# Patient Record
Sex: Male | Born: 1945 | Race: White | Hispanic: No | Smoking: Former smoker
Health system: Southern US, Community
[De-identification: ages and names within clinical notes are randomized; demographics above are authoritative.]

## PROBLEM LIST (undated history)

## (undated) DIAGNOSIS — Z7982 Long term (current) use of aspirin: Secondary | ICD-10-CM

## (undated) DIAGNOSIS — G894 Chronic pain syndrome: Secondary | ICD-10-CM

## (undated) DIAGNOSIS — I252 Old myocardial infarction: Secondary | ICD-10-CM

## (undated) DIAGNOSIS — I2510 Atherosclerotic heart disease of native coronary artery without angina pectoris: Secondary | ICD-10-CM

## (undated) DIAGNOSIS — E119 Type 2 diabetes mellitus without complications: Secondary | ICD-10-CM

## (undated) DIAGNOSIS — I214 Non-ST elevation (NSTEMI) myocardial infarction: Secondary | ICD-10-CM

## (undated) DIAGNOSIS — E1142 Type 2 diabetes mellitus with diabetic polyneuropathy: Secondary | ICD-10-CM

## (undated) DIAGNOSIS — M179 Osteoarthritis of knee, unspecified: Secondary | ICD-10-CM

## (undated) DIAGNOSIS — R0609 Other forms of dyspnea: Secondary | ICD-10-CM

## (undated) DIAGNOSIS — G4730 Sleep apnea, unspecified: Secondary | ICD-10-CM

## (undated) DIAGNOSIS — E785 Hyperlipidemia, unspecified: Secondary | ICD-10-CM

## (undated) DIAGNOSIS — Z955 Presence of coronary angioplasty implant and graft: Secondary | ICD-10-CM

## (undated) DIAGNOSIS — Z9889 Other specified postprocedural states: Secondary | ICD-10-CM

## (undated) DIAGNOSIS — L0390 Cellulitis, unspecified: Secondary | ICD-10-CM

## (undated) DIAGNOSIS — Z8489 Family history of other specified conditions: Secondary | ICD-10-CM

## (undated) DIAGNOSIS — M109 Gout, unspecified: Secondary | ICD-10-CM

## (undated) DIAGNOSIS — K631 Perforation of intestine (nontraumatic): Secondary | ICD-10-CM

## (undated) DIAGNOSIS — L600 Ingrowing nail: Secondary | ICD-10-CM

## (undated) DIAGNOSIS — K219 Gastro-esophageal reflux disease without esophagitis: Secondary | ICD-10-CM

## (undated) DIAGNOSIS — I255 Ischemic cardiomyopathy: Secondary | ICD-10-CM

## (undated) DIAGNOSIS — Z8679 Personal history of other diseases of the circulatory system: Secondary | ICD-10-CM

## (undated) DIAGNOSIS — K269 Duodenal ulcer, unspecified as acute or chronic, without hemorrhage or perforation: Secondary | ICD-10-CM

## (undated) DIAGNOSIS — I10 Essential (primary) hypertension: Secondary | ICD-10-CM

## (undated) DIAGNOSIS — M7580 Other shoulder lesions, unspecified shoulder: Secondary | ICD-10-CM

## (undated) DIAGNOSIS — R002 Palpitations: Secondary | ICD-10-CM

## (undated) DIAGNOSIS — R112 Nausea with vomiting, unspecified: Secondary | ICD-10-CM

## (undated) DIAGNOSIS — I6523 Occlusion and stenosis of bilateral carotid arteries: Secondary | ICD-10-CM

## (undated) DIAGNOSIS — Z87891 Personal history of nicotine dependence: Secondary | ICD-10-CM

## (undated) DIAGNOSIS — I5020 Unspecified systolic (congestive) heart failure: Secondary | ICD-10-CM

## (undated) DIAGNOSIS — Z789 Other specified health status: Secondary | ICD-10-CM

## (undated) DIAGNOSIS — T8859XA Other complications of anesthesia, initial encounter: Secondary | ICD-10-CM

## (undated) DIAGNOSIS — N183 Chronic kidney disease, stage 3 unspecified: Secondary | ICD-10-CM

## (undated) DIAGNOSIS — I493 Ventricular premature depolarization: Secondary | ICD-10-CM

## (undated) DIAGNOSIS — M1990 Unspecified osteoarthritis, unspecified site: Secondary | ICD-10-CM

## (undated) DIAGNOSIS — I509 Heart failure, unspecified: Secondary | ICD-10-CM

## (undated) DIAGNOSIS — I491 Atrial premature depolarization: Secondary | ICD-10-CM

## (undated) DIAGNOSIS — J449 Chronic obstructive pulmonary disease, unspecified: Secondary | ICD-10-CM

## (undated) DIAGNOSIS — M51369 Other intervertebral disc degeneration, lumbar region without mention of lumbar back pain or lower extremity pain: Secondary | ICD-10-CM

## (undated) DIAGNOSIS — M961 Postlaminectomy syndrome, not elsewhere classified: Secondary | ICD-10-CM

## (undated) HISTORY — PX: APPENDECTOMY: SHX54

## (undated) HISTORY — PX: TONSILLECTOMY: SUR1361

## (undated) HISTORY — PX: CORONARY ANGIOPLASTY WITH STENT PLACEMENT: SHX49

## (undated) HISTORY — PX: BACK SURGERY: SHX140

## (undated) HISTORY — PX: COLONOSCOPY: SHX174

## (undated) HISTORY — PX: ABDOMINAL SURGERY: SHX537

## (undated) HISTORY — PX: CHOLECYSTECTOMY: SHX55

---

## 2000-06-22 DIAGNOSIS — I2510 Atherosclerotic heart disease of native coronary artery without angina pectoris: Secondary | ICD-10-CM

## 2000-06-22 HISTORY — DX: Atherosclerotic heart disease of native coronary artery without angina pectoris: I25.10

## 2000-06-22 HISTORY — PX: CORONARY ANGIOPLASTY WITH STENT PLACEMENT: SHX49

## 2005-03-20 ENCOUNTER — Emergency Department: Payer: Self-pay | Admitting: Emergency Medicine

## 2011-02-08 DIAGNOSIS — E7800 Pure hypercholesterolemia, unspecified: Secondary | ICD-10-CM | POA: Insufficient documentation

## 2014-08-07 ENCOUNTER — Emergency Department: Payer: Self-pay | Admitting: Internal Medicine

## 2014-09-12 ENCOUNTER — Ambulatory Visit: Payer: Self-pay | Admitting: General Practice

## 2014-09-13 LAB — HEMOGLOBIN A1C: Hemoglobin A1C: 6.1 % — ABNORMAL HIGH

## 2014-09-18 DIAGNOSIS — E782 Mixed hyperlipidemia: Secondary | ICD-10-CM | POA: Diagnosis present

## 2014-09-19 DIAGNOSIS — I2510 Atherosclerotic heart disease of native coronary artery without angina pectoris: Secondary | ICD-10-CM | POA: Diagnosis present

## 2014-09-19 DIAGNOSIS — I10 Essential (primary) hypertension: Secondary | ICD-10-CM | POA: Insufficient documentation

## 2014-09-20 LAB — URINE CULTURE

## 2014-09-24 ENCOUNTER — Inpatient Hospital Stay
Admit: 2014-09-24 | Disposition: A | Discharge status: Home / Self Care | Payer: Self-pay | Attending: General Practice | Admitting: General Practice

## 2014-09-25 LAB — BASIC METABOLIC PANEL
Anion Gap: 6 — ABNORMAL LOW (ref 7–16)
BUN: 19 mg/dL
CREATININE: 1.23 mg/dL
Calcium, Total: 7.9 mg/dL — ABNORMAL LOW
Chloride: 103 mmol/L
Co2: 27 mmol/L
EGFR (African American): >60
GFR CALC NON AF AMER: 59 — AB
Glucose: 169 mg/dL — ABNORMAL HIGH
POTASSIUM: 4.1 mmol/L
Sodium: 136 mmol/L

## 2014-09-25 LAB — PLATELET COUNT: PLATELETS: 148 10*3/uL — AB (ref 150–440)

## 2014-09-25 LAB — HEMOGLOBIN: HGB: 12.8 g/dL — AB (ref 13.0–18.0)

## 2014-09-26 ENCOUNTER — Encounter
Admit: 2014-09-26 | Disposition: A | Discharge status: Home / Self Care | Payer: Self-pay | Attending: Internal Medicine | Admitting: Internal Medicine

## 2014-09-26 LAB — BASIC METABOLIC PANEL
Anion Gap: 8 (ref 7–16)
BUN: 11 mg/dL
CHLORIDE: 99 mmol/L — AB
CO2: 26 mmol/L
CREATININE: 0.96 mg/dL
Calcium, Total: 8.1 mg/dL — ABNORMAL LOW
EGFR (African American): >60
EGFR (Non-African Amer.): >60
Glucose: 162 mg/dL — ABNORMAL HIGH
Potassium: 3.8 mmol/L
Sodium: 133 mmol/L — ABNORMAL LOW

## 2014-09-26 LAB — HEMOGLOBIN: HGB: 12.1 g/dL — ABNORMAL LOW (ref 13.0–18.0)

## 2014-09-26 LAB — PLATELET COUNT: Platelet: 146 10*3/uL — ABNORMAL LOW (ref 150–440)

## 2014-10-18 LAB — CBC
HCT: 43.4 % (ref 40.0–52.0)
HGB: 14.5 g/dL (ref 13.0–18.0)
MCH: 29.6 pg (ref 26.0–34.0)
MCHC: 33.4 g/dL (ref 32.0–36.0)
MCV: 89 fL (ref 80–100)
Platelet: 211 10*3/uL (ref 150–440)
RBC: 4.9 10*6/uL (ref 4.40–5.90)
RDW: 14.7 % — ABNORMAL HIGH (ref 11.5–14.5)
WBC: 6.2 10*3/uL (ref 3.8–10.6)

## 2014-10-18 LAB — URINALYSIS, COMPLETE
Bacteria: NEGATIVE
Bilirubin,UR: NEGATIVE
Blood: NEGATIVE
Glucose,UR: NEGATIVE mg/dL (ref 0–75)
KETONE: NEGATIVE
Leukocyte Esterase: NEGATIVE
NITRITE: NEGATIVE
PROTEIN: NEGATIVE
Ph: 5 (ref 4.5–8.0)
SPECIFIC GRAVITY: 1.019 (ref 1.003–1.030)

## 2014-10-18 LAB — BASIC METABOLIC PANEL
Anion Gap: 10 (ref 7–16)
BUN: 20 mg/dL
CHLORIDE: 103 mmol/L
Calcium, Total: 9.1 mg/dL
Co2: 24 mmol/L
Creatinine: 1.26 mg/dL — ABNORMAL HIGH
EGFR (African American): >60
GFR CALC NON AF AMER: 57 — AB
Glucose: 183 mg/dL — ABNORMAL HIGH
POTASSIUM: 3.9 mmol/L
Sodium: 137 mmol/L

## 2014-10-18 LAB — APTT: Activated PTT: 33.2 secs (ref 23.6–35.9)

## 2014-10-18 LAB — SEDIMENTATION RATE: Erythrocyte Sed Rate: 2 mm/hr (ref 0–20)

## 2014-10-18 LAB — PROTIME-INR
INR: 1
Prothrombin Time: 13.6 secs

## 2014-10-18 LAB — MRSA PCR SCREENING

## 2014-10-21 NOTE — Op Note (Signed)
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   PROCEDURE PERFORMED: Left total knee arthroplasty using computer-assisted navigation.   SURGEON: Francesco SorJames Hooten, M.D.   ASSISTANT: Van ClinesJon Wolfe, PA (required to maintain retraction throughout the procedure).   ANESTHESIA: Spinal.   ESTIMATED BLOOD LOSS: 50 mL.   FLUIDS REPLACED: 1500 mL of crystalloid.   TOURNIQUET TIME: 82 minutes.   DRAINS: Two medium drains to reinfusion system.   SOFT TISSUE RELEASES: Anterior cruciate ligament, posterior cruciate ligament, deep medial collateral ligament and patellofemoral ligament.   IMPLANTS UTILIZED: DePuy PFC Sigma size 4 posterior stabilized femoral component (cemented), size 5 MBT tibial component (cemented), 38 mm three-peg oval dome patella (cemented), and a 10 mm stabilized rotating platform polyethylene insert.   INDICATIONS FOR SURGERY: The patient is a 69 year old male who has been seen for complaints of progressive left knee pain. X-rays demonstrated severe degenerative changes in tricompartmental fashion with relative varus deformity. After discussion of the risks and benefits of surgical intervention, the patient expressed understanding of the risks and benefits and agreed with plans for surgical intervention.   PROCEDURE IN DETAIL: The patient was brought into the operating room and, after adequate spinal anesthesia was achieved, a tourniquet was placed on the patient's upper left thigh. The patient's left knee and leg were cleaned and prepped with alcohol and DuraPrep and draped in the usual sterile fashion. A "timeout" was performed as per usual protocol. The left lower extremity was exsanguinated using an Esmarch, and the tourniquet was inflated to 300 mmHg. An anterior  longitudinal incision was made followed by a standard mid vastus approach. A small effusion was evacuated. The deep fibers of the medial collateral ligament were elevated in subperiosteal fashion off the medial flare of the tibia so as to maintain a continuous soft tissue sleeve. The patella was subluxed laterally and the patellofemoral ligament was incised. Inspection of the knee demonstrated severe degenerative changes with areas of full-thickness loss of articular cartilage to the medial compartment. Prominent osteophytes were debrided using a rongeur. Anterior and posterior cruciate ligaments were excised. Two 4.0 mm Schanz pins were inserted into the femur and into the tibia for attachment of the array of trackers used for computer-assisted navigation. Hip center was identified using the circumduction technique. Distal landmarks were mapped using the computer. The distal femur and proximal tibia were mapped using the computer. Distal femoral cutting guide was positioned using computer-assisted navigation so as to achieve a 5 degree distal valgus cut. Cut was performed and verified using the computer. Distal femur was sized and it was felt that a size 4 femoral component was appropriate. A size 4 cutting guide was positioned and the anterior cut was performed and verified using the computer. This was followed by completion of posterior and chamfer cuts. Femoral cutting guide for the central box was then positioned and the central box cut was performed. Attention was then directed to the proximal tibia. Medial and lateral menisci were excised. The extramedullary tibial cutting guide was positioned using computer-assisted navigation so as to achieve a 0 degree varus valgus alignment and 0 degree posterior slope. Cut was performed and verified using the computer. The proximal tibia was sized and it was felt that a size 5 tibial tray was appropriate. Tibial and femoral trials were inserted followed by insertion  of a  10 mm polyethylene insert. This allowed for excellent mediolateral soft tissue balancing both in flexion and in full extension. Finally, the patella was cut and prepared so as to accommodate a 38 mm three-peg oval dome patella. Patellar trial was placed and the knee was placed through a range of motion with excellent patellar tracking appreciated. The femoral trial was removed after debridement of posterior osteophytes. Central post hole for the tibial component was reamed followed by insertion of a keel punch. Tibial trial was then removed. The cut surfaces of bone were irrigated with copious amounts of normal saline with antibiotic solution using pulsatile lavage and then suctioned dry. Polymethylmethacrylate cement with gentamicin was prepared in the usual fashion using a vacuum mixer. Cement was applied to the cut surface of the proximal tibia as well as along the undersurface of a size 5 MBT tibial component. The tibial component was positioned and impacted into place. Excess cement was removed using freer elevators. Cement was then applied to the cut surface of the femur as well as on the posterior flanges of a size 4 posterior stabilized femoral component. Femoral component was positioned and impacted into place. Excess cement was removed using freer elevators. A 10 mm polyethylene trial was inserted and the knee was brought into full extension with steady axial compression applied. Finally, cement was applied to the backside of a 38 mm three-peg oval dome patella, and the patellar component was positioned and patellar clamp applied. Excess cement was removed using freer elevators.   After adequate curing of the cement, the tourniquet was deflated after total tourniquet time of 82 minutes. Hemostasis was achieved using electrocautery. The knee was irrigated with copious amounts of normal saline with antibiotic solution using pulsatile lavage and then suctioned dry. The knee was inspected for any residual  cement debris. Then, 20 mL of 1.3% Exparel in 40 mL of normal saline was injected along the posterior capsule, medial and lateral gutters, and along the arthrotomy site. A 10 mm stabilized rotating platform polyethylene insert was inserted and the knee was placed through a range of motion with excellent patellar tracking appreciated and excellent mediolateral soft tissue tracking appreciated, both in flexion and in full extension. Two medium drains were placed in the wound bed and brought out through a separate stab incision to be attached to a reinfusion system. The medial parapatellar portion of the incision was reapproximated using interrupted sutures of #1 Vicryl. The subcutaneous tissue was injected with 30 mL of 0.25% Marcaine with epinephrine. The subcutaneous tissue was then reapproximated in layers using first #0 Vicryl followed by #2-0 Vicryl. Skin was closed with skin staples. A sterile dressing was applied.   The patient tolerated the procedure well. He was transported to the recovery room in stable condition.   ____________________________ Illene Labrador. Angie Fava., MD jph:sb D: 09/24/2014 11:06:14 ET T: 09/24/2014 11:25:41 ET JOB#: [id number]  cc: Fayrene Fearing P. Angie Fava., MD, <Dictator> JAMES P Angie Fava MD ELECTRONICALLY SIGNED 09/29/2014 10:02

## 2014-10-21 NOTE — Discharge Summary (Signed)
PATIENT NAME:  Aaron [name], Aaron [name] MR#:  [id number] DATE OF BIRTH:  06/07/1946  DATE OF ADMISSION:  09/24/2014 DATE OF DISCHARGE:  09/27/2014  ADMITTING DIAGNOSIS: Degenerative arthrosis of the left knee.   DISCHARGE DIAGNOSIS: Degenerative arthrosis of the right knee.   HISTORY: The patient is a 69 year old gentleman who has been followed at [facility] for progression of left knee pain. The patient reported a several year history of bilateral knee pain with the left being more symptomatic than the right. He had not seen any significant improvement in his condition despite anti-inflammatory medications, intra-articular cortisone injections, as well as a series of Synvisc injections. The patient localized most of the pain along the medial aspect of the knee. His pain was aggravated with weight-bearing activities. The patient also reported some startup stiffness as well as activity-related swelling. At the time of surgery, he was not using any ambulatory aid. The patient states that the pain had increased to the point that it was significantly interfering with his activities of daily living. X-rays taken in [facility] showed narrowing of the medial cartilage space with bone-on-bone articulation being noted. He was also noted to be in a slight varus alignment. Osteophyte and subchondral sclerosis were noted. After discussion of the risks and benefits of surgical intervention, the patient expressed his understanding of the risks and benefits and agreed for surgical intervention.   PROCEDURE: Left total knee arthroplasty using computer-assisted navigation.   ANESTHESIA: Spinal.   SOFT TISSUE RELEASE: Anterior cruciate ligament, posterior cruciate ligament, deep medial collateral ligaments, as well as patellofemoral ligament.   IMPLANTS UTILIZED:  DePuy PFC Sigma size 4 posterior stabilized femoral component, size 5 MBT tibial component (cemented), 38 mm 3 pegged oval dome patella (cemented), and a  10 mm stabilized rotating platform polyethylene insert.   HOSPITAL COURSE: The patient tolerated the procedure very well. He had no complications. He was then taken to the PACU where he was stabilized and then transferred to the orthopedic floor. He began receiving anticoagulation therapy of Lovenox 30 mg subcutaneous q. 12 hours per anesthesia and pharmacy protocol. He was treated with TED stockings bilaterally. These were allowed to be removed 1 hour per 8 hour shift. The left one was final day 2 following removal of Hemovac and dressing change. The patient was also fitted with AVI compression foot pumps bilaterally set at 80 mmHg.  His calves have been nontender. There has been no evidence of any DVTs. Negative Homan sign. Heels were elevated off the bed using rolled towels.   The patient has denied any chest pains or shortness of breath. Vital signs have been stable. He has been afebrile. Hemodynamically, he was stable and no transfusions were given other than the Autovac transfusion given the first 6 hours postoperatively.   Physical therapy was initiated on day 1 for gait training and transfers. He has done very well. Upon being discharged, he was ambulating greater than 200 feet. He was independent with bed to chair transfers.  Was able to go up 4 steps.  Occupational therapy was also initiated on day 1 for activities of daily living and assistive devices.   The patient's IV, Foley, and Hemovac were discontinued on day 2 along with a dressing change. The wound was free of any drainage or signs of infection. Polar Care was reapplied to the surgical leg maintaining a temperature of 40 to 50 degrees Fahrenheit.   The patient is being discharged to home in improved stable condition. He may continue weightbearing  as tolerated. Continue using a rolling walker until cleared by physical therapy to go to a quad cane. He was instructed in elevation of the lower extremity. He is to continue with TED stockings  bilaterally. These are to be worn during the day, but may be removed at night. Recommend that he continue using incentive spirometer every 1 hour while awake. Encourage cough and deep breathing every 2 hours while awake. He is placed on an ADA diet. Continue Polar Care. Recommend wearing this 24 hours a day as much as he can for the first 2 weeks. He is to maintain a temperature of 40 to 50 degrees Fahrenheit. He was instructed on wound care. [facility] on April 19 at 9:15. Call sooner if any complications or any fevers over 101.5.   The patient may resume his regular medication he was on prior to admission. He was given a prescription for Roxicodone 5 to 10 mg every 4 to 6 hours p.r.n. for pain, tramadol 50 to 100 mg every 4 to 6 hours p.r.n. for pain and Lovenox 40 mg subcutaneously daily for 14 days, then discontinue and begin taking 181 mg enteric-coated aspirin per day.   PAST MEDICAL HISTORY: Gout, gastroesophageal reflux disease, hyperlipidemia. Hypertension, diabetes type 2, coronary artery disease.   ____________________________ Van ClinesJon Hailea Eaglin, PA jrw:sp D: 09/27/2014 07:47:40 ET T: 09/27/2014 11:54:18 ET JOB#: [id number]  cc: Van ClinesJon Brolin Dambrosia, PA, <Dictator> Authur Cubit PA ELECTRONICALLY SIGNED 09/29/2014 9:50

## 2015-02-24 ENCOUNTER — Emergency Department
Admission: EM | Admit: 2015-02-24 | Discharge: 2015-02-24 | Disposition: A | Discharge status: Home / Self Care | Payer: Medicare Other | Attending: Emergency Medicine | Admitting: Emergency Medicine

## 2015-02-24 ENCOUNTER — Encounter: Payer: Self-pay | Admitting: Adult Health

## 2015-02-24 ENCOUNTER — Emergency Department: Payer: Medicare Other

## 2015-02-24 DIAGNOSIS — E119 Type 2 diabetes mellitus without complications: Secondary | ICD-10-CM | POA: Diagnosis not present

## 2015-02-24 DIAGNOSIS — Z87891 Personal history of nicotine dependence: Secondary | ICD-10-CM | POA: Insufficient documentation

## 2015-02-24 DIAGNOSIS — R55 Syncope and collapse: Secondary | ICD-10-CM | POA: Insufficient documentation

## 2015-02-24 DIAGNOSIS — Z79899 Other long term (current) drug therapy: Secondary | ICD-10-CM | POA: Diagnosis not present

## 2015-02-24 DIAGNOSIS — R42 Dizziness and giddiness: Secondary | ICD-10-CM

## 2015-02-24 HISTORY — DX: Old myocardial infarction: I25.2

## 2015-02-24 HISTORY — DX: Type 2 diabetes mellitus without complications: E11.9

## 2015-02-24 LAB — URINALYSIS COMPLETE WITH MICROSCOPIC (ARMC ONLY)
Bacteria, UA: NONE SEEN
Bilirubin Urine: NEGATIVE
Glucose, UA: NEGATIVE mg/dL
Hgb urine dipstick: NEGATIVE
Ketones, ur: NEGATIVE mg/dL
Leukocytes, UA: NEGATIVE
Nitrite: NEGATIVE
PH: 5 (ref 5.0–8.0)
PROTEIN: NEGATIVE mg/dL
RBC / HPF: NONE SEEN RBC/hpf (ref 0–5)
SPECIFIC GRAVITY, URINE: 1.011 (ref 1.005–1.030)
Squamous Epithelial / LPF: NONE SEEN

## 2015-02-24 LAB — BASIC METABOLIC PANEL
ANION GAP: 5 (ref 5–15)
BUN: 21 mg/dL — ABNORMAL HIGH (ref 6–20)
CHLORIDE: 107 mmol/L (ref 101–111)
CO2: 25 mmol/L (ref 22–32)
Calcium: 9.2 mg/dL (ref 8.9–10.3)
Creatinine, Ser: 1.14 mg/dL (ref 0.61–1.24)
GFR calc non Af Amer: >60 mL/min (ref >60)
GLUCOSE: 211 mg/dL — AB (ref 65–99)
Potassium: 4 mmol/L (ref 3.5–5.1)
Sodium: 137 mmol/L (ref 135–145)

## 2015-02-24 LAB — CBC
HEMATOCRIT: 41 % (ref 40.0–52.0)
HEMOGLOBIN: 14 g/dL (ref 13.0–18.0)
MCH: 29.4 pg (ref 26.0–34.0)
MCHC: 34 g/dL (ref 32.0–36.0)
MCV: 86.5 fL (ref 80.0–100.0)
Platelets: 179 10*3/uL (ref 150–440)
RBC: 4.75 MIL/uL (ref 4.40–5.90)
RDW: 15.6 % — ABNORMAL HIGH (ref 11.5–14.5)
WBC: 6.7 10*3/uL (ref 3.8–10.6)

## 2015-02-24 LAB — TROPONIN I
Troponin I: 0.03 ng/mL (ref <0.031)
Troponin I: <0.03 ng/mL (ref <0.031)

## 2015-02-24 LAB — GLUCOSE, CAPILLARY: Glucose-Capillary: 211 mg/dL — ABNORMAL HIGH (ref 65–99)

## 2015-02-24 NOTE — ED Notes (Addendum)
Presents with dizziness and weakness for the past 2-3 days-per EMS CBG was 240. Pt states, "I don't feel right, I got a little headache, I felt good when I woke up this AM, I fell asleep in a recliner and when I got up and got dressed, and when I bent over to put my shoes on everything went off balance, I felt like I was going to pass out and I felt sick. I got to feeling a little better and got my shoes on, started going to my sons house and made it halfway before I got dizzy and light headed and nauseated. If I am sitting still and not moving its not bad, but if I move just a little bit I feel dizzy" denies pain, denies SOB, endorses nausea, light headedness and diaphoresis. -CBG here 211, he states it normally runs around 150-160

## 2015-02-24 NOTE — ED Provider Notes (Signed)
[facility] Emergency Department Provider Note  ____________________________________________  Time seen: Approximately 12:30 PM  I have reviewed the triage vital signs and the nursing notes.   HISTORY  Chief Complaint Near Syncope    HPI Aaron Maden. is a 69 y.o. male he should return reports he bent over to tie his shoes and then became all lightheaded dizzy and sweaty. This continued on for some time getting worse every time he moves it would improve if he did not move His head still. Patient reports he did something similar to this several years ago was called in her ear problem. He says however that he did not get sweaty when he had this happened. He had nausea along with it but no vomiting. He did not have any chest tightness shortness of breath or other symptoms. At the present time he feels back to normal except for occasionally when he stands up or moves he gets a little lightheaded or dizzy just for a second or less   Past Medical History  Diagnosis Date  . MI, old   . Diabetes mellitus without complication    patient reports his family doctors take him off of all diabetes medicines as his hemoglobin A1c has been 6 following a diet changes  There are no active problems to display for this patient.   Past Surgical History  Procedure Laterality Date  . Coronary angioplasty with stent placement    . Abdominal surgery     past surgical history includes back surgery for ruptured disc in the abdominal surgery was for "ruptured stomach"  Current Outpatient Rx  Name  Route  Sig  Dispense  Refill  . allopurinol (ZYLOPRIM) 100 MG tablet   Oral   Take 100 mg by mouth daily as needed. For gout flare up.      11   . augmented betamethasone dipropionate (DIPROLENE-AF) 0.05 % cream   Topical   Apply 1 application topically 2 (two) times daily as needed. For rawness behind ears.      2   . Flaxseed MISC   Oral   Take 2,400 mg by mouth every  other day.         . indomethacin (INDOCIN) 50 MG capsule   Oral   Take 50 mg by mouth daily as needed. For gout flare up.         Marland Kitchen ketoconazole (NIZORAL) 2 % shampoo   Topical   Apply 1 application topically See admin instructions. Apply shampoo 2 to 3 times a week as needed. Leave on 5-10 minutes.      5   . nitroGLYCERIN (NITROSTAT) 0.4 MG SL tablet   Sublingual   Place 0.4 mg under the tongue every 5 (five) minutes x 3 doses as needed. For chest pain. If no relief Call md or go to emergency room.         Marland Kitchen omeprazole (PRILOSEC) 20 MG capsule   Oral   Take 20 mg by mouth daily as needed. When taking Indomethacin or as needed for gerd.      3   . traMADol (ULTRAM) 50 MG tablet   Oral   Take 50 mg by mouth 2 (two) times daily as needed. for pain      3     Allergies Statins and Sulfa antibiotics  History reviewed. No pertinent family history.  Social History Social History  Substance Use Topics  . Smoking status: Former Games developer  . Smokeless tobacco: None  .  Alcohol Use: No    Review of Systems Constitutional: No fever/chills Eyes: No visual changes. ENT: No sore throat. Cardiovascular: Denies chest pain. Respiratory: Denies shortness of breath. Gastrointestinal: No abdominal pain.  No nausea, no vomiting.  No diarrhea.  No constipation. Genitourinary: Negative for dysuria. Musculoskeletal: Negative for back pain. Skin: Negative for rash. Neurological: Negative for headaches, focal weakness or numbness.  10-point ROS otherwise negative.  ____________________________________________   PHYSICAL EXAM:  VITAL SIGNS: ED Triage Vitals  Enc Vitals Group     BP 02/24/15 1130 149/63 mmHg     Pulse Rate 02/24/15 1130 82     Resp 02/24/15 1130 16     Temp 02/24/15 1130 98.4 F (36.9 C)     Temp Source 02/24/15 1130 Oral     SpO2 02/24/15 1130 96 %     Weight --      Height --      Head Cir --      Peak Flow --      Pain Score --      Pain Loc --       Pain Edu? --      Excl. in GC? --     Constitutional: Alert and oriented. Well appearing and in no acute distress. Eyes: Conjunctivae are normal. PERRL. EOMI. Head: Atraumatic. Nose: No congestion/rhinnorhea. Mouth/Throat: Mucous membranes are moist.  Oropharynx non-erythematous. Neck: No stridor.  Cardiovascular: Normal rate, regular rhythm. Grossly normal heart sounds.  Good peripheral circulation. Respiratory: Normal respiratory effort.  No retractions. Lungs CTAB. Gastrointestinal: Soft and nontender. No distention. No abdominal bruits. No CVA tenderness. Musculoskeletal: No lower extremity tenderness nor edema.  No joint effusions. Neurologic:  Normal speech and language. No gross focal neurologic deficits are appreciated. No gait instability. Cranial nerves II through 12 are intact. Cerebellar finger to nose heel-to-shin and rapid alternating movements and hands are normal motor strength is 5 over 5 throughout sensation is intact. There is no nystagmus. Head thrust is normal Skin:  Skin is warm, dry and intact. No rash noted. Psychiatric: Mood and affect are normal. Speech and behavior are normal.  ____________________________________________   LABS (all labs ordered are listed, but only abnormal results are displayed)  Labs Reviewed  BASIC METABOLIC PANEL - Abnormal; Notable for the following:    Glucose, Bld 211 (*)    BUN 21 (*)    All other components within normal limits  CBC - Abnormal; Notable for the following:    RDW 15.6 (*)    All other components within normal limits  URINALYSIS COMPLETEWITH MICROSCOPIC ([facility] ONLY) - Abnormal; Notable for the following:    Color, Urine STRAW (*)    APPearance CLEAR (*)    All other components within normal limits  GLUCOSE, CAPILLARY - Abnormal; Notable for the following:    Glucose-Capillary 211 (*)    All other components within normal limits  TROPONIN I  TROPONIN I  CBG MONITORING, ED    ____________________________________________  EKG  KG read and interpreted by me shows normal sinus rhythm at a rate of 82 left axis essentially normal EKG ____________________________________________  RADIOLOGY No acute changes per radiology  ____________________________________________   PROCEDURES   ____________________________________________   INITIAL IMPRESSION / ASSESSMENT AND PLAN / ED COURSE  Pertinent labs & imaging results that were available during my care of the patient were reviewed by me and considered in my medical decision making (see chart for details).   ____________________________________________   FINAL CLINICAL IMPRESSION(S) / ED DIAGNOSES  Final diagnoses:  Vertigo      Arnaldo Natal, MD 02/24/15 [phone]

## 2015-02-24 NOTE — Discharge Instructions (Signed)
Dizziness °Dizziness is a common problem. It is a feeling of unsteadiness or light-headedness. You may feel like you are about to faint. Dizziness can lead to injury if you stumble or fall. A person of any age group can suffer from dizziness, but dizziness is more common in older adults. °CAUSES  °Dizziness can be caused by many different things, including: °· Middle ear problems. °· Standing for too long. °· Infections. °· An allergic reaction. °· Aging. °· An emotional response to something, such as the sight of blood. °· Side effects of medicines. °· Tiredness. °· Problems with circulation or blood pressure. °· Excessive use of alcohol or medicines, or illegal drug use. °· Breathing too fast (hyperventilation). °· An irregular heart rhythm (arrhythmia). °· A low red blood cell count (anemia). °· Pregnancy. °· Vomiting, diarrhea, fever, or other illnesses that cause body fluid loss (dehydration). °· Diseases or conditions such as Parkinson's disease, high blood pressure (hypertension), diabetes, and thyroid problems. °· Exposure to extreme heat. °DIAGNOSIS  °Your health care provider will ask about your symptoms, perform a physical exam, and perform an electrocardiogram (ECG) to record the electrical activity of your heart. Your health care provider may also perform other heart or blood tests to determine the cause of your dizziness. These may include: °· Transthoracic echocardiogram (TTE). During echocardiography, sound waves are used to evaluate how blood flows through your heart. °· Transesophageal echocardiogram (TEE). °· Cardiac monitoring. This allows your health care provider to monitor your heart rate and rhythm in real time. °· Holter monitor. This is a portable device that records your heartbeat and can help diagnose heart arrhythmias. It allows your health care provider to track your heart activity for several days if needed. °· Stress tests by exercise or by giving medicine that makes the heart beat  faster. °TREATMENT  °Treatment of dizziness depends on the cause of your symptoms and can vary greatly. °HOME CARE INSTRUCTIONS  °· Drink enough fluids to keep your urine clear or pale yellow. This is especially important in very hot weather. In older adults, it is also important in cold weather. °· Take your medicine exactly as directed if your dizziness is caused by medicines. When taking blood pressure medicines, it is especially important to get up slowly. °¨ Rise slowly from chairs and steady yourself until you feel okay. °¨ In the morning, first sit up on the side of the bed. When you feel okay, stand slowly while holding onto something until you know your balance is fine. °· Move your legs often if you need to stand in one place for a long time. Tighten and relax your muscles in your legs while standing. °· Have someone stay with you for 1-2 days if dizziness continues to be a problem. Do this until you feel you are well enough to stay alone. Have the person call your health care provider if he or she notices changes in you that are concerning. °· Do not drive or use heavy machinery if you feel dizzy. °· Do not drink alcohol. °SEEK IMMEDIATE MEDICAL CARE IF:  °· Your dizziness or light-headedness gets worse. °· You feel nauseous or vomit. °· You have problems talking, walking, or using your arms, hands, or legs. °· You feel weak. °· You are not thinking clearly or you have trouble forming sentences. It may take a friend or family member to notice this. °· You have chest pain, abdominal pain, shortness of breath, or sweating. °· Your vision changes. °· You notice   any bleeding.  You have side effects from medicine that seems to be getting worse rather than better. MAKE SURE YOU:   Understand these instructions.  Will watch your condition.  Will get help right away if you are not doing well or get worse. Document Released: 12/02/2000 Document Revised: 06/13/2013 Document Reviewed: 12/26/2010 [facility]  Patient Information 2015 [place], [place]. This information is not intended to replace advice given to you by your health care provider. Make sure you discuss any questions you have with your health care provider.   Please return if you get worse, especially if the symptoms do not resolve in 5-10 minutes.   I would like you to follow up with the cardiologist since she had that episodes of sweating and your troponin the heart enzyme had bumped up slightly before returning back to normal. Please call his office on Tuesday to set up a follow-up appointment in the next few days.

## 2015-09-30 DIAGNOSIS — I6523 Occlusion and stenosis of bilateral carotid arteries: Secondary | ICD-10-CM | POA: Insufficient documentation

## 2015-09-30 DIAGNOSIS — Z96652 Presence of left artificial knee joint: Secondary | ICD-10-CM | POA: Insufficient documentation

## 2015-10-21 ENCOUNTER — Encounter: Payer: Self-pay | Admitting: *Deleted

## 2015-10-21 ENCOUNTER — Encounter
Admission: RE | Disposition: A | Discharge status: Home / Self Care | Payer: Self-pay | Source: Ambulatory Visit | Attending: Cardiology

## 2015-10-21 ENCOUNTER — Observation Stay
Admission: RE | Admit: 2015-10-21 | Discharge: 2015-10-22 | Disposition: A | Discharge status: Home / Self Care | Payer: Medicare Other | Source: Ambulatory Visit | Attending: Cardiology | Admitting: Cardiology

## 2015-10-21 DIAGNOSIS — I10 Essential (primary) hypertension: Secondary | ICD-10-CM | POA: Diagnosis not present

## 2015-10-21 DIAGNOSIS — M109 Gout, unspecified: Secondary | ICD-10-CM | POA: Insufficient documentation

## 2015-10-21 DIAGNOSIS — E785 Hyperlipidemia, unspecified: Secondary | ICD-10-CM | POA: Diagnosis not present

## 2015-10-21 DIAGNOSIS — I25119 Atherosclerotic heart disease of native coronary artery with unspecified angina pectoris: Principal | ICD-10-CM | POA: Insufficient documentation

## 2015-10-21 DIAGNOSIS — E119 Type 2 diabetes mellitus without complications: Secondary | ICD-10-CM | POA: Insufficient documentation

## 2015-10-21 DIAGNOSIS — K219 Gastro-esophageal reflux disease without esophagitis: Secondary | ICD-10-CM | POA: Diagnosis not present

## 2015-10-21 DIAGNOSIS — Z955 Presence of coronary angioplasty implant and graft: Secondary | ICD-10-CM | POA: Insufficient documentation

## 2015-10-21 DIAGNOSIS — Z8042 Family history of malignant neoplasm of prostate: Secondary | ICD-10-CM | POA: Diagnosis not present

## 2015-10-21 DIAGNOSIS — Z96652 Presence of left artificial knee joint: Secondary | ICD-10-CM | POA: Diagnosis not present

## 2015-10-21 DIAGNOSIS — Z8379 Family history of other diseases of the digestive system: Secondary | ICD-10-CM | POA: Diagnosis not present

## 2015-10-21 DIAGNOSIS — I2510 Atherosclerotic heart disease of native coronary artery without angina pectoris: Secondary | ICD-10-CM | POA: Diagnosis present

## 2015-10-21 DIAGNOSIS — I200 Unstable angina: Secondary | ICD-10-CM | POA: Diagnosis present

## 2015-10-21 DIAGNOSIS — E7800 Pure hypercholesterolemia, unspecified: Secondary | ICD-10-CM | POA: Diagnosis not present

## 2015-10-21 DIAGNOSIS — Z9049 Acquired absence of other specified parts of digestive tract: Secondary | ICD-10-CM | POA: Insufficient documentation

## 2015-10-21 DIAGNOSIS — Z8249 Family history of ischemic heart disease and other diseases of the circulatory system: Secondary | ICD-10-CM | POA: Insufficient documentation

## 2015-10-21 DIAGNOSIS — Z87891 Personal history of nicotine dependence: Secondary | ICD-10-CM | POA: Insufficient documentation

## 2015-10-21 DIAGNOSIS — Z882 Allergy status to sulfonamides status: Secondary | ICD-10-CM | POA: Insufficient documentation

## 2015-10-21 DIAGNOSIS — Z7982 Long term (current) use of aspirin: Secondary | ICD-10-CM | POA: Diagnosis not present

## 2015-10-21 DIAGNOSIS — Z79899 Other long term (current) drug therapy: Secondary | ICD-10-CM | POA: Diagnosis not present

## 2015-10-21 DIAGNOSIS — R079 Chest pain, unspecified: Secondary | ICD-10-CM | POA: Diagnosis present

## 2015-10-21 DIAGNOSIS — Z888 Allergy status to other drugs, medicaments and biological substances status: Secondary | ICD-10-CM | POA: Insufficient documentation

## 2015-10-21 HISTORY — PX: CARDIAC CATHETERIZATION: SHX172

## 2015-10-21 HISTORY — DX: Cellulitis, unspecified: L03.90

## 2015-10-21 SURGERY — LEFT HEART CATH AND CORONARY ANGIOGRAPHY
Anesthesia: Moderate Sedation

## 2015-10-21 MED ORDER — ASPIRIN EC 325 MG PO TBEC
325.0000 mg | DELAYED_RELEASE_TABLET | Freq: Every day | ORAL | Status: DC
  Administered 2015-10-22: 325 mg via ORAL
  Filled 2015-10-21: qty 1

## 2015-10-21 MED ORDER — CLOPIDOGREL BISULFATE 75 MG PO TABS
75.0000 mg | ORAL_TABLET | Freq: Every day | ORAL | Status: DC
  Administered 2015-10-22: 75 mg via ORAL
  Filled 2015-10-21: qty 1

## 2015-10-21 MED ORDER — FENTANYL CITRATE (PF) 100 MCG/2ML IJ SOLN
INTRAMUSCULAR | Status: DC | PRN
  Administered 2015-10-21 (×2): 25 ug via INTRAVENOUS

## 2015-10-21 MED ORDER — MIDAZOLAM HCL 2 MG/2ML IJ SOLN
INTRAMUSCULAR | Status: DC | PRN
  Administered 2015-10-21 (×2): 1 mg via INTRAVENOUS

## 2015-10-21 MED ORDER — MIDAZOLAM HCL 2 MG/2ML IJ SOLN
INTRAMUSCULAR | Status: AC
  Filled 2015-10-21: qty 2

## 2015-10-21 MED ORDER — NITROGLYCERIN 1 MG/10 ML FOR IR/CATH LAB
INTRA_ARTERIAL | Status: DC | PRN
  Administered 2015-10-21 (×2): 200 ug via INTRACORONARY

## 2015-10-21 MED ORDER — IOPAMIDOL (ISOVUE-300) INJECTION 61%
INTRAVENOUS | Status: DC | PRN
  Administered 2015-10-21: 270 mL via INTRAVENOUS

## 2015-10-21 MED ORDER — CLOPIDOGREL BISULFATE 75 MG PO TABS
ORAL_TABLET | ORAL | Status: AC
  Filled 2015-10-21: qty 8

## 2015-10-21 MED ORDER — ACETAMINOPHEN 325 MG PO TABS: 650.0000 mg | ORAL_TABLET | ORAL | Status: DC | PRN

## 2015-10-21 MED ORDER — ASPIRIN 81 MG PO CHEW
CHEWABLE_TABLET | ORAL | Status: AC
  Filled 2015-10-21: qty 4

## 2015-10-21 MED ORDER — FENTANYL CITRATE (PF) 100 MCG/2ML IJ SOLN
INTRAMUSCULAR | Status: AC
  Filled 2015-10-21: qty 2

## 2015-10-21 MED ORDER — BIVALIRUDIN BOLUS VIA INFUSION - CUPID
INTRAVENOUS | Status: DC | PRN
  Administered 2015-10-21: 85.05 mg via INTRAVENOUS

## 2015-10-21 MED ORDER — BIVALIRUDIN 250 MG IV SOLR
INTRAVENOUS | Status: AC
  Filled 2015-10-21: qty 250

## 2015-10-21 MED ORDER — NITROGLYCERIN 5 MG/ML IV SOLN
INTRAVENOUS | Status: AC
  Filled 2015-10-21: qty 10

## 2015-10-21 MED ORDER — SODIUM CHLORIDE 0.9 % IV SOLN: 250.0000 mL | INTRAVENOUS | Status: DC | PRN

## 2015-10-21 MED ORDER — SODIUM CHLORIDE 0.9% FLUSH
3.0000 mL | Freq: Two times a day (BID) | INTRAVENOUS | Status: DC
  Administered 2015-10-21: 3 mL via INTRAVENOUS

## 2015-10-21 MED ORDER — ONDANSETRON HCL 4 MG/2ML IJ SOLN: 4.0000 mg | Freq: Four times a day (QID) | INTRAMUSCULAR | Status: DC | PRN

## 2015-10-21 MED ORDER — HEPARIN (PORCINE) IN NACL 2-0.9 UNIT/ML-% IJ SOLN
INTRAMUSCULAR | Status: AC
  Filled 2015-10-21: qty 500

## 2015-10-21 MED ORDER — ASPIRIN 325 MG PO TABS
ORAL_TABLET | ORAL | Status: DC | PRN
Start: 2015-10-21 — End: 2015-10-21

## 2015-10-21 MED ORDER — BIVALIRUDIN 250 MG IV SOLR
250.0000 mg | INTRAVENOUS | Status: DC | PRN
  Administered 2015-10-21: 1.75 mg/kg/h via INTRAVENOUS

## 2015-10-21 MED ORDER — SODIUM CHLORIDE 0.9 % WEIGHT BASED INFUSION: 3.0000 mL/kg/h | INTRAVENOUS | Status: AC

## 2015-10-21 MED ORDER — SODIUM CHLORIDE 0.9% FLUSH
10.0000 mL | Freq: Three times a day (TID) | INTRAVENOUS | Status: DC
  Administered 2015-10-21: 10 mL via INTRAVENOUS

## 2015-10-21 MED ORDER — ASPIRIN 81 MG PO CHEW
CHEWABLE_TABLET | ORAL | Status: DC | PRN
  Administered 2015-10-21: 324 mg via ORAL

## 2015-10-21 MED ORDER — SODIUM CHLORIDE 0.9% FLUSH: 3.0000 mL | INTRAVENOUS | Status: DC | PRN

## 2015-10-21 MED ORDER — CLOPIDOGREL BISULFATE 75 MG PO TABS
ORAL_TABLET | ORAL | Status: DC | PRN
  Administered 2015-10-21: 600 mg via ORAL

## 2015-10-21 MED ORDER — SODIUM CHLORIDE 0.9 % IV SOLN
INTRAVENOUS | Status: DC
  Administered 2015-10-21: 11:00:00 via INTRAVENOUS

## 2015-10-21 SURGICAL SUPPLY — 16 items
BALLN TREK RX 3.0X20 (BALLOONS) ×4
BALLOON TREK RX 3.0X20 (BALLOONS) ×2 IMPLANT
CATH INFINITI 5FR ANG PIGTAIL (CATHETERS) ×4 IMPLANT
CATH INFINITI 5FR JL4 (CATHETERS) ×4 IMPLANT
CATH INFINITI JR4 5F (CATHETERS) ×4 IMPLANT
CATH VISTA GUIDE 6FR JR4 (CATHETERS) ×4 IMPLANT
DEVICE CLOSURE MYNXGRIP 6/7F (Vascular Products) ×4 IMPLANT
DEVICE INFLAT 30 PLUS (MISCELLANEOUS) ×4 IMPLANT
KIT MANI 3VAL PERCEP (MISCELLANEOUS) ×4 IMPLANT
NEEDLE PERC 18GX7CM (NEEDLE) ×4 IMPLANT
PACK CARDIAC CATH (CUSTOM PROCEDURE TRAY) ×4 IMPLANT
SHEATH AVANTI 6FR X 11CM (SHEATH) ×4 IMPLANT
SHEATH PINNACLE 5F 10CM (SHEATH) ×4 IMPLANT
STENT XIENCE ALPINE RX 3.5X28 (Permanent Stent) ×4 IMPLANT
WIRE ASAHI PROWATER 180CM (WIRE) ×4 IMPLANT
WIRE EMERALD 3MM-J .035X150CM (WIRE) ×4 IMPLANT

## 2015-10-21 NOTE — Progress Notes (Signed)
Pt. admitted to unit, [id number] from cath lab[facility] Oriented to room, call bell, Ascom phones and staff. Bed in low position. Fall safety plan reviewed, yellow non-skid socks in place, bed alarm on. Full assessment to Epic; skin assessed with Leighton ParodyKathy S., RN. Telemetry box verified with tele clerk and Schuyler AmorKeon Summers NT: [id number] . Will continue to monitor.

## 2015-10-21 NOTE — Progress Notes (Signed)
[facility]  Check right groin for bleeding or hematoma.  Patient will be on bedrest for 2 hours post sheath pull---out of bed at 15:35.  Bilateral pulses are 2's DP's..Marland Kitchen

## 2015-10-22 ENCOUNTER — Encounter: Payer: Self-pay | Admitting: Internal Medicine

## 2015-10-22 DIAGNOSIS — I25119 Atherosclerotic heart disease of native coronary artery with unspecified angina pectoris: Secondary | ICD-10-CM | POA: Diagnosis not present

## 2015-10-22 LAB — CBC
HEMATOCRIT: 40.9 % (ref 40.0–52.0)
Hemoglobin: 14 g/dL (ref 13.0–18.0)
MCH: 29.7 pg (ref 26.0–34.0)
MCHC: 34.3 g/dL (ref 32.0–36.0)
MCV: 86.6 fL (ref 80.0–100.0)
PLATELETS: 186 10*3/uL (ref 150–440)
RBC: 4.73 MIL/uL (ref 4.40–5.90)
RDW: 15.1 % — AB (ref 11.5–14.5)
WBC: 10.2 10*3/uL (ref 3.8–10.6)

## 2015-10-22 LAB — BASIC METABOLIC PANEL
Anion gap: 8 (ref 5–15)
BUN: 20 mg/dL (ref 6–20)
CHLORIDE: 103 mmol/L (ref 101–111)
CO2: 28 mmol/L (ref 22–32)
CREATININE: 1.26 mg/dL — AB (ref 0.61–1.24)
Calcium: 8.9 mg/dL (ref 8.9–10.3)
GFR, EST NON AFRICAN AMERICAN: 56 mL/min — AB (ref >60)
Glucose, Bld: 150 mg/dL — ABNORMAL HIGH (ref 65–99)
POTASSIUM: 4.5 mmol/L (ref 3.5–5.1)
SODIUM: 139 mmol/L (ref 135–145)

## 2015-10-22 MED ORDER — CLOPIDOGREL BISULFATE 75 MG PO TABS: 75.0000 mg | ORAL_TABLET | Freq: Every day | ORAL | Status: DC

## 2015-10-22 NOTE — Discharge Summary (Signed)
[facility] Discharge Summary  Patient ID: Aaron Aaron [name]. MRN: [id number] DOB/AGE: 72-Jun-1947 72 y.o.  Admit date: 10/21/2015 Discharge date: 10/22/2015  Primary Discharge Diagnosis: Ischemic chest Aaron [name] I20.9 and Coronary artery disease with angina I25.119 Secondary Discharge Diagnosis high blood pressure and high cholesterol  Significant Diagnostic Studies: Cardiac cath with left ventricular angiogram and selective coronary injection as well as PCI and stent placement of right coronary artery.  Hospital Course: The patient was admitted to specials for cardiac cath with selective coronary angiogram after full consent, risk and benefits explained, and time out called with all approprate details voiced and discussed. The patient has had progressive canadian class4 angina with high probability risk stress test consistent with ischemic chest Aaron [name] and or anginal equivalent with coronary artery risk factors including high blood pressure and high cholesterol. The procedure was performed without complication and it revealed normal left ventricular function with ejection fraction of 55%.  It was found that the patient had severe 1 vessel coronary atherosclerosis with significant right coronary artery stenosis requiring further intervention. Therefore, the patient had a PCI and drug eluding stent placed without complication. The patient has been ambulating without further significant symptoms and has reached his maximal hospital benefit and will be discharged to home in good condition.  Cardiac rehabilitation has been discussed and recommended. Medication management of cardiovascular risk factors will be given post discharge and modified as an outpatient.   Discharge Exam: Blood pressure 138/60, pulse 70, temperature 98.4 F (36.9 C), temperature source Oral, resp. rate 20, height 6' (1.829 m), weight 255 lb 6.4 oz (115.849 kg), SpO2 97 %.  Constitutional: Alet oriented to person,  place, and time. No distress.  HENT: No nasal discharge.  Head: Normocephalic and atraumatic.  Eyes: Pupils are equal and round. No discharge.  Neck: Normal range of motion. Neck supple. No JVD present. No thyromegaly present.  Cardiovascular: Normal rate, regular rhythm, normal S1 S2, no gallop, no friction rub. No murmur Pulmonary/Chest: Effort normal, No stridor. No respiratory distress. no wheezes.  no rales.    Abdominal: Soft. Bowel sounds are normal.  no distension.  no tenderness. There is no rebound and no guarding.  Musculoskeletal: No edema, no cyanosis, normal pulses, no bleeding, Normal range of motion. no tenderness.  Neurological:  alert and oriented to person, place, and time. Coordination normal.  Skin: Skin is warm and dry. No rash noted. No erythema. No pallor.  Psychiatric:  normal mood and affect. behavior is normal.    Labs:   Lab Results  Component Value Date   WBC 10.2 10/22/2015   HGB 14.0 10/22/2015   HCT 40.9 10/22/2015   MCV 86.6 10/22/2015   PLT 186 10/22/2015     Recent Labs Lab 10/22/15 0441  NA 139  K 4.5  CL 103  CO2 28  BUN 20  CREATININE 1.26*  CALCIUM 8.9  GLUCOSE 150*    EKG: NSR without evidence of new changes  FOLLOW UP IN ONE TO TWO WEEKS    Medication List    STOP taking these medications        amoxicillin-clavulanate 875-125 MG tablet  Commonly known as:  AUGMENTIN     augmented betamethasone dipropionate 0.05 % cream  Commonly known as:  DIPROLENE-AF     doxycycline 100 MG EC tablet  Commonly known as:  DORYX     indomethacin 50 MG capsule  Commonly known as:  INDOCIN     ketoconazole 2 % shampoo  Commonly known as:  NIZORAL     NITROSTAT 0.4 MG SL tablet  Generic drug:  nitroGLYCERIN     omeprazole 20 MG capsule  Commonly known as:  PRILOSEC      TAKE these medications        allopurinol 100 MG tablet  Commonly known as:  ZYLOPRIM  Take 100 mg by mouth daily as needed. For gout flare up.      clopidogrel 75 MG tablet  Commonly known as:  PLAVIX  Take 1 tablet (75 mg total) by mouth daily with breakfast.     Flaxseed Misc  Take 2,400 mg by mouth every other day.     traMADol 50 MG tablet  Commonly known as:  ULTRAM  Take 50 mg by mouth 2 (two) times daily as needed. for Aaron [name]       Follow-up Information    [facility].   Specialty:  Emergency Medicine   Contact information:   [address] [id number] [place] [id number] [phone]      Follow [facility] with [facility].   Specialty:  Emergency Medicine   Contact information:   [address] [id number] [place] [id number] [phone]      Schedule an appointment as soon as possible [facility]   Why:  cath follow up      THE PATIENT  SHALL BRING ALL MEDICATIONS TO FOLLOW UP APPOINTMENT  Signed:  Lamar BlinksKOWALSKI,Haley Fuerstenberg J MD, [facility] 10/22/2015, 8:34 AM

## 2015-10-22 NOTE — Progress Notes (Signed)
Pt discharged home via wheelchair, family at bedside. IV discontinued without incident. Home medication reviewed, no question verbalized. Right groin management reviewed with patient.

## 2015-10-22 NOTE — Care Management Obs Status (Signed)
MEDICARE OBSERVATION STATUS NOTIFICATION   Patient Details  Name: Aaron PainVernon N Endres Jr. MRN: [id number] Date of Birth: 07/28/1945   Medicare Observation Status Notification Given:  Yes    Marily MemosLisa M Miesha Bachmann, RN 10/22/2015, 8:53 AM

## 2016-10-07 ENCOUNTER — Ambulatory Visit
Admission: RE | Admit: 2016-10-07 | Discharge: 2016-10-07 | Disposition: A | Discharge status: Home / Self Care | Payer: Medicare Other | Source: Ambulatory Visit | Attending: Internal Medicine | Admitting: Internal Medicine

## 2016-10-07 ENCOUNTER — Encounter
Admission: RE | Disposition: A | Discharge status: Home / Self Care | Payer: Self-pay | Source: Ambulatory Visit | Attending: Internal Medicine

## 2016-10-07 ENCOUNTER — Encounter: Payer: Self-pay | Admitting: *Deleted

## 2016-10-07 DIAGNOSIS — E7800 Pure hypercholesterolemia, unspecified: Secondary | ICD-10-CM | POA: Insufficient documentation

## 2016-10-07 DIAGNOSIS — I2510 Atherosclerotic heart disease of native coronary artery without angina pectoris: Secondary | ICD-10-CM | POA: Diagnosis not present

## 2016-10-07 DIAGNOSIS — Z955 Presence of coronary angioplasty implant and graft: Secondary | ICD-10-CM | POA: Insufficient documentation

## 2016-10-07 DIAGNOSIS — I200 Unstable angina: Secondary | ICD-10-CM | POA: Diagnosis present

## 2016-10-07 HISTORY — PX: LEFT HEART CATH AND CORONARY ANGIOGRAPHY: CATH118249

## 2016-10-07 SURGERY — LEFT HEART CATH AND CORONARY ANGIOGRAPHY
Anesthesia: Moderate Sedation | Laterality: Left

## 2016-10-07 MED ORDER — LIDOCAINE HCL (PF) 1 % IJ SOLN
INTRAMUSCULAR | Status: DC | PRN
  Administered 2016-10-07: 18 mL

## 2016-10-07 MED ORDER — FENTANYL CITRATE (PF) 100 MCG/2ML IJ SOLN
INTRAMUSCULAR | Status: DC | PRN
  Administered 2016-10-07: 25 ug via INTRAVENOUS

## 2016-10-07 MED ORDER — FENTANYL CITRATE (PF) 100 MCG/2ML IJ SOLN
INTRAMUSCULAR | Status: AC
  Filled 2016-10-07: qty 2

## 2016-10-07 MED ORDER — SODIUM CHLORIDE 0.9 % WEIGHT BASED INFUSION: 347.0000 mL/h | INTRAVENOUS | Status: DC

## 2016-10-07 MED ORDER — SODIUM CHLORIDE 0.9 % WEIGHT BASED INFUSION: 115.7000 mL/h | INTRAVENOUS | Status: DC

## 2016-10-07 MED ORDER — MIDAZOLAM HCL 2 MG/2ML IJ SOLN
INTRAMUSCULAR | Status: AC
  Filled 2016-10-07: qty 2

## 2016-10-07 MED ORDER — ASPIRIN 81 MG PO CHEW: 81.0000 mg | CHEWABLE_TABLET | ORAL | Status: DC

## 2016-10-07 MED ORDER — SODIUM CHLORIDE 0.9 % IV SOLN: 250.0000 mL | INTRAVENOUS | Status: DC | PRN

## 2016-10-07 MED ORDER — SODIUM CHLORIDE 0.9% FLUSH: 3.0000 mL | INTRAVENOUS | Status: DC | PRN

## 2016-10-07 MED ORDER — SODIUM CHLORIDE 0.9% FLUSH: 3.0000 mL | Freq: Two times a day (BID) | INTRAVENOUS | Status: DC

## 2016-10-07 MED ORDER — IOPAMIDOL (ISOVUE-300) INJECTION 61%
INTRAVENOUS | Status: DC | PRN
  Administered 2016-10-07: 130 mL via INTRA_ARTERIAL

## 2016-10-07 MED ORDER — ACETAMINOPHEN 325 MG PO TABS: 650.0000 mg | ORAL_TABLET | ORAL | Status: DC | PRN

## 2016-10-07 MED ORDER — MIDAZOLAM HCL 2 MG/2ML IJ SOLN
INTRAMUSCULAR | Status: DC | PRN
  Administered 2016-10-07: 1 mg via INTRAVENOUS

## 2016-10-07 MED ORDER — LIDOCAINE HCL (PF) 1 % IJ SOLN
INTRAMUSCULAR | Status: AC
  Filled 2016-10-07: qty 30

## 2016-10-07 MED ORDER — SODIUM CHLORIDE 0.9 % WEIGHT BASED INFUSION: 1.0000 mL/kg/h | INTRAVENOUS | Status: DC

## 2016-10-07 MED ORDER — HEPARIN (PORCINE) IN NACL 2-0.9 UNIT/ML-% IJ SOLN
INTRAMUSCULAR | Status: AC
  Filled 2016-10-07: qty 500

## 2016-10-07 MED ORDER — ONDANSETRON HCL 4 MG/2ML IJ SOLN: 4.0000 mg | Freq: Four times a day (QID) | INTRAMUSCULAR | Status: DC | PRN

## 2016-10-07 SURGICAL SUPPLY — 9 items
CATH 5FR JR4 DIAGNOSTIC (CATHETERS) ×2 IMPLANT
CATH 5FR PIGTAIL DIAGNOSTIC (CATHETERS) ×3 IMPLANT
CATH INFINITI 5FR JL4 (CATHETERS) ×3 IMPLANT
DEVICE CLOSURE MYNXGRIP 5F (Vascular Products) ×3 IMPLANT
KIT MANI 3VAL PERCEP (MISCELLANEOUS) ×3 IMPLANT
NEEDLE PERC 18GX7CM (NEEDLE) ×3 IMPLANT
PACK CARDIAC CATH (CUSTOM PROCEDURE TRAY) ×3 IMPLANT
SHEATH PINNACLE 5F 10CM (SHEATH) ×3 IMPLANT
WIRE EMERALD 3MM-J .035X150CM (WIRE) ×3 IMPLANT

## 2018-01-14 ENCOUNTER — Other Ambulatory Visit: Payer: Self-pay | Admitting: Student

## 2018-01-14 DIAGNOSIS — M7521 Bicipital tendinitis, right shoulder: Secondary | ICD-10-CM

## 2018-01-14 DIAGNOSIS — M7581 Other shoulder lesions, right shoulder: Secondary | ICD-10-CM

## 2018-01-27 ENCOUNTER — Ambulatory Visit
Admission: RE | Admit: 2018-01-27 | Discharge: 2018-01-27 | Disposition: A | Discharge status: Home / Self Care | Payer: Medicare Other | Source: Ambulatory Visit | Attending: Student | Admitting: Student

## 2018-01-27 DIAGNOSIS — M7521 Bicipital tendinitis, right shoulder: Secondary | ICD-10-CM | POA: Diagnosis not present

## 2018-01-27 DIAGNOSIS — M24811 Other specific joint derangements of right shoulder, not elsewhere classified: Secondary | ICD-10-CM | POA: Diagnosis not present

## 2018-01-27 DIAGNOSIS — M7581 Other shoulder lesions, right shoulder: Secondary | ICD-10-CM | POA: Insufficient documentation

## 2018-01-27 DIAGNOSIS — M75121 Complete rotator cuff tear or rupture of right shoulder, not specified as traumatic: Secondary | ICD-10-CM | POA: Diagnosis not present

## 2018-02-07 DIAGNOSIS — M7581 Other shoulder lesions, right shoulder: Secondary | ICD-10-CM | POA: Insufficient documentation

## 2018-02-07 NOTE — Progress Notes (Signed)
Per Leah in scheduling, [facility] changed to 8am. No answer at patient's home number. Spoke with Miguel RotaWeldon Weeks, son-in-law, who is agreeable to new arrival time and will contact patient.

## 2018-02-08 ENCOUNTER — Ambulatory Visit
Admission: RE | Admit: 2018-02-08 | Discharge: 2018-02-08 | Disposition: A | Discharge status: Home / Self Care | Payer: Medicare Other | Source: Ambulatory Visit | Attending: Surgery | Admitting: Surgery

## 2018-02-08 ENCOUNTER — Encounter
Admission: RE | Disposition: A | Discharge status: Home / Self Care | Payer: Self-pay | Source: Ambulatory Visit | Attending: Surgery

## 2018-02-08 ENCOUNTER — Encounter: Payer: Self-pay | Admitting: *Deleted

## 2018-02-08 ENCOUNTER — Encounter: Payer: Self-pay | Admitting: Anesthesiology

## 2018-02-08 ENCOUNTER — Other Ambulatory Visit: Payer: Self-pay

## 2018-02-08 DIAGNOSIS — M7521 Bicipital tendinitis, right shoulder: Secondary | ICD-10-CM | POA: Insufficient documentation

## 2018-02-08 DIAGNOSIS — Z538 Procedure and treatment not carried out for other reasons: Secondary | ICD-10-CM | POA: Diagnosis not present

## 2018-02-08 DIAGNOSIS — M7581 Other shoulder lesions, right shoulder: Secondary | ICD-10-CM | POA: Diagnosis not present

## 2018-02-08 DIAGNOSIS — M75100 Unspecified rotator cuff tear or rupture of unspecified shoulder, not specified as traumatic: Secondary | ICD-10-CM | POA: Diagnosis present

## 2018-02-08 LAB — GLUCOSE, CAPILLARY: GLUCOSE-CAPILLARY: 189 mg/dL — AB (ref 70–99)

## 2018-02-08 SURGERY — ARTHROSCOPY, SHOULDER WITH REPAIR, ROTATOR CUFF, OPEN
Anesthesia: Choice | Laterality: Right

## 2018-02-08 MED ORDER — CEFAZOLIN SODIUM-DEXTROSE 2-4 GM/100ML-% IV SOLN: 2.0000 g | Freq: Once | INTRAVENOUS | Status: DC

## 2018-02-08 MED ORDER — FAMOTIDINE 20 MG PO TABS
ORAL_TABLET | ORAL | Status: AC
  Administered 2018-02-08: 20 mg via ORAL
  Filled 2018-02-08: qty 1

## 2018-02-08 MED ORDER — FAMOTIDINE 20 MG PO TABS
20.0000 mg | ORAL_TABLET | Freq: Once | ORAL | Status: AC
  Administered 2018-02-08: 20 mg via ORAL

## 2018-02-08 MED ORDER — EPINEPHRINE PF 1 MG/ML IJ SOLN
INTRAMUSCULAR | Status: AC
  Filled 2018-02-08: qty 2

## 2018-02-08 MED ORDER — BUPIVACAINE-EPINEPHRINE (PF) 0.25% -1:200000 IJ SOLN
INTRAMUSCULAR | Status: AC
  Filled 2018-02-08: qty 30

## 2018-02-08 MED ORDER — CEFAZOLIN SODIUM-DEXTROSE 2-4 GM/100ML-% IV SOLN
INTRAVENOUS | Status: AC
  Filled 2018-02-08: qty 100

## 2018-02-08 MED ORDER — SODIUM CHLORIDE 0.9 % IV SOLN: INTRAVENOUS | Status: DC

## 2018-02-08 SURGICAL SUPPLY — 41 items
BIT DRILL JUGRKNT W/NDL BIT2.9 (DRILL) IMPLANT
BLADE FULL RADIUS 3.5 (BLADE) ×3 IMPLANT
BUR ACROMIONIZER 4.0 (BURR) ×3 IMPLANT
CANNULA SHAVER 8MMX76MM (CANNULA) ×3 IMPLANT
CHLORAPREP W/TINT 26ML (MISCELLANEOUS) ×3 IMPLANT
COVER MAYO STAND STRL (DRAPES) ×3 IMPLANT
DRAPE IMP U-DRAPE 54X76 (DRAPES) ×6 IMPLANT
DRILL JUGGERKNOT W/NDL BIT 2.9 (DRILL)
ELECT REM PT RETURN 9FT ADLT (ELECTROSURGICAL) ×3
ELECTRODE REM PT RTRN 9FT ADLT (ELECTROSURGICAL) ×1 IMPLANT
GAUZE PETRO XEROFOAM 1X8 (MISCELLANEOUS) ×3 IMPLANT
GAUZE SPONGE 4X4 12PLY STRL (GAUZE/BANDAGES/DRESSINGS) ×3 IMPLANT
GLOVE BIO SURGEON STRL SZ7.5 (GLOVE) ×6 IMPLANT
GLOVE BIO SURGEON STRL SZ8 (GLOVE) ×6 IMPLANT
GLOVE BIOGEL PI IND STRL 8 (GLOVE) ×1 IMPLANT
GLOVE BIOGEL PI INDICATOR 8 (GLOVE) ×2
GLOVE INDICATOR 8.0 STRL GRN (GLOVE) ×3 IMPLANT
GOWN STRL REUS W/ TWL LRG LVL3 (GOWN DISPOSABLE) ×1 IMPLANT
GOWN STRL REUS W/ TWL XL LVL3 (GOWN DISPOSABLE) ×1 IMPLANT
GOWN STRL REUS W/TWL LRG LVL3 (GOWN DISPOSABLE) ×2
GOWN STRL REUS W/TWL XL LVL3 (GOWN DISPOSABLE) ×2
GRASPER SUT 15 45D LOW PRO (SUTURE) IMPLANT
IV LACTATED RINGER IRRG 3000ML (IV SOLUTION) ×4
IV LR IRRIG 3000ML ARTHROMATIC (IV SOLUTION) ×2 IMPLANT
MANIFOLD NEPTUNE II (INSTRUMENTS) ×3 IMPLANT
MASK FACE SPIDER DISP (MASK) ×3 IMPLANT
MAT ABSORB  FLUID 56X50 GRAY (MISCELLANEOUS) ×2
MAT ABSORB FLUID 56X50 GRAY (MISCELLANEOUS) ×1 IMPLANT
PACK ARTHROSCOPY SHOULDER (MISCELLANEOUS) ×3 IMPLANT
SLING ARM LRG DEEP (SOFTGOODS) ×3 IMPLANT
SLING ULTRA II LG (MISCELLANEOUS) ×3 IMPLANT
STAPLER SKIN PROX 35W (STAPLE) ×3 IMPLANT
STRAP SAFETY 5IN WIDE (MISCELLANEOUS) ×3 IMPLANT
SUT ETHIBOND 0 MO6 C/R (SUTURE) ×3 IMPLANT
SUT VIC AB 2-0 CT1 27 (SUTURE) ×4
SUT VIC AB 2-0 CT1 TAPERPNT 27 (SUTURE) ×2 IMPLANT
TAPE MICROFOAM 4IN (TAPE) ×3 IMPLANT
TUBING ARTHRO INFLOW-ONLY STRL (TUBING) ×3 IMPLANT
TUBING CONNECTING 10 (TUBING) ×2 IMPLANT
TUBING CONNECTING 10' (TUBING) ×1
WAND HAND CNTRL MULTIVAC 90 (MISCELLANEOUS) ×3 IMPLANT

## 2018-02-08 NOTE — OR Nursing (Signed)
Dr. Noralyn Pickarroll concerned that the patient has not had cardiac clearance. EKG done and Dr. Philemon KingdomKowalski's office called to see if clearance could be obtained today. They report the patient would be unable to be cleared without additional testing. EKG and clearance request faxed to Dr. Philemon KingdomKowalski's office.

## 2018-02-08 NOTE — H&P (Signed)
Patient's surgery postponed pending cardiac clearance.

## 2018-02-08 NOTE — Anesthesia Preprocedure Evaluation (Deleted)
Anesthesia Evaluation  Patient identified by MRN, date of birth, ID band Patient awake    Reviewed: Allergy & Precautions, NPO status , Patient's Chart, lab work & pertinent test results  Airway        Dental   Pulmonary former smoker,           Cardiovascular + angina with exertion + CAD and + Past MI       Neuro/Psych negative neurological ROS  negative psych ROS   GI/Hepatic negative GI ROS, Neg liver ROS,   Endo/Other  diabetes  Renal/GU negative Renal ROS  negative genitourinary   Musculoskeletal   Abdominal   Peds negative pediatric ROS (+)  Hematology negative hematology ROS (+)   Anesthesia Other Findings   Reproductive/Obstetrics                             Anesthesia Physical Anesthesia Plan  ASA: III  Anesthesia Plan: General   Post-op Pain Management:    Induction: Intravenous  PONV Risk Score and Plan:   Airway Management Planned: Oral ETT  Additional Equipment:   Intra-op Plan:   Post-operative Plan: Extubation in OR  Informed Consent: I have reviewed the patients History and Physical, chart, labs and discussed the procedure including the risks, benefits and alternatives for the proposed anesthesia with the patient or authorized representative who has indicated his/her understanding and acceptance.   Dental advisory given  Plan Discussed with: CRNA and Surgeon  Anesthesia Plan Comments:         Anesthesia Quick Evaluation

## 2018-02-22 ENCOUNTER — Other Ambulatory Visit: Payer: Self-pay

## 2018-02-22 ENCOUNTER — Encounter
Admission: RE | Admit: 2018-02-22 | Discharge: 2018-02-22 | Disposition: A | Discharge status: Home / Self Care | Payer: Medicare Other | Source: Ambulatory Visit | Attending: Surgery | Admitting: Surgery

## 2018-02-22 DIAGNOSIS — Z7982 Long term (current) use of aspirin: Secondary | ICD-10-CM | POA: Diagnosis not present

## 2018-02-22 DIAGNOSIS — Z7984 Long term (current) use of oral hypoglycemic drugs: Secondary | ICD-10-CM | POA: Diagnosis not present

## 2018-02-22 DIAGNOSIS — I252 Old myocardial infarction: Secondary | ICD-10-CM | POA: Diagnosis not present

## 2018-02-22 DIAGNOSIS — Z882 Allergy status to sulfonamides status: Secondary | ICD-10-CM | POA: Diagnosis not present

## 2018-02-22 DIAGNOSIS — Z87891 Personal history of nicotine dependence: Secondary | ICD-10-CM | POA: Diagnosis not present

## 2018-02-22 DIAGNOSIS — I10 Essential (primary) hypertension: Secondary | ICD-10-CM | POA: Diagnosis not present

## 2018-02-22 DIAGNOSIS — Z955 Presence of coronary angioplasty implant and graft: Secondary | ICD-10-CM | POA: Diagnosis not present

## 2018-02-22 DIAGNOSIS — M65811 Other synovitis and tenosynovitis, right shoulder: Secondary | ICD-10-CM | POA: Diagnosis not present

## 2018-02-22 DIAGNOSIS — M7541 Impingement syndrome of right shoulder: Secondary | ICD-10-CM | POA: Diagnosis not present

## 2018-02-22 DIAGNOSIS — Z79899 Other long term (current) drug therapy: Secondary | ICD-10-CM | POA: Diagnosis not present

## 2018-02-22 DIAGNOSIS — K219 Gastro-esophageal reflux disease without esophagitis: Secondary | ICD-10-CM | POA: Diagnosis not present

## 2018-02-22 DIAGNOSIS — M75121 Complete rotator cuff tear or rupture of right shoulder, not specified as traumatic: Secondary | ICD-10-CM | POA: Diagnosis present

## 2018-02-22 DIAGNOSIS — M7581 Other shoulder lesions, right shoulder: Secondary | ICD-10-CM | POA: Diagnosis not present

## 2018-02-22 DIAGNOSIS — E785 Hyperlipidemia, unspecified: Secondary | ICD-10-CM | POA: Diagnosis not present

## 2018-02-22 DIAGNOSIS — Z7902 Long term (current) use of antithrombotics/antiplatelets: Secondary | ICD-10-CM | POA: Diagnosis not present

## 2018-02-22 DIAGNOSIS — I25119 Atherosclerotic heart disease of native coronary artery with unspecified angina pectoris: Secondary | ICD-10-CM | POA: Diagnosis not present

## 2018-02-22 DIAGNOSIS — Z888 Allergy status to other drugs, medicaments and biological substances status: Secondary | ICD-10-CM | POA: Diagnosis not present

## 2018-02-22 DIAGNOSIS — E119 Type 2 diabetes mellitus without complications: Secondary | ICD-10-CM | POA: Diagnosis not present

## 2018-02-22 LAB — COMPREHENSIVE METABOLIC PANEL
ALBUMIN: 4.1 g/dL (ref 3.5–5.0)
ALT: 23 U/L (ref 0–44)
ANION GAP: 8 (ref 5–15)
AST: 22 U/L (ref 15–41)
Alkaline Phosphatase: 75 U/L (ref 38–126)
BILIRUBIN TOTAL: 0.9 mg/dL (ref 0.3–1.2)
BUN: 21 mg/dL (ref 8–23)
CO2: 26 mmol/L (ref 22–32)
Calcium: 8.9 mg/dL (ref 8.9–10.3)
Chloride: 104 mmol/L (ref 98–111)
Creatinine, Ser: 1.32 mg/dL — ABNORMAL HIGH (ref 0.61–1.24)
GFR calc Af Amer: >60 mL/min (ref >60)
GFR, EST NON AFRICAN AMERICAN: 52 mL/min — AB (ref >60)
Glucose, Bld: 341 mg/dL — ABNORMAL HIGH (ref 70–99)
POTASSIUM: 4 mmol/L (ref 3.5–5.1)
Sodium: 138 mmol/L (ref 135–145)
TOTAL PROTEIN: 7.1 g/dL (ref 6.5–8.1)

## 2018-02-22 LAB — CBC
HCT: 39.2 % — ABNORMAL LOW (ref 40.0–52.0)
Hemoglobin: 13.7 g/dL (ref 13.0–18.0)
MCH: 31.1 pg (ref 26.0–34.0)
MCHC: 35.1 g/dL (ref 32.0–36.0)
MCV: 88.6 fL (ref 80.0–100.0)
PLATELETS: 183 10*3/uL (ref 150–440)
RBC: 4.42 MIL/uL (ref 4.40–5.90)
RDW: 15.3 % — ABNORMAL HIGH (ref 11.5–14.5)
WBC: 5.8 10*3/uL (ref 3.8–10.6)

## 2018-02-22 NOTE — Patient Instructions (Signed)
Your procedure is scheduled on: Thursday 02/24/18.  Report to DAY SURGERY DEPARTMENT LOCATED ON 2ND FLOOR MEDICAL MALL ENTRANCE. To find out your arrival time please call [phone] between 1PM - 3PM on Wednesday 02/23/18.  Remember: Instructions that are not followed completely may result in serious medical risk, up to and including death, or upon the discretion of your surgeon and anesthesiologist your surgery may need to be rescheduled.     _X__ 1. Do not eat food after midnight the night before your procedure.                 No gum chewing or hard candies. You may drink clear liquids up to 2 hours                 before you are scheduled to arrive for your surgery- DO not drink clear                 liquids within 2 hours of the start of your surgery.                 Clear Liquids include:  water, apple juice without pulp, clear carbohydrate                 drink such as Clearfast or Gatorade, Black Coffee or Tea (Do not add                 anything to coffee or tea).  __X__2.  On the morning of surgery brush your teeth with toothpaste and water, you may rinse your mouth with mouthwash if you wish.  Do not swallow any toothpaste of mouthwash.     _X__ 3.  No Alcohol for 24 hours before or after surgery.   _X__ 4.  Do Not Smoke or use e-cigarettes For 24 Hours Prior to Your Surgery.                 Do not use any chewable tobacco products for at least 6 hours prior to                 surgery.  ____  5.  Bring all medications with you on the day of surgery if instructed.   __X__  6.  Notify your doctor if there is any change in your medical condition      (cold, fever, infections).     Do not wear jewelry, make-up, hairpins, clips or nail polish. Do not wear lotions, powders, or perfumes.  Do not shave 48 hours prior to surgery. Men may shave face and neck. Do not bring valuables to the hospital.    [facility] is not responsible for any belongings or valuables.  Contacts,  dentures/partials or body piercings may not be worn into surgery. Bring a case for your contacts, glasses or hearing aids, a denture cup will be supplied. Leave your suitcase in the car. After surgery it may be brought to your room. For patients admitted to the hospital, discharge time is determined by your treatment team.   Patients discharged the day of surgery will not be allowed to drive home.   Please read over the following fact sheets that you were given:   MRSA Information  __X__ Take these medicines the morning of surgery with A SIP OF WATER:     1. traMADol (ULTRAM) 50 MG tablet IF NEEDED  2. nitroGLYCERIN (NITROSTAT) 0.4 MG SL tablet IF NEEDED  3.   4.  5.  6.  ____ Fleet Enema (as directed)   __X__ Use CHG Soap/SAGE wipes as directed  __X__ Stop Anti-inflammatories 7 days before surgery such as Advil, Ibuprofen, Motrin, BC or Goodies Powder, Naprosyn, Naproxen, Aleve, Aspirin, Meloxicam. May take Tylenol if needed for pain or discomfort.    __X__ Stop all herbal supplements, fish oil or vitamin E until after surgery.

## 2018-02-22 NOTE — Pre-Procedure Instructions (Signed)
REQUEST FOR DR CONROY TO ADDRESS GLU 341 PREOP FAXED AND SPOKE WITH JENNIFER. ALSO FYI FAXED TO DR Joice Lofts

## 2018-02-22 NOTE — Pre-Procedure Instructions (Signed)
ECG 12-lead8/26/2019 [facility] System Component Name Value Ref Range  Vent Rate (bpm) 94   PR Interval (msec) 184   QRS Interval (msec) 102   QT Interval (msec) 360   QTc (msec) 450   Other Result Information  This result has an attachment that is not available.  Result Narrative  Normal sinus rhythm Left axis deviation Abnormal ECG No previous ECGs available I reviewed and concur with this report. Electronically signed [id number] MD, Smitty Cords [phone]) on 02/15/2018 1:55:00 PM  Status Results Details   Encounter Summary

## 2018-02-23 MED ORDER — CEFAZOLIN SODIUM-DEXTROSE 2-4 GM/100ML-% IV SOLN
2.0000 g | Freq: Once | INTRAVENOUS | Status: AC
  Administered 2018-02-24: 2 g via INTRAVENOUS

## 2018-02-24 ENCOUNTER — Ambulatory Visit: Payer: Medicare Other

## 2018-02-24 ENCOUNTER — Encounter
Admission: RE | Disposition: A | Discharge status: Home / Self Care | Payer: Self-pay | Source: Ambulatory Visit | Attending: Surgery

## 2018-02-24 ENCOUNTER — Ambulatory Visit
Admission: RE | Admit: 2018-02-24 | Discharge: 2018-02-24 | Disposition: A | Discharge status: Home / Self Care | Payer: Medicare Other | Source: Ambulatory Visit | Attending: Surgery | Admitting: Surgery

## 2018-02-24 DIAGNOSIS — I25119 Atherosclerotic heart disease of native coronary artery with unspecified angina pectoris: Secondary | ICD-10-CM | POA: Insufficient documentation

## 2018-02-24 DIAGNOSIS — Z87891 Personal history of nicotine dependence: Secondary | ICD-10-CM | POA: Insufficient documentation

## 2018-02-24 DIAGNOSIS — Z888 Allergy status to other drugs, medicaments and biological substances status: Secondary | ICD-10-CM | POA: Insufficient documentation

## 2018-02-24 DIAGNOSIS — M65811 Other synovitis and tenosynovitis, right shoulder: Secondary | ICD-10-CM | POA: Insufficient documentation

## 2018-02-24 DIAGNOSIS — Z882 Allergy status to sulfonamides status: Secondary | ICD-10-CM | POA: Insufficient documentation

## 2018-02-24 DIAGNOSIS — E119 Type 2 diabetes mellitus without complications: Secondary | ICD-10-CM | POA: Insufficient documentation

## 2018-02-24 DIAGNOSIS — Z955 Presence of coronary angioplasty implant and graft: Secondary | ICD-10-CM | POA: Insufficient documentation

## 2018-02-24 DIAGNOSIS — Z7982 Long term (current) use of aspirin: Secondary | ICD-10-CM | POA: Insufficient documentation

## 2018-02-24 DIAGNOSIS — I10 Essential (primary) hypertension: Secondary | ICD-10-CM | POA: Insufficient documentation

## 2018-02-24 DIAGNOSIS — K219 Gastro-esophageal reflux disease without esophagitis: Secondary | ICD-10-CM | POA: Insufficient documentation

## 2018-02-24 DIAGNOSIS — M75121 Complete rotator cuff tear or rupture of right shoulder, not specified as traumatic: Secondary | ICD-10-CM | POA: Diagnosis not present

## 2018-02-24 DIAGNOSIS — E785 Hyperlipidemia, unspecified: Secondary | ICD-10-CM | POA: Insufficient documentation

## 2018-02-24 DIAGNOSIS — M7581 Other shoulder lesions, right shoulder: Secondary | ICD-10-CM | POA: Insufficient documentation

## 2018-02-24 DIAGNOSIS — Z7902 Long term (current) use of antithrombotics/antiplatelets: Secondary | ICD-10-CM | POA: Insufficient documentation

## 2018-02-24 DIAGNOSIS — M7541 Impingement syndrome of right shoulder: Secondary | ICD-10-CM | POA: Insufficient documentation

## 2018-02-24 DIAGNOSIS — I252 Old myocardial infarction: Secondary | ICD-10-CM | POA: Insufficient documentation

## 2018-02-24 DIAGNOSIS — Z7984 Long term (current) use of oral hypoglycemic drugs: Secondary | ICD-10-CM | POA: Insufficient documentation

## 2018-02-24 DIAGNOSIS — Z79899 Other long term (current) drug therapy: Secondary | ICD-10-CM | POA: Insufficient documentation

## 2018-02-24 HISTORY — PX: SHOULDER ARTHROSCOPY WITH SUBACROMIAL DECOMPRESSION AND BICEP TENDON REPAIR: SHX5689

## 2018-02-24 LAB — GLUCOSE, CAPILLARY
GLUCOSE-CAPILLARY: 288 mg/dL — AB (ref 70–99)
GLUCOSE-CAPILLARY: 254 mg/dL — AB (ref 70–99)
Glucose-Capillary: 268 mg/dL — ABNORMAL HIGH (ref 70–99)

## 2018-02-24 SURGERY — SHOULDER ARTHROSCOPY WITH SUBACROMIAL DECOMPRESSION AND BICEP TENDON REPAIR
Anesthesia: Regional | Site: Shoulder | Laterality: Right | Wound class: "Clean "

## 2018-02-24 MED ORDER — OXYCODONE HCL 5 MG PO TABS: 5.0000 mg | ORAL_TABLET | ORAL | 0 refills | Status: DC | PRN

## 2018-02-24 MED ORDER — POTASSIUM CHLORIDE IN NACL 20-0.9 MEQ/L-% IV SOLN: INTRAVENOUS | Status: DC

## 2018-02-24 MED ORDER — ONDANSETRON HCL 4 MG/2ML IJ SOLN
INTRAMUSCULAR | Status: AC
  Filled 2018-02-24: qty 2

## 2018-02-24 MED ORDER — FAMOTIDINE 20 MG PO TABS: 20.0000 mg | ORAL_TABLET | Freq: Once | ORAL | Status: DC

## 2018-02-24 MED ORDER — METOCLOPRAMIDE HCL 5 MG/ML IJ SOLN: 5.0000 mg | Freq: Three times a day (TID) | INTRAMUSCULAR | Status: DC | PRN

## 2018-02-24 MED ORDER — CEFAZOLIN SODIUM-DEXTROSE 2-4 GM/100ML-% IV SOLN
INTRAVENOUS | Status: AC
  Filled 2018-02-24: qty 100

## 2018-02-24 MED ORDER — LIDOCAINE HCL (PF) 2 % IJ SOLN
INTRAMUSCULAR | Status: AC
  Filled 2018-02-24: qty 10

## 2018-02-24 MED ORDER — METOCLOPRAMIDE HCL 10 MG PO TABS: 5.0000 mg | ORAL_TABLET | Freq: Three times a day (TID) | ORAL | Status: DC | PRN

## 2018-02-24 MED ORDER — FENTANYL CITRATE (PF) 100 MCG/2ML IJ SOLN
INTRAMUSCULAR | Status: AC
  Administered 2018-02-24: 25 ug via INTRAVENOUS
  Filled 2018-02-24: qty 2

## 2018-02-24 MED ORDER — LIDOCAINE HCL (CARDIAC) PF 100 MG/5ML IV SOSY
PREFILLED_SYRINGE | INTRAVENOUS | Status: DC | PRN
  Administered 2018-02-24: 60 mg via INTRAVENOUS

## 2018-02-24 MED ORDER — FENTANYL CITRATE (PF) 100 MCG/2ML IJ SOLN
50.0000 ug | Freq: Once | INTRAMUSCULAR | Status: AC
  Administered 2018-02-24: 25 ug via INTRAVENOUS

## 2018-02-24 MED ORDER — SEVOFLURANE IN SOLN
RESPIRATORY_TRACT | Status: AC
  Filled 2018-02-24: qty 250

## 2018-02-24 MED ORDER — PROPOFOL 10 MG/ML IV BOLUS
INTRAVENOUS | Status: DC | PRN
  Administered 2018-02-24: 150 mg via INTRAVENOUS

## 2018-02-24 MED ORDER — DEXAMETHASONE SODIUM PHOSPHATE 10 MG/ML IJ SOLN
INTRAMUSCULAR | Status: DC | PRN
  Administered 2018-02-24: 5 mg via INTRAVENOUS

## 2018-02-24 MED ORDER — EPINEPHRINE PF 1 MG/ML IJ SOLN
INTRAMUSCULAR | Status: DC | PRN
  Administered 2018-02-24: 2 mL

## 2018-02-24 MED ORDER — OXYCODONE HCL 5 MG PO TABS: 5.0000 mg | ORAL_TABLET | ORAL | Status: DC | PRN

## 2018-02-24 MED ORDER — ONDANSETRON HCL 4 MG/2ML IJ SOLN
4.0000 mg | Freq: Once | INTRAMUSCULAR | Status: AC
  Administered 2018-02-24: 4 mg via INTRAVENOUS

## 2018-02-24 MED ORDER — INSULIN ASPART 100 UNIT/ML ~~LOC~~ SOLN
5.0000 [IU] | Freq: Once | SUBCUTANEOUS | Status: AC
  Administered 2018-02-24: 5 [IU] via SUBCUTANEOUS

## 2018-02-24 MED ORDER — BUPIVACAINE LIPOSOME 1.3 % IJ SUSP
INTRAMUSCULAR | Status: AC
  Filled 2018-02-24: qty 20

## 2018-02-24 MED ORDER — FENTANYL CITRATE (PF) 100 MCG/2ML IJ SOLN
INTRAMUSCULAR | Status: AC
  Filled 2018-02-24: qty 2

## 2018-02-24 MED ORDER — INSULIN ASPART 100 UNIT/ML ~~LOC~~ SOLN
SUBCUTANEOUS | Status: AC
  Filled 2018-02-24: qty 1

## 2018-02-24 MED ORDER — ONDANSETRON HCL 4 MG/2ML IJ SOLN: 4.0000 mg | Freq: Four times a day (QID) | INTRAMUSCULAR | Status: DC | PRN

## 2018-02-24 MED ORDER — BUPIVACAINE-EPINEPHRINE (PF) 0.5% -1:200000 IJ SOLN
INTRAMUSCULAR | Status: DC | PRN
  Administered 2018-02-24: 30 mL via PERINEURAL

## 2018-02-24 MED ORDER — LIDOCAINE HCL (PF) 1 % IJ SOLN
INTRAMUSCULAR | Status: AC
  Filled 2018-02-24: qty 5

## 2018-02-24 MED ORDER — MIDAZOLAM HCL 2 MG/2ML IJ SOLN
INTRAMUSCULAR | Status: AC
  Filled 2018-02-24: qty 2

## 2018-02-24 MED ORDER — ACETAMINOPHEN 10 MG/ML IV SOLN
INTRAVENOUS | Status: AC
  Filled 2018-02-24: qty 100

## 2018-02-24 MED ORDER — INSULIN ASPART 100 UNIT/ML ~~LOC~~ SOLN
SUBCUTANEOUS | Status: AC
  Administered 2018-02-24: 5 [IU] via SUBCUTANEOUS
  Filled 2018-02-24: qty 1

## 2018-02-24 MED ORDER — ROCURONIUM BROMIDE 100 MG/10ML IV SOLN
INTRAVENOUS | Status: DC | PRN
  Administered 2018-02-24: 50 mg via INTRAVENOUS

## 2018-02-24 MED ORDER — SODIUM CHLORIDE 0.9 % IV SOLN: INTRAVENOUS | Status: DC

## 2018-02-24 MED ORDER — ONDANSETRON HCL 4 MG/2ML IJ SOLN
INTRAMUSCULAR | Status: DC | PRN
  Administered 2018-02-24: 4 mg via INTRAVENOUS

## 2018-02-24 MED ORDER — SUGAMMADEX SODIUM 200 MG/2ML IV SOLN
INTRAVENOUS | Status: DC | PRN
  Administered 2018-02-24: 200 mg via INTRAVENOUS

## 2018-02-24 MED ORDER — DEXAMETHASONE SODIUM PHOSPHATE 10 MG/ML IJ SOLN
INTRAMUSCULAR | Status: AC
  Filled 2018-02-24: qty 1

## 2018-02-24 MED ORDER — ACETAMINOPHEN 10 MG/ML IV SOLN
INTRAVENOUS | Status: DC | PRN
  Administered 2018-02-24: 1000 mg via INTRAVENOUS

## 2018-02-24 MED ORDER — MIDAZOLAM HCL 2 MG/2ML IJ SOLN
INTRAMUSCULAR | Status: AC
  Administered 2018-02-24: 1 mg via INTRAVENOUS
  Filled 2018-02-24: qty 2

## 2018-02-24 MED ORDER — MIDAZOLAM HCL 2 MG/2ML IJ SOLN
1.0000 mg | Freq: Once | INTRAMUSCULAR | Status: AC
  Administered 2018-02-24: 1 mg via INTRAVENOUS

## 2018-02-24 MED ORDER — LACTATED RINGERS IV SOLN
INTRAVENOUS | Status: DC | PRN
  Administered 2018-02-24: 08:00:00 via INTRAVENOUS

## 2018-02-24 MED ORDER — SUGAMMADEX SODIUM 200 MG/2ML IV SOLN
INTRAVENOUS | Status: AC
  Filled 2018-02-24: qty 2

## 2018-02-24 MED ORDER — FENTANYL CITRATE (PF) 100 MCG/2ML IJ SOLN
INTRAMUSCULAR | Status: DC | PRN
  Administered 2018-02-24: 50 ug via INTRAVENOUS

## 2018-02-24 MED ORDER — PROPOFOL 500 MG/50ML IV EMUL
INTRAVENOUS | Status: AC
  Filled 2018-02-24: qty 50

## 2018-02-24 MED ORDER — ONDANSETRON HCL 4 MG PO TABS: 4.0000 mg | ORAL_TABLET | Freq: Four times a day (QID) | ORAL | Status: DC | PRN

## 2018-02-24 MED ORDER — BUPIVACAINE HCL (PF) 0.5 % IJ SOLN
INTRAMUSCULAR | Status: AC
  Filled 2018-02-24: qty 10

## 2018-02-24 SURGICAL SUPPLY — 44 items
ANCHOR JUGGERKNOT WTAP NDL 2.9 (Anchor) ×4 IMPLANT
ANCHOR SUT QUATTRO KNTLS 4.5 (Anchor) ×2 IMPLANT
BIT DRILL JUGRKNT W/NDL BIT2.9 (DRILL) IMPLANT
BLADE FULL RADIUS 3.5 (BLADE) ×3 IMPLANT
BUR ACROMIONIZER 4.0 (BURR) ×3 IMPLANT
CANNULA SHAVER 8MMX76MM (CANNULA) ×3 IMPLANT
CHLORAPREP W/TINT 26ML (MISCELLANEOUS) ×3 IMPLANT
COVER MAYO STAND STRL (DRAPES) IMPLANT
DRAPE IMP U-DRAPE 54X76 (DRAPES) ×6 IMPLANT
DRILL JUGGERKNOT W/NDL BIT 2.9 (DRILL) ×3
ELECT REM PT RETURN 9FT ADLT (ELECTROSURGICAL) ×3
ELECTRODE REM PT RTRN 9FT ADLT (ELECTROSURGICAL) ×1 IMPLANT
GAUZE PETRO XEROFOAM 1X8 (MISCELLANEOUS) ×3 IMPLANT
GAUZE SPONGE 4X4 12PLY STRL (GAUZE/BANDAGES/DRESSINGS) ×3 IMPLANT
GLOVE BIO SURGEON STRL SZ7.5 (GLOVE) ×6 IMPLANT
GLOVE BIO SURGEON STRL SZ8 (GLOVE) ×6 IMPLANT
GLOVE BIOGEL PI IND STRL 8 (GLOVE) ×1 IMPLANT
GLOVE BIOGEL PI INDICATOR 8 (GLOVE) ×2
GLOVE INDICATOR 8.0 STRL GRN (GLOVE) ×3 IMPLANT
GOWN STRL REUS W/ TWL LRG LVL3 (GOWN DISPOSABLE) ×1 IMPLANT
GOWN STRL REUS W/ TWL XL LVL3 (GOWN DISPOSABLE) ×1 IMPLANT
GOWN STRL REUS W/TWL LRG LVL3 (GOWN DISPOSABLE) ×2
GOWN STRL REUS W/TWL XL LVL3 (GOWN DISPOSABLE) ×2
GRASPER SUT 15 45D LOW PRO (SUTURE) IMPLANT
IV LACTATED RINGER IRRG 3000ML (IV SOLUTION) ×4
IV LR IRRIG 3000ML ARTHROMATIC (IV SOLUTION) ×2 IMPLANT
MANIFOLD NEPTUNE II (INSTRUMENTS) ×3 IMPLANT
MASK FACE SPIDER DISP (MASK) ×3 IMPLANT
MAT ABSORB  FLUID 56X50 GRAY (MISCELLANEOUS) ×2
MAT ABSORB FLUID 56X50 GRAY (MISCELLANEOUS) ×1 IMPLANT
NEEDLE HYPO 22GX1.5 SAFETY (NEEDLE) ×3 IMPLANT
PACK ARTHROSCOPY SHOULDER (MISCELLANEOUS) ×3 IMPLANT
SLING ARM LRG DEEP (SOFTGOODS) ×1 IMPLANT
SLING ULTRA II LG (MISCELLANEOUS) ×3 IMPLANT
STAPLER SKIN PROX 35W (STAPLE) ×3 IMPLANT
STRAP SAFETY 5IN WIDE (MISCELLANEOUS) ×3 IMPLANT
SUT ETHIBOND 0 MO6 C/R (SUTURE) ×3 IMPLANT
SUT VIC AB 2-0 CT1 27 (SUTURE) ×4
SUT VIC AB 2-0 CT1 TAPERPNT 27 (SUTURE) ×2 IMPLANT
TAPE MICROFOAM 4IN (TAPE) ×3 IMPLANT
TUBING ARTHRO INFLOW-ONLY STRL (TUBING) ×3 IMPLANT
TUBING CONNECTING 10 (TUBING) ×2 IMPLANT
TUBING CONNECTING 10' (TUBING) ×1
WAND HAND CNTRL MULTIVAC 90 (MISCELLANEOUS) ×3 IMPLANT

## 2018-02-24 NOTE — Progress Notes (Addendum)
Dr. Pernell Dupre notified that pt CBG was now 268. Acknowledged. No new orders. Tien Aispuro E 11:13 AM 02/24/2018

## 2018-02-24 NOTE — Anesthesia Procedure Notes (Addendum)
Procedure Name: Intubation Date/Time: 02/24/2018 8:57 AM Performed by: Phillip Heal, RN Pre-anesthesia Checklist: Patient identified, Emergency Drugs available, Suction available, Patient being monitored and Timeout performed Patient Re-evaluated:Patient Re-evaluated prior to induction Oxygen Delivery Method: Circle system utilized Preoxygenation: Pre-oxygenation with 100% oxygen Induction Type: IV induction Ventilation: Mask ventilation without difficulty Laryngoscope Size: McGraph and 4 Grade View: Grade II Tube type: Oral Tube size: 7.5 mm Number of attempts: 2 Airway Equipment and Method: Bougie stylet and Video-laryngoscopy Placement Confirmation: ETT inserted through vocal cords under direct vision,  positive ETCO2 and breath sounds checked- equal and bilateral Secured at: 22 cm Tube secured with: Tape Dental Injury: Teeth and Oropharynx as per pre-operative assessment  Difficulty Due To: Difficulty was unanticipated, Difficult Airway- due to anterior larynx and Difficult Airway- due to reduced neck mobility Future Recommendations: Recommend- induction with short-acting agent, and alternative techniques readily available

## 2018-02-24 NOTE — Op Note (Signed)
02/24/2018  10:28 AM  Patient:   Aaron [name].  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  Post-Op Diagnosis:   Impingement/tendinopathy with nontraumatic full-thickness rotator cuff tear, degenerative labral fraying, and biceps tendinopathy, right shoulder.  Procedure:   Limited arthroscopic debridement, arthroscopic subacromial decompression, mini-open rotator cuff repair, and mini-open biceps tenodesis, right shoulder.  Anesthesia:   General endotracheal with interscalene block placed preoperatively by the anesthesiologist.  Surgeon:   Maryagnes Amos, MD  Assistant:   Horris Latino, [name]  Findings:   As above. There was moderate labral fraying superiorly and posterior superiorly without frank detachment from the glenoid. There was a full-thickness tear involving the mid-insertional fibers of the supraspinatus tendon measuring approximately 1.5 x 0.5 cm. The remaining portions of the rotator cuff are in satisfactory condition. There was moderate tendinopathy of the long head of the biceps tendon with partial tearing. The articular surfaces of the humerus and glenoid both were in satisfactory condition.  Complications:   None  Fluids:   800 cc  Estimated blood loss:   5 cc  Tourniquet time:   None  Drains:   None  Closure:   Staples      Brief clinical note:   The patient is a 72 year old male with a history of right shoulder [name]. The patient's symptoms have progressed despite medications, activity modification, etc. The patient's history and examination are consistent with impingement/tendinopathy with a rotator cuff tear. These findings were confirmed by MRI scan. The patient presents at this time for definitive management of these shoulder symptoms.  Procedure:   The patient underwent placement of an interscalene block by the anesthesiologist in the preoperative holding area before being brought into  the operating room and lain in the supine position. The patient then underwent general endotracheal intubation and anesthesia before being repositioned in the beach chair position using the beach chair positioner. The right shoulder and upper extremity were prepped with ChloraPrep solution before being draped sterilely. Preoperative antibiotics were administered. A timeout was performed to confirm the proper surgical site before the expected portal sites and incision site were injected with 0.5% Sensorcaine with epinephrine. A posterior portal was created and the glenohumeral joint thoroughly inspected with the findings as described above. An anterior portal was created using an outside-in technique. The labrum and rotator cuff were further probed, again confirming the above-noted findings. The areas of labral fraying were debrided back to stable margins using the full-radius resector as were areas of synovitis. The ArthroCare wand was inserted and used to release the biceps tendon from its labral anchor. It also was used to obtain hemostasis as well as to "anneal" the labrum superiorly and anteriorly. The instruments were removed from the joint after suctioning the excess fluid.  The camera was repositioned through the posterior portal into the subacromial space. A separate lateral portal was created using an outside-in technique. The 3.5 mm full-radius resector was introduced and used to perform a subtotal bursectomy. The ArthroCare wand was then inserted and used to remove the periosteal tissue off the undersurface of the anterior third of the acromion as well as to recess the coracoacromial ligament from its attachment along the anterior and lateral margins of the acromion. The 4.0 mm acromionizing bur was introduced and used to complete the decompression by removing the undersurface of the anterior third of the acromion. The full radius resector was reintroduced to remove any residual bony debris before the  ArthroCare  wand was reintroduced to obtain hemostasis. The instruments were then removed from the subacromial space after suctioning the excess fluid.  An approximately 4-5 cm incision was made over the anterolateral aspect of the shoulder beginning at the anterolateral corner of the acromion and extending distally in line with the bicipital groove. This incision was carried down through the subcutaneous tissues to expose the deltoid fascia. The raphae between the anterior and middle thirds was identified and this plane developed to provide access into the subacromial space. Additional bursal tissues were debrided sharply using Metzenbaum scissors. The rotator cuff tear was readily identified. The margins were debrided sharply with a #15 blade and the exposed greater tuberosity roughened with a rongeur. The tear was repaired using a single Biomet 2.9 mm JuggerKnot anchors. These sutures were then brought back laterally and secured using a single [employer] anchor to create a two-layer closure. An apparent watertight closure was obtained.  The bicipital groove was identified by palpation and opened for 1-1.5 cm. The biceps tendon stump was retrieved through this defect. The floor of the bicipital groove was roughened with a curet before another Biomet 2.9 mm JuggerKnot anchor was inserted. Both sets of sutures were passed through the biceps tendon and tied securely to effect the tenodesis. The bicipital sheath was reapproximated using two #0 Ethibond interrupted sutures, incorporating the biceps tendon to further reinforce the tenodesis.  The wound was copiously irrigated with sterile saline solution before the deltoid raphae was reapproximated using 2-0 Vicryl interrupted sutures. The subcutaneous tissues were closed in two layers using 2-0 Vicryl interrupted sutures before the skin was closed using staples. The portal sites also were closed using staples. A sterile bulky dressing was applied to the  shoulder before the arm was placed into a shoulder immobilizer. The patient was then awakened, extubated, and returned to the recovery room in satisfactory condition after tolerating the procedure well.

## 2018-02-24 NOTE — Anesthesia Post-op Follow-up Note (Signed)
Anesthesia QCDR form completed.        

## 2018-02-24 NOTE — Discharge Instructions (Addendum)
Orthopedic discharge instructions: Keep dressing dry and intact.  May shower after dressing changed on post-op day #4 (Monday).  Cover staples/sutures with Band-Aids after drying off. Apply ice frequently to shoulder. Take ibuprofen 600-800 mg TID with meals for 7-10 days, then as necessary. Take oxycodone as prescribed when needed.  May supplement with ES Tylenol if necessary. Keep shoulder immobilizer on at all times except may remove for bathing purposes. Follow-up in 10-14 days or as scheduled.  AMBULATORY SURGERY  DISCHARGE INSTRUCTIONS   1) The drugs that you were given will stay in your system until tomorrow so for the next 24 hours you should not:  A) Drive an automobile B) Make any legal decisions C) Drink any alcoholic beverage   2) You may resume regular meals tomorrow.  Today it is better to start with liquids and gradually work up to solid foods.  You may eat anything you prefer, but it is better to start with liquids, then soup and crackers, and gradually work up to solid foods.   3) Please notify your doctor immediately if you have any unusual bleeding, trouble breathing, redness and pain at the surgery site, drainage, fever, or pain not relieved by medication.    4) Additional Instructions:        Please contact your physician with any problems or Same Day Surgery at [phone], Monday through Friday 6 am to 4 pm, or [place] [facility] number at [phone].      Interscalene Nerve Block with Exparel  1.  For your surgery you have received an Interscalene Nerve Block with Exparel. 2. Nerve Blocks affect many types of nerves, including nerves that control movement, pain and normal sensation.  You may experience feelings such as numbness, tingling, heaviness, weakness or the inability to move your arm or the feeling or sensation that your arm has "fallen asleep". 3. A nerve block with Exparel can last up to 5 days.  Usually the weakness wears  off first.  The tingling and heaviness usually wear off next.  Finally you may start to notice pain.  Keep in mind that this may occur in any order.  Once a nerve block starts to wear off it is usually completely gone within 60 minutes. 4. ISNB may cause mild shortness of breath, a hoarse voice, blurry vision, unequal pupils, or drooping of the face on the same side as the nerve block.  These symptoms will usually resolve with the numbness.  Very rarely the procedure itself can cause mild seizures. 5. If needed, your surgeon will give you a prescription for pain medication.  It will take about 60 minutes for the oral pain medication to become fully effective.  So, it is recommended that you start taking this medication before the nerve block first begins to wear off, or when you first begin to feel discomfort. 6. Take your pain medication only as prescribed.  Pain medication can cause sedation and decrease your breathing if you take more than you need for the level of pain that you have. 7. Nausea is a common side effect of many pain medications.  You may want to eat something before taking your pain medicine to prevent nausea. 8. After an Interscalene nerve block, you cannot feel pain, pressure or extremes in temperature in the effected arm.  Because your arm is numb it is at an increased risk for injury.  To decrease the possibility of injury, please practice the following:  a. While you are awake change  the position of your arm frequently to prevent too much pressure on any one area for prolonged periods of time. b.  If you have a cast or tight dressing, check the color or your fingers every couple of hours.  Call your surgeon with the appearance of any discoloration (white or blue). c. If you are given a sling to wear before you go home, please wear it  at all times until the block has completely worn off.  Do not get up at night without your sling. d. Please contact [facility] or your surgeon if  you do not begin to regain sensation after 7 days from the surgery.  Anesthesia may be contacted by calling the Same Day Surgery Department, Mon. through Fri., 6 am to 4 pm at [phone].   e. If you experience any other problems or concerns, please contact your surgeon's office. If you experience severe or prolonged shortness of breath go to the nearest emergency department.

## 2018-02-24 NOTE — Anesthesia Preprocedure Evaluation (Signed)
Anesthesia Evaluation  Patient identified by MRN, date of birth, ID band Patient awake    Reviewed: Allergy & Precautions, H&P , NPO status , Patient's Chart, lab work & pertinent test results, reviewed documented beta blocker date and time   Airway Mallampati: III  TM Distance: >3 FB Neck ROM: full    Dental  (+) Upper Dentures   Pulmonary neg pulmonary ROS, former smoker,    Pulmonary exam normal        Cardiovascular Exercise Tolerance: Good + angina with exertion + CAD and + Past MI  negative cardio ROS Normal cardiovascular exam Rhythm:regular Rate:Normal  Kowalski clearance. JA   Neuro/Psych negative neurological ROS  negative psych ROS   GI/Hepatic negative GI ROS, Neg liver ROS,   Endo/Other  negative endocrine ROSdiabetes, Poorly Controlled  Renal/GU negative Renal ROS  negative genitourinary   Musculoskeletal   Abdominal   Peds  Hematology negative hematology ROS (+)   Anesthesia Other Findings Past Medical History: No date: Cellulitis No date: Diabetes mellitus without complication (HCC) No date: MI, old Past Surgical History: No date: ABDOMINAL SURGERY 10/21/2015: CARDIAC CATHETERIZATION; Left     Comment:  Procedure: Left Heart Cath and Coronary Angiography;                Surgeon: Lamar Blinks, MD;  Location: [facility] INVASIVE               CV LAB;  Service: Cardiovascular;  Laterality: Left; 10/21/2015: CARDIAC CATHETERIZATION; N/A     Comment:  Procedure: Coronary Stent Intervention;  Surgeon:               Marcina Millard, MD;  Location: [facility] INVASIVE CV LAB;              Service: Cardiovascular;  Laterality: N/A; No date: CORONARY ANGIOPLASTY WITH STENT PLACEMENT 10/07/2016: LEFT HEART CATH AND CORONARY ANGIOGRAPHY; Left     Comment:  Procedure: Left Heart Cath and Coronary Angiography;                Surgeon: Lamar Blinks, MD;  Location: [facility] INVASIVE               CV LAB;  Service:  Cardiovascular;  Laterality: Left;   Reproductive/Obstetrics negative OB ROS                             Anesthesia Physical Anesthesia Plan  ASA: III  Anesthesia Plan: General ETT   Post-op Pain Management:  Regional for Post-op pain   Induction:   PONV Risk Score and Plan:   Airway Management Planned:   Additional Equipment:   Intra-op Plan:   Post-operative Plan:   Informed Consent: I have reviewed the patients History and Physical, chart, labs and discussed the procedure including the risks, benefits and alternatives for the proposed anesthesia with the patient or authorized representative who has indicated his/her understanding and acceptance.   Dental Advisory Given  Plan Discussed with: CRNA  Anesthesia Plan Comments:         Anesthesia Quick Evaluation

## 2018-02-24 NOTE — Anesthesia Procedure Notes (Signed)
Anesthesia Regional Block: Interscalene brachial plexus block   Pre-Anesthetic Checklist: ,, timeout performed, Correct Patient, Correct Site, Correct Laterality, Correct Procedure, Correct Position, site marked, Risks and benefits discussed,  Surgical consent,  Pre-op evaluation,  At surgeon's request and post-op pain management  Laterality: Right  Prep: chloraprep       Needles:  Injection technique: Single-shot  Needle Type: Echogenic Stimulator Needle     Needle Length: 10cm  Needle Gauge: 20     Additional Needles:   Procedures:, nerve stimulator,,, ultrasound used (permanent image in chart),,,,   Nerve Stimulator or Paresthesia:  Response: biceps flexion,   Additional Responses:   Narrative:  Injection made incrementally with aspirations every 5 mL.  Performed by: Personally   Additional Notes: Functioning IV was confirmed and monitors were applied..  Negative aspiration and negative test dose prior to incremental administration of local anesthetic. The patient tolerated the procedure well. No pain with injection and easy to inject.

## 2018-02-24 NOTE — Transfer of Care (Signed)
Immediate Anesthesia Transfer of Care Note  Patient: Aaron [name].  Procedure(s) Performed: SHOULDER ARTHROSCOPY WITH SUBACROMIAL DEBRIDEMENT, DECOMPRESSION, ROTATOR CUFF REPAIR AND POSSIBLE BICEP TENODESIS (Right )  Patient Location: PACU  Anesthesia Type:GA combined with regional for post-op pain  Level of Consciousness: awake, alert  and patient cooperative  Airway & Oxygen Therapy: Patient Spontanous Breathing and Patient connected to face mask oxygen  Post-op Assessment: Report given to RN and Post -op Vital signs reviewed and stable  Post vital signs: Reviewed and stable  Last Vitals:  Vitals Value Taken Time  BP 175/95 02/24/2018 10:38 AM  Temp 36.3 C 02/24/2018 10:38 AM  Pulse 93 02/24/2018 10:41 AM  Resp 23 02/24/2018 10:41 AM  SpO2 98 % 02/24/2018 10:41 AM  Vitals shown include unvalidated device data.  Last Pain:  Vitals:   02/24/18 1038  TempSrc:   PainSc: Asleep         Complications: No apparent anesthesia complications

## 2018-02-24 NOTE — H&P (Signed)
Paper H&P to be scanned into permanent record. H&P reviewed and patient re-examined. No changes. 

## 2018-02-25 NOTE — Anesthesia Postprocedure Evaluation (Signed)
Anesthesia Post Note  Patient: Aaron [name].  Procedure(s) Performed: SHOULDER ARTHROSCOPY WITH SUBACROMIAL DEBRIDEMENT, DECOMPRESSION, ROTATOR CUFF REPAIR AND POSSIBLE BICEP TENODESIS (Right Shoulder)  Patient location during evaluation: PACU Anesthesia Type: Regional Level of consciousness: awake and alert Pain management: pain level controlled Vital Signs Assessment: post-procedure vital signs reviewed and stable Respiratory status: spontaneous breathing, nonlabored ventilation, respiratory function stable and patient connected to nasal cannula oxygen Cardiovascular status: blood pressure returned to baseline and stable Postop Assessment: no apparent nausea or vomiting Anesthetic complications: no     Last Vitals:  Vitals:   02/24/18 1133 02/24/18 1207  BP: (!) 143/74 (!) 147/80  Pulse: 82 80  Resp: 16 16  Temp: 36.4 C   SpO2: 93% 94%    Last Pain:  Vitals:   02/24/18 1207  TempSrc:   PainSc: 0-No pain                 Yevette Edwards

## 2018-06-28 ENCOUNTER — Other Ambulatory Visit: Payer: Self-pay | Admitting: Surgery

## 2018-06-28 DIAGNOSIS — M24111 Other articular cartilage disorders, right shoulder: Secondary | ICD-10-CM

## 2018-06-28 DIAGNOSIS — M75121 Complete rotator cuff tear or rupture of right shoulder, not specified as traumatic: Secondary | ICD-10-CM

## 2018-06-28 DIAGNOSIS — M7581 Other shoulder lesions, right shoulder: Secondary | ICD-10-CM

## 2018-06-28 DIAGNOSIS — M7521 Bicipital tendinitis, right shoulder: Secondary | ICD-10-CM

## 2018-07-07 ENCOUNTER — Ambulatory Visit
Admission: RE | Admit: 2018-07-07 | Discharge: 2018-07-07 | Disposition: A | Discharge status: Home / Self Care | Payer: Medicare Other | Source: Ambulatory Visit | Attending: Surgery | Admitting: Surgery

## 2018-07-07 DIAGNOSIS — M75121 Complete rotator cuff tear or rupture of right shoulder, not specified as traumatic: Secondary | ICD-10-CM | POA: Insufficient documentation

## 2018-07-07 DIAGNOSIS — M24111 Other articular cartilage disorders, right shoulder: Secondary | ICD-10-CM | POA: Insufficient documentation

## 2018-07-07 DIAGNOSIS — M7581 Other shoulder lesions, right shoulder: Secondary | ICD-10-CM | POA: Diagnosis present

## 2018-07-07 DIAGNOSIS — M7521 Bicipital tendinitis, right shoulder: Secondary | ICD-10-CM | POA: Insufficient documentation

## 2019-02-24 DIAGNOSIS — S46012A Strain of muscle(s) and tendon(s) of the rotator cuff of left shoulder, initial encounter: Secondary | ICD-10-CM | POA: Insufficient documentation

## 2019-02-24 DIAGNOSIS — M7582 Other shoulder lesions, left shoulder: Secondary | ICD-10-CM | POA: Insufficient documentation

## 2019-02-28 ENCOUNTER — Other Ambulatory Visit: Payer: Self-pay | Admitting: Surgery

## 2019-02-28 DIAGNOSIS — S46012A Strain of muscle(s) and tendon(s) of the rotator cuff of left shoulder, initial encounter: Secondary | ICD-10-CM

## 2019-02-28 DIAGNOSIS — M7582 Other shoulder lesions, left shoulder: Secondary | ICD-10-CM

## 2019-03-12 ENCOUNTER — Ambulatory Visit
Admission: RE | Admit: 2019-03-12 | Discharge: 2019-03-12 | Disposition: A | Discharge status: Home / Self Care | Payer: Medicare Other | Source: Ambulatory Visit | Attending: Surgery | Admitting: Surgery

## 2019-03-12 DIAGNOSIS — M7582 Other shoulder lesions, left shoulder: Secondary | ICD-10-CM

## 2019-03-12 DIAGNOSIS — S46012A Strain of muscle(s) and tendon(s) of the rotator cuff of left shoulder, initial encounter: Secondary | ICD-10-CM | POA: Diagnosis present

## 2019-04-18 ENCOUNTER — Other Ambulatory Visit: Payer: Self-pay

## 2019-04-18 ENCOUNTER — Encounter
Admission: RE | Admit: 2019-04-18 | Discharge: 2019-04-18 | Disposition: A | Discharge status: Home / Self Care | Payer: Medicare Other | Source: Ambulatory Visit | Attending: Surgery | Admitting: Surgery

## 2019-04-18 DIAGNOSIS — I252 Old myocardial infarction: Secondary | ICD-10-CM | POA: Insufficient documentation

## 2019-04-18 DIAGNOSIS — Z01818 Encounter for other preprocedural examination: Secondary | ICD-10-CM | POA: Diagnosis not present

## 2019-04-18 LAB — BASIC METABOLIC PANEL
Anion gap: 9 (ref 5–15)
BUN: 25 mg/dL — ABNORMAL HIGH (ref 8–23)
CO2: 24 mmol/L (ref 22–32)
Calcium: 9 mg/dL (ref 8.9–10.3)
Chloride: 104 mmol/L (ref 98–111)
Creatinine, Ser: 1.09 mg/dL (ref 0.61–1.24)
GFR calc Af Amer: >60 mL/min (ref >60)
GFR calc non Af Amer: >60 mL/min (ref >60)
Glucose, Bld: 266 mg/dL — ABNORMAL HIGH (ref 70–99)
Potassium: 4.2 mmol/L (ref 3.5–5.1)
Sodium: 137 mmol/L (ref 135–145)

## 2019-04-18 LAB — CBC
HCT: 42.3 % (ref 39.0–52.0)
Hemoglobin: 14.2 g/dL (ref 13.0–17.0)
MCH: 30.2 pg (ref 26.0–34.0)
MCHC: 33.6 g/dL (ref 30.0–36.0)
MCV: 90 fL (ref 80.0–100.0)
Platelets: 185 10*3/uL (ref 150–400)
RBC: 4.7 MIL/uL (ref 4.22–5.81)
RDW: 14.3 % (ref 11.5–15.5)
WBC: 6.7 10*3/uL (ref 4.0–10.5)
nRBC: 0 % (ref 0.0–0.2)

## 2019-04-18 NOTE — Patient Instructions (Signed)
Your procedure is scheduled on: Tuesday 04/25/19 Report to [facility]. To find out your arrival time please call [phone] between 1PM - 3PM on Monday 04/24/19.  Remember: Instructions that are not followed completely may result in serious medical risk, up to and including death, or upon the discretion of your surgeon and anesthesiologist your surgery may need to be rescheduled.     _X__ 1. Do not eat food after midnight the night before your procedure.                 No gum chewing or hard candies. You may drink clear liquids up to 2 hours                 before you are scheduled to arrive for your surgery- DO not drink clear                 liquids within 2 hours of the start of your surgery.                 Clear Liquids include:  water, apple juice without pulp, clear carbohydrate                 drink such as Clearfast or Gatorade, Black Coffee or Tea (Do not add                 anything to coffee or tea). Diabetics water only  __X__2.  On the morning of surgery brush your teeth with toothpaste and water, you                 may rinse your mouth with mouthwash if you wish.  Do not swallow any              toothpaste of mouthwash.     _X__ 3.  No Alcohol for 24 hours before or after surgery.   _X__ 4.  Do Not Smoke or use e-cigarettes For 24 Hours Prior to Your Surgery.                 Do not use any chewable tobacco products for at least 6 hours prior to                 surgery.  ____  5.  Bring all medications with you on the day of surgery if instructed.   __X__  6.  Notify your doctor if there is any change in your medical condition      (cold, fever, infections).     Do not wear jewelry, make-up, hairpins, clips or nail polish. Do not wear lotions, powders, or perfumes.  Do not shave 48 hours prior to surgery. Men may shave face and neck. Do not bring valuables to the hospital.    [facility] is not responsible for  any belongings or valuables.  Contacts, dentures/partials or body piercings may not be worn into surgery. Bring a case for your contacts, glasses or hearing aids, a denture cup will be supplied. Leave your suitcase in the car. After surgery it may be brought to your room. For patients admitted to the hospital, discharge time is determined by your treatment team.   Patients discharged the day of surgery will not be allowed to drive home.   Please read over the following fact sheets that you were given:   MRSA Information  __X__ Take these medicines the morning of surgery with A SIP OF WATER:  1. May take tramadol if needed  2.   3.   4.  5.  6.  ____ Fleet Enema (as directed)   __X__ Use CHG Soap/SAGE wipes as directed  ____ Use inhalers on the day of surgery  __X__ Stop metformin/Janumet/Farxiga 2 days prior to surgery    ____ Take 1/2 of usual insulin dose the night before surgery. No insulin the morning          of surgery.   ____ Stop Blood Thinners Coumadin/Plavix/Xarelto/Pleta/Pradaxa/Eliquis/Effient/Aspirin  on   Or contact your Surgeon, Cardiologist or Medical Doctor regarding  ability to stop your blood thinners  __X__ Stop Anti-inflammatories 7 days before surgery such as Advil, Ibuprofen, Motrin,  BC or Goodies Powder, Naprosyn, Naproxen, Aleve, Aspirin    __X__ Stop all herbal supplements, fish oil or vitamin E until after surgery.    ____ Bring C-Pap to the hospital.

## 2019-04-19 DIAGNOSIS — M7522 Bicipital tendinitis, left shoulder: Secondary | ICD-10-CM | POA: Insufficient documentation

## 2019-04-21 ENCOUNTER — Other Ambulatory Visit
Admission: RE | Admit: 2019-04-21 | Discharge: 2019-04-21 | Disposition: A | Discharge status: Home / Self Care | Payer: Medicare Other | Source: Ambulatory Visit | Attending: Surgery | Admitting: Surgery

## 2019-04-21 ENCOUNTER — Other Ambulatory Visit: Payer: Self-pay

## 2019-04-21 DIAGNOSIS — Z01812 Encounter for preprocedural laboratory examination: Secondary | ICD-10-CM | POA: Insufficient documentation

## 2019-04-21 DIAGNOSIS — Z20828 Contact with and (suspected) exposure to other viral communicable diseases: Secondary | ICD-10-CM | POA: Diagnosis not present

## 2019-04-21 LAB — SARS CORONAVIRUS 2 (TAT 6-24 HRS): SARS Coronavirus 2: NEGATIVE

## 2019-04-25 ENCOUNTER — Ambulatory Visit: Payer: Medicare Other | Admitting: Anesthesiology

## 2019-04-25 ENCOUNTER — Encounter
Admission: RE | Disposition: A | Discharge status: Home / Self Care | Payer: Self-pay | Source: Home / Self Care | Attending: Surgery

## 2019-04-25 ENCOUNTER — Ambulatory Visit: Payer: Medicare Other

## 2019-04-25 ENCOUNTER — Ambulatory Visit
Admission: RE | Admit: 2019-04-25 | Discharge: 2019-04-25 | Disposition: A | Discharge status: Home / Self Care | Payer: Medicare Other | Attending: Surgery | Admitting: Surgery

## 2019-04-25 ENCOUNTER — Other Ambulatory Visit: Payer: Self-pay

## 2019-04-25 DIAGNOSIS — R079 Chest pain, unspecified: Secondary | ICD-10-CM

## 2019-04-25 DIAGNOSIS — S43492A Other sprain of left shoulder joint, initial encounter: Secondary | ICD-10-CM | POA: Diagnosis not present

## 2019-04-25 DIAGNOSIS — I2510 Atherosclerotic heart disease of native coronary artery without angina pectoris: Secondary | ICD-10-CM | POA: Insufficient documentation

## 2019-04-25 DIAGNOSIS — E119 Type 2 diabetes mellitus without complications: Secondary | ICD-10-CM | POA: Insufficient documentation

## 2019-04-25 DIAGNOSIS — I10 Essential (primary) hypertension: Secondary | ICD-10-CM | POA: Insufficient documentation

## 2019-04-25 DIAGNOSIS — X58XXXA Exposure to other specified factors, initial encounter: Secondary | ICD-10-CM | POA: Diagnosis not present

## 2019-04-25 DIAGNOSIS — M25812 Other specified joint disorders, left shoulder: Secondary | ICD-10-CM | POA: Diagnosis present

## 2019-04-25 DIAGNOSIS — Z96652 Presence of left artificial knee joint: Secondary | ICD-10-CM | POA: Insufficient documentation

## 2019-04-25 DIAGNOSIS — Z7982 Long term (current) use of aspirin: Secondary | ICD-10-CM | POA: Diagnosis not present

## 2019-04-25 DIAGNOSIS — Z882 Allergy status to sulfonamides status: Secondary | ICD-10-CM | POA: Diagnosis not present

## 2019-04-25 DIAGNOSIS — Z955 Presence of coronary angioplasty implant and graft: Secondary | ICD-10-CM | POA: Diagnosis not present

## 2019-04-25 DIAGNOSIS — M7522 Bicipital tendinitis, left shoulder: Secondary | ICD-10-CM | POA: Diagnosis not present

## 2019-04-25 DIAGNOSIS — Z79899 Other long term (current) drug therapy: Secondary | ICD-10-CM | POA: Diagnosis not present

## 2019-04-25 DIAGNOSIS — I252 Old myocardial infarction: Secondary | ICD-10-CM | POA: Diagnosis not present

## 2019-04-25 DIAGNOSIS — Z7984 Long term (current) use of oral hypoglycemic drugs: Secondary | ICD-10-CM | POA: Insufficient documentation

## 2019-04-25 DIAGNOSIS — Z888 Allergy status to other drugs, medicaments and biological substances status: Secondary | ICD-10-CM | POA: Diagnosis not present

## 2019-04-25 DIAGNOSIS — E785 Hyperlipidemia, unspecified: Secondary | ICD-10-CM | POA: Insufficient documentation

## 2019-04-25 DIAGNOSIS — M75112 Incomplete rotator cuff tear or rupture of left shoulder, not specified as traumatic: Secondary | ICD-10-CM | POA: Diagnosis not present

## 2019-04-25 HISTORY — PX: SHOULDER ARTHROSCOPY WITH SUBACROMIAL DECOMPRESSION AND BICEP TENDON REPAIR: SHX5689

## 2019-04-25 LAB — GLUCOSE, CAPILLARY
Glucose-Capillary: 304 mg/dL — ABNORMAL HIGH (ref 70–99)
Glucose-Capillary: 317 mg/dL — ABNORMAL HIGH (ref 70–99)
Glucose-Capillary: 279 mg/dL — ABNORMAL HIGH (ref 70–99)
Glucose-Capillary: 266 mg/dL — ABNORMAL HIGH (ref 70–99)

## 2019-04-25 SURGERY — SHOULDER ARTHROSCOPY WITH SUBACROMIAL DECOMPRESSION AND BICEP TENDON REPAIR
Anesthesia: General | Site: Shoulder | Laterality: Left

## 2019-04-25 MED ORDER — FENTANYL CITRATE (PF) 100 MCG/2ML IJ SOLN: 25.0000 ug | INTRAMUSCULAR | Status: DC | PRN

## 2019-04-25 MED ORDER — FENTANYL CITRATE (PF) 250 MCG/5ML IJ SOLN
INTRAMUSCULAR | Status: AC
  Filled 2019-04-25: qty 5

## 2019-04-25 MED ORDER — MORPHINE SULFATE (PF) 4 MG/ML IV SOLN
2.0000 mg | Freq: Once | INTRAVENOUS | Status: AC
  Administered 2019-04-25: 13:00:00 2 mg via INTRAVENOUS

## 2019-04-25 MED ORDER — PROPOFOL 10 MG/ML IV BOLUS
INTRAVENOUS | Status: AC
  Filled 2019-04-25: qty 20

## 2019-04-25 MED ORDER — LIDOCAINE HCL (PF) 2 % IJ SOLN
INTRAMUSCULAR | Status: AC
  Filled 2019-04-25: qty 10

## 2019-04-25 MED ORDER — ROCURONIUM BROMIDE 100 MG/10ML IV SOLN
INTRAVENOUS | Status: DC | PRN
  Administered 2019-04-25: 100 mg via INTRAVENOUS

## 2019-04-25 MED ORDER — FENTANYL CITRATE (PF) 100 MCG/2ML IJ SOLN
50.0000 ug | Freq: Once | INTRAMUSCULAR | Status: AC
  Administered 2019-04-25: 09:00:00 50 ug via INTRAVENOUS

## 2019-04-25 MED ORDER — PHENYLEPHRINE HCL-NACL 10-0.9 MG/250ML-% IV SOLN
INTRAVENOUS | Status: DC | PRN
  Administered 2019-04-25: 30 ug/min via INTRAVENOUS

## 2019-04-25 MED ORDER — MORPHINE SULFATE (PF) 4 MG/ML IV SOLN
INTRAVENOUS | Status: AC
  Administered 2019-04-25: 13:00:00 2 mg via INTRAVENOUS
  Filled 2019-04-25: qty 1

## 2019-04-25 MED ORDER — CEFAZOLIN SODIUM-DEXTROSE 2-4 GM/100ML-% IV SOLN
INTRAVENOUS | Status: AC
  Filled 2019-04-25: qty 100

## 2019-04-25 MED ORDER — BUPIVACAINE HCL (PF) 0.5 % IJ SOLN
INTRAMUSCULAR | Status: AC
  Filled 2019-04-25: qty 10

## 2019-04-25 MED ORDER — SUGAMMADEX SODIUM 500 MG/5ML IV SOLN
INTRAVENOUS | Status: AC
  Filled 2019-04-25: qty 5

## 2019-04-25 MED ORDER — EPINEPHRINE PF 1 MG/ML IJ SOLN
INTRAMUSCULAR | Status: AC
  Filled 2019-04-25: qty 1

## 2019-04-25 MED ORDER — INSULIN ASPART 100 UNIT/ML ~~LOC~~ SOLN
8.0000 [IU] | Freq: Once | SUBCUTANEOUS | Status: AC
  Administered 2019-04-25: 09:00:00 8 [IU] via SUBCUTANEOUS

## 2019-04-25 MED ORDER — PROPOFOL 10 MG/ML IV BOLUS
INTRAVENOUS | Status: DC | PRN
  Administered 2019-04-25: 170 mg via INTRAVENOUS

## 2019-04-25 MED ORDER — ONDANSETRON HCL 4 MG/2ML IJ SOLN
INTRAMUSCULAR | Status: AC
  Filled 2019-04-25: qty 2

## 2019-04-25 MED ORDER — BUPIVACAINE LIPOSOME 1.3 % IJ SUSP
INTRAMUSCULAR | Status: DC | PRN
  Administered 2019-04-25: 20 mL via PERINEURAL

## 2019-04-25 MED ORDER — INSULIN ASPART 100 UNIT/ML ~~LOC~~ SOLN
5.0000 [IU] | Freq: Once | SUBCUTANEOUS | Status: AC
  Administered 2019-04-25: 5 [IU] via SUBCUTANEOUS

## 2019-04-25 MED ORDER — MIDAZOLAM HCL 2 MG/2ML IJ SOLN
INTRAMUSCULAR | Status: AC
  Filled 2019-04-25: qty 2

## 2019-04-25 MED ORDER — LIDOCAINE HCL (PF) 1 % IJ SOLN
INTRAMUSCULAR | Status: DC | PRN
  Administered 2019-04-25: .8 mL via SUBCUTANEOUS

## 2019-04-25 MED ORDER — MIDAZOLAM HCL 2 MG/2ML IJ SOLN
1.0000 mg | Freq: Once | INTRAMUSCULAR | Status: AC
  Administered 2019-04-25: 09:00:00 1 mg via INTRAVENOUS

## 2019-04-25 MED ORDER — EPINEPHRINE PF 1 MG/ML IJ SOLN
INTRAMUSCULAR | Status: DC | PRN
  Administered 2019-04-25: 1 mg

## 2019-04-25 MED ORDER — BUPIVACAINE LIPOSOME 1.3 % IJ SUSP
INTRAMUSCULAR | Status: AC
  Filled 2019-04-25: qty 20

## 2019-04-25 MED ORDER — SODIUM CHLORIDE 0.9 % IV SOLN
INTRAVENOUS | Status: DC
  Administered 2019-04-25: 09:00:00 via INTRAVENOUS

## 2019-04-25 MED ORDER — FENTANYL CITRATE (PF) 100 MCG/2ML IJ SOLN
INTRAMUSCULAR | Status: AC
  Filled 2019-04-25: qty 2

## 2019-04-25 MED ORDER — OXYCODONE HCL 5 MG PO TABS: 5.0000 mg | ORAL_TABLET | ORAL | 0 refills | Status: DC | PRN

## 2019-04-25 MED ORDER — LIDOCAINE HCL (PF) 1 % IJ SOLN
INTRAMUSCULAR | Status: AC
  Filled 2019-04-25: qty 5

## 2019-04-25 MED ORDER — INSULIN ASPART 100 UNIT/ML ~~LOC~~ SOLN
SUBCUTANEOUS | Status: AC
  Filled 2019-04-25: qty 1

## 2019-04-25 MED ORDER — ROCURONIUM BROMIDE 50 MG/5ML IV SOLN
INTRAVENOUS | Status: AC
  Filled 2019-04-25: qty 2

## 2019-04-25 MED ORDER — BUPIVACAINE-EPINEPHRINE (PF) 0.5% -1:200000 IJ SOLN
INTRAMUSCULAR | Status: DC | PRN
  Administered 2019-04-25: 30 mL via PERINEURAL

## 2019-04-25 MED ORDER — CEFAZOLIN SODIUM-DEXTROSE 2-4 GM/100ML-% IV SOLN
2.0000 g | Freq: Once | INTRAVENOUS | Status: AC
  Administered 2019-04-25: 2 g via INTRAVENOUS

## 2019-04-25 MED ORDER — ONDANSETRON HCL 4 MG/2ML IJ SOLN: 4.0000 mg | Freq: Once | INTRAMUSCULAR | Status: DC | PRN

## 2019-04-25 MED ORDER — FAMOTIDINE 20 MG PO TABS
ORAL_TABLET | ORAL | Status: AC
  Administered 2019-04-25: 20 mg via ORAL
  Filled 2019-04-25: qty 1

## 2019-04-25 MED ORDER — FENTANYL CITRATE (PF) 100 MCG/2ML IJ SOLN
INTRAMUSCULAR | Status: DC | PRN
  Administered 2019-04-25: 50 ug via INTRAVENOUS
  Administered 2019-04-25: 25 ug via INTRAVENOUS
  Administered 2019-04-25: 75 ug via INTRAVENOUS

## 2019-04-25 MED ORDER — ONDANSETRON HCL 4 MG/2ML IJ SOLN
INTRAMUSCULAR | Status: DC | PRN
  Administered 2019-04-25: 4 mg via INTRAVENOUS

## 2019-04-25 MED ORDER — FAMOTIDINE 20 MG PO TABS
20.0000 mg | ORAL_TABLET | Freq: Once | ORAL | Status: AC
  Administered 2019-04-25: 09:00:00 20 mg via ORAL

## 2019-04-25 MED ORDER — INSULIN REGULAR HUMAN 100 UNIT/ML IJ SOLN: 8.0000 [IU] | Freq: Once | INTRAMUSCULAR | Status: DC

## 2019-04-25 MED ORDER — BUPIVACAINE HCL (PF) 0.5 % IJ SOLN
INTRAMUSCULAR | Status: DC | PRN
  Administered 2019-04-25: 10 mL via PERINEURAL

## 2019-04-25 MED ORDER — SUGAMMADEX SODIUM 200 MG/2ML IV SOLN
INTRAVENOUS | Status: DC | PRN
  Administered 2019-04-25: 200 mg via INTRAVENOUS

## 2019-04-25 MED ORDER — LIDOCAINE HCL (CARDIAC) PF 100 MG/5ML IV SOSY
PREFILLED_SYRINGE | INTRAVENOUS | Status: DC | PRN
  Administered 2019-04-25: 100 mg via INTRAVENOUS

## 2019-04-25 SURGICAL SUPPLY — 46 items
ANCHOR BONE REGENETEN (Anchor) ×2 IMPLANT
ANCHOR JUGGERKNOT WTAP NDL 2.9 (Anchor) ×2 IMPLANT
ANCHOR TENDON REGENETEN (Staple) ×2 IMPLANT
BIT DRILL JUGRKNT W/NDL BIT2.9 (DRILL) IMPLANT
BLADE FULL RADIUS 3.5 (BLADE) ×3 IMPLANT
BUR ACROMIONIZER 4.0 (BURR) ×3 IMPLANT
CANNULA SHAVER 8MMX76MM (CANNULA) ×3 IMPLANT
CHLORAPREP W/TINT 26 (MISCELLANEOUS) ×3 IMPLANT
COVER MAYO STAND REUSABLE (DRAPES) ×3 IMPLANT
COVER WAND RF STERILE (DRAPES) ×3 IMPLANT
DRAPE SPLIT 6X30 W/TAPE (DRAPES) ×6 IMPLANT
DRILL JUGGERKNOT W/NDL BIT 2.9 (DRILL) ×3
ELECT REM PT RETURN 9FT ADLT (ELECTROSURGICAL) ×3
ELECTRODE REM PT RTRN 9FT ADLT (ELECTROSURGICAL) ×1 IMPLANT
GAUZE SPONGE 4X4 12PLY STRL (GAUZE/BANDAGES/DRESSINGS) ×3 IMPLANT
GAUZE XEROFORM 1X8 LF (GAUZE/BANDAGES/DRESSINGS) ×3 IMPLANT
GLOVE BIO SURGEON STRL SZ7.5 (GLOVE) ×6 IMPLANT
GLOVE BIO SURGEON STRL SZ8 (GLOVE) ×6 IMPLANT
GLOVE BIOGEL PI IND STRL 8 (GLOVE) ×1 IMPLANT
GLOVE BIOGEL PI INDICATOR 8 (GLOVE) ×2
GLOVE INDICATOR 8.0 STRL GRN (GLOVE) ×3 IMPLANT
GOWN STRL REUS W/ TWL LRG LVL3 (GOWN DISPOSABLE) ×1 IMPLANT
GOWN STRL REUS W/ TWL XL LVL3 (GOWN DISPOSABLE) ×1 IMPLANT
GOWN STRL REUS W/TWL LRG LVL3 (GOWN DISPOSABLE) ×6
GOWN STRL REUS W/TWL XL LVL3 (GOWN DISPOSABLE) ×2
GRASPER SUT 15 45D LOW PRO (SUTURE) IMPLANT
IMPL REGENETEN MEDIUM (Shoulder) IMPLANT
IMPLANT REGENETEN MEDIUM (Shoulder) ×3 IMPLANT
IV LACTATED RINGER IRRG 3000ML (IV SOLUTION) ×2
IV LR IRRIG 3000ML ARTHROMATIC (IV SOLUTION) ×1 IMPLANT
MANIFOLD NEPTUNE II (INSTRUMENTS) ×3 IMPLANT
MASK FACE SPIDER DISP (MASK) ×3 IMPLANT
MAT ABSORB  FLUID 56X50 GRAY (MISCELLANEOUS) ×2
MAT ABSORB FLUID 56X50 GRAY (MISCELLANEOUS) ×1 IMPLANT
NEEDLE HYPO 22GX1.5 SAFETY (NEEDLE) ×3 IMPLANT
PACK ARTHROSCOPY SHOULDER (MISCELLANEOUS) ×3 IMPLANT
SLING ARM LRG DEEP (SOFTGOODS) ×3 IMPLANT
SLING ULTRA II LG (MISCELLANEOUS) ×1 IMPLANT
STAPLER SKIN PROX 35W (STAPLE) ×3 IMPLANT
STRAP SAFETY 5IN WIDE (MISCELLANEOUS) ×3 IMPLANT
SUT ETHIBOND 0 MO6 C/R (SUTURE) ×3 IMPLANT
SUT VIC AB 2-0 CT1 27 (SUTURE) ×4
SUT VIC AB 2-0 CT1 TAPERPNT 27 (SUTURE) ×2 IMPLANT
TAPE MICROFOAM 4IN (TAPE) ×3 IMPLANT
TUBING ARTHRO INFLOW-ONLY STRL (TUBING) ×3 IMPLANT
WAND WEREWOLF FLOW 90D (MISCELLANEOUS) ×3 IMPLANT

## 2019-04-25 NOTE — Op Note (Signed)
04/25/2019  11:38 AM  Patient:   Aaron [name].  Pre-Op Diagnosis:   Impingement/tendinopathy with biceps tendinopathy, left shoulder.  Post-Op Diagnosis:   Impingement/tendinopathy with partial-thickness rotator cuff tear, degenerative labral tear, and biceps tendinopathy, left shoulder.  Procedure:   Limited arthroscopic debridement, arthroscopic subacromial decompression, mini-open rotator cuff repair using a Smith [employer] Nephew Regeneten patch, and mini-open biceps tenodesis, left shoulder.  Anesthesia:   General endotracheal with interscalene block using Exparel placed preoperatively by the anesthesiologist.  Surgeon:   Pascal Lux, MD  Assistant:   Cameron Proud, [name]; Erin Mecum, PA-S  Findings:   As above. There was moderate degenerative fraying of the labrum superiorly and posterior superiorly with a Buford complex anterosuperiorly. Grade 1 chondromalacial changes were noted in the glenoid. There were articular and bursal surface partial-thickness tears involving the supraspinatus tendon each involving less than 10 to 15% of the tendon thickness. The remainder of the rotator cuff was in satisfactory condition. The biceps tendon was extensively degenerative with partial-thickness tearing.  Complications:   None  Fluids:   700 cc  Estimated blood loss:   10 cc  Tourniquet time:   None  Drains:   None  Closure:   Staples      Brief clinical note:   The patient is a 73 year old male with a history of progressively worsening left shoulder pain. The patient's symptoms have progressed despite medications, activity modification, etc. The patient's history and examination are consistent with impingement/tendinopathy with a rotator cuff tear. These findings were confirmed by MRI scan. The patient presents at this time for definitive management of these shoulder symptoms.  Procedure:   The patient underwent placement of an interscalene block using Exparel by the anesthesiologist  in the preoperative holding area before being brought into the operating room and lain in the supine position. The patient then underwent general endotracheal intubation and anesthesia before being repositioned in the beach chair position using the beach chair positioner. The left shoulder and upper extremity were prepped with ChloraPrep solution before being draped sterilely. Preoperative antibiotics were administered. A timeout was performed to confirm the proper surgical site before the expected portal sites and incision site were injected with 0.5% Sensorcaine with epinephrine. A posterior portal was created and the glenohumeral joint thoroughly inspected with the findings as described above. An anterior portal was created using an outside-in technique. The labrum and rotator cuff were further probed, again confirming the above-noted findings. The areas of labral fraying were debrided back to stable margins using the full-radius resector, as were areas of synovitis anterosuperiorly. In addition, the area of articular sided partial-thickness tearing involving the supraspinatus tendon was debrided back to stable margins using the full-radius resector. The ArthroCare wand was inserted and used to release the biceps tendon from its labral anchor.  It also was used to obtain hemostasis as well as to "anneal" the labrum superiorly and anteriorly. The instruments were removed from the joint after suctioning the excess fluid.  The camera was repositioned through the posterior portal into the subacromial space. A separate lateral portal was created using an outside-in technique. The 3.5 mm full-radius resector was introduced and used to perform a subtotal bursectomy. The ArthroCare wand was then inserted and used to remove the periosteal tissue off the undersurface of the anterior third of the acromion as well as to recess the coracoacromial ligament from its attachment along the anterior and lateral margins of the  acromion. The 4.0 mm acromionizing bur was introduced  and used to complete the decompression by removing the undersurface of the anterior third of the acromion. The full radius resector was reintroduced to remove any residual bony debris before the ArthroCare wand was reintroduced to obtain hemostasis. The instruments were then removed from the subacromial space after suctioning the excess fluid.  An approximately 4-5 cm incision was made over the anterolateral aspect of the shoulder beginning at the anterolateral corner of the acromion and extending distally in line with the bicipital groove. This incision was carried down through the subcutaneous tissues to expose the deltoid fascia. The raphae between the anterior and middle thirds was identified and this plane developed to provide access into the subacromial space. Additional bursal tissues were debrided sharply using Metzenbaum scissors. The rotator cuff was readily identified and carefully inspected.  There was an area of bursal sided partial-thickness tearing involving an approximately 1.5 x 1.8 cm area of the supraspinatus tendon. This area was covered with a medium-sized Smith [employer] Nephew Regeneten patch which was secured using the appropriate bone and soft tissue staples. An apparent watertight closure was obtained.  The bicipital groove was identified by palpation and opened for 1-1.5 cm. The biceps tendon stump was retrieved through this defect. The floor of the bicipital groove was roughened with a curet before a single Biomet 2.9 mm JuggerKnot anchor was inserted. Both sets of sutures were passed through the biceps tendon and tied securely to effect the tenodesis. The bicipital sheath was reapproximated using two #0 Ethibond interrupted sutures, incorporating the biceps tendon to further reinforce the tenodesis.  The wound was copiously irrigated with sterile saline solution before the deltoid raphae was reapproximated using 2-0 Vicryl interrupted  sutures. The subcutaneous tissues were closed in two layers using 2-0 Vicryl interrupted sutures before the skin was closed using staples. The portal sites also were closed using staples. A sterile bulky dressing was applied to the shoulder before the arm was placed into a shoulder immobilizer. The patient was then awakened, extubated, and returned to the recovery room in satisfactory condition after tolerating the procedure well.

## 2019-04-25 NOTE — H&P (Signed)
Paper H&P to be scanned into permanent record. H&P reviewed and patient re-examined. No changes. 

## 2019-04-25 NOTE — Anesthesia Post-op Follow-up Note (Signed)
Anesthesia QCDR form completed.        

## 2019-04-25 NOTE — OR Nursing (Signed)
Patient continues to complain of intermittent stabbing pain left chest with feeling of "pressure when not having stabbing pain". Denies shortness of breath but states, "It's worse when I take a deep breath."  Skin warm and dry. Dr. Kayleen Memos at bedside.

## 2019-04-25 NOTE — Anesthesia Procedure Notes (Signed)
Anesthesia Regional Block: Interscalene brachial plexus block   Pre-Anesthetic Checklist: ,, timeout performed, Correct Patient, Correct Site, Correct Laterality, Correct Procedure, Correct Position, site marked, Risks and benefits discussed,  Surgical consent,  Pre-op evaluation,  At surgeon's request and post-op pain management  Laterality: Left  Prep: chloraprep       Needles:  Injection technique: Single-shot  Needle Type: Echogenic Needle     Needle Length: 9cm  Needle Gauge: 21     Additional Needles:   Procedures:, nerve stimulator,,, ultrasound used (permanent image in chart),,,,   Nerve Stimulator or Paresthesia:  Response: biceps flexion, 0.8 mA,   Additional Responses:   Narrative:  Start time: 04/25/2019 9:19 AM End time: 04/25/2019 9:22 AM Injection made incrementally with aspirations every 5 mL.  Performed by: Personally   Additional Notes: Functioning IV was confirmed and monitors were applied.  A 59mm 22ga Stimuplex needle was used. Sterile prep and drape,hand hygiene and sterile gloves were used.  Negative aspiration and negative test dose prior to incremental administration of local anesthetic. The patient tolerated the procedure well.

## 2019-04-25 NOTE — Anesthesia Postprocedure Evaluation (Signed)
Anesthesia Post Note  Patient: Aaron [name].  Procedure(s) Performed: SHOULDER ARTHROSCOPY WITH DEBRIDEMENT, DECOMPRESSION AND BICEP TENDON REPAIR, ROTATOR CUFF REPAIR (Left Shoulder)  Patient location during evaluation: PACU Anesthesia Type: General Level of consciousness: awake and alert and oriented Pain management: pain level controlled Vital Signs Assessment: post-procedure vital signs reviewed and stable Respiratory status: spontaneous breathing Cardiovascular status: blood pressure returned to baseline Anesthetic complications: no Comments: Patient complained of some transient sharp left chest discomfort which increased with deep breathing.  The pain resolved within 15-20 minutes after some pain control.  An EKG was unremarkable for any acute signs and a chest Xray was obtained to rule out pneumothorax( pending).  I talked with the cardiologist, Serafina Royals, and he felt comfortable watching the patient in the PACU and discharge to home if no issues.       Last Vitals:  Vitals:   04/25/19 1233 04/25/19 1248  BP: (!) 159/77 (!) 156/84  Pulse: 84 80  Resp: 17 17  Temp:    SpO2: 97% 97%    Last Pain:  Vitals:   04/25/19 1318  TempSrc:   PainSc: 2                  Max Nuno

## 2019-04-25 NOTE — OR Nursing (Signed)
Dr Ronelle Nigh called with fsbs of [phone] no new orders at this time

## 2019-04-25 NOTE — Discharge Instructions (Addendum)
Orthopedic discharge instructions: Keep dressing dry and intact.  May shower after dressing changed on post-op day #4 (Saturday).  Cover staples with Band-Aids after drying off. Apply ice frequently to shoulder. Take ibuprofen 800 mg TID with meals for 7-10 days, then as necessary. Take oxycodone as prescribed when needed.  May supplement with ES Tylenol if necessary. Keep shoulder immobilizer on at all times except may remove for bathing purposes. Follow-up in 10-14 days or as scheduled.  AMBULATORY SURGERY  DISCHARGE INSTRUCTIONS   1) The drugs that you were given will stay in your system until tomorrow so for the next 24 hours you should not:  A) Drive an automobile B) Make any legal decisions C) Drink any alcoholic beverage   2) You may resume regular meals tomorrow.  Today it is better to start with liquids and gradually work up to solid foods.  You may eat anything you prefer, but it is better to start with liquids, then soup and crackers, and gradually work up to solid foods.   3) Please notify your doctor immediately if you have any unusual bleeding, trouble breathing, redness and pain at the surgery site, drainage, fever, or pain not relieved by medication.    4) Additional Instructions:        Please contact your physician with any problems or Same Day Surgery at [phone], Monday through Friday 6 am to 4 pm, or [place] [facility] number at [phone].

## 2019-04-25 NOTE — Anesthesia Preprocedure Evaluation (Signed)
Anesthesia Evaluation  Patient identified by MRN, date of birth, ID band Patient awake    Reviewed: Allergy & Precautions, NPO status , Patient's Chart, lab work & pertinent test results  History of Anesthesia Complications Negative for: history of anesthetic complications  Airway Mallampati: III       Dental   Pulmonary neg sleep apnea, neg COPD, former smoker,           Cardiovascular (-) hypertension+ Past MI  (-) CHF (-) dysrhythmias (-) Valvular Problems/Murmurs     Neuro/Psych neg Seizures    GI/Hepatic Neg liver ROS, neg GERD  ,  Endo/Other  diabetes, Type 2, Oral Hypoglycemic Agents  Renal/GU negative Renal ROS     Musculoskeletal   Abdominal   Peds  Hematology   Anesthesia Other Findings   Reproductive/Obstetrics                             Anesthesia Physical Anesthesia Plan  ASA: III  Anesthesia Plan: General   Post-op Pain Management: GA combined w/ Regional for post-op pain   Induction: Intravenous  PONV Risk Score and Plan: 2 and Ondansetron and Midazolam  Airway Management Planned: Oral ETT  Additional Equipment:   Intra-op Plan:   Post-operative Plan:   Informed Consent: I have reviewed the patients History and Physical, chart, labs and discussed the procedure including the risks, benefits and alternatives for the proposed anesthesia with the patient or authorized representative who has indicated his/her understanding and acceptance.       Plan Discussed with:   Anesthesia Plan Comments:         Anesthesia Quick Evaluation

## 2019-04-25 NOTE — Anesthesia Procedure Notes (Signed)
Procedure Name: Intubation Date/Time: 04/25/2019 10:14 AM Performed by: Bernardo Heater, CRNA Pre-anesthesia Checklist: Patient identified, Patient being monitored, Timeout performed, Emergency Drugs available and Suction available Patient Re-evaluated:Patient Re-evaluated prior to induction Oxygen Delivery Method: Circle system utilized Preoxygenation: Pre-oxygenation with 100% oxygen Induction Type: IV induction Laryngoscope Size: Mac and 3 Grade View: Grade I Tube type: Oral Tube size: 7.0 mm Number of attempts: 1 Placement Confirmation: ETT inserted through vocal cords under direct vision,  positive ETCO2 and breath sounds checked- equal and bilateral Secured at: 21 cm Tube secured with: Tape Dental Injury: Teeth and Oropharynx as per pre-operative assessment

## 2019-04-25 NOTE — Transfer of Care (Signed)
Immediate Anesthesia Transfer of Care Note  Patient: Aaron [name].  Procedure(s) Performed: SHOULDER ARTHROSCOPY WITH DEBRIDEMENT, DECOMPRESSION AND BICEP TENDON REPAIR, ROTATOR CUFF REPAIR (Left Shoulder)  Patient Location: PACU  Anesthesia Type:General  Level of Consciousness: awake, alert  and oriented  Airway & Oxygen Therapy: Patient Spontanous Breathing and Patient connected to nasal cannula oxygen  Post-op Assessment: Report given to RN and Post -op Vital signs reviewed and stable  Post vital signs: Reviewed and stable  Last Vitals:  Vitals Value Taken Time  BP 185/87 04/25/19 1203  Temp 36.1 C 04/25/19 1203  Pulse 93 04/25/19 1203  Resp 17 04/25/19 1203  SpO2 95 % 04/25/19 1203    Last Pain:  Vitals:   04/25/19 0957  TempSrc:   PainSc: 0-No pain         Complications: No apparent anesthesia complications

## 2019-04-26 DIAGNOSIS — M24112 Other articular cartilage disorders, left shoulder: Secondary | ICD-10-CM | POA: Insufficient documentation

## 2019-06-12 ENCOUNTER — Emergency Department: Payer: Medicare Other

## 2019-06-12 ENCOUNTER — Other Ambulatory Visit: Payer: Self-pay

## 2019-06-12 ENCOUNTER — Inpatient Hospital Stay
Admission: EM | Admit: 2019-06-12 | Discharge: 2019-06-14 | DRG: 247 | Disposition: A | Discharge status: Home / Self Care | Payer: Medicare Other | Source: Ambulatory Visit | Attending: Internal Medicine | Admitting: Internal Medicine

## 2019-06-12 DIAGNOSIS — Z888 Allergy status to other drugs, medicaments and biological substances status: Secondary | ICD-10-CM

## 2019-06-12 DIAGNOSIS — Z882 Allergy status to sulfonamides status: Secondary | ICD-10-CM

## 2019-06-12 DIAGNOSIS — Z20828 Contact with and (suspected) exposure to other viral communicable diseases: Secondary | ICD-10-CM | POA: Diagnosis present

## 2019-06-12 DIAGNOSIS — Z87891 Personal history of nicotine dependence: Secondary | ICD-10-CM

## 2019-06-12 DIAGNOSIS — I25110 Atherosclerotic heart disease of native coronary artery with unstable angina pectoris: Secondary | ICD-10-CM | POA: Diagnosis present

## 2019-06-12 DIAGNOSIS — N1831 Chronic kidney disease, stage 3a: Secondary | ICD-10-CM | POA: Diagnosis present

## 2019-06-12 DIAGNOSIS — Z7984 Long term (current) use of oral hypoglycemic drugs: Secondary | ICD-10-CM

## 2019-06-12 DIAGNOSIS — R079 Chest pain, unspecified: Secondary | ICD-10-CM | POA: Diagnosis present

## 2019-06-12 DIAGNOSIS — I214 Non-ST elevation (NSTEMI) myocardial infarction: Principal | ICD-10-CM | POA: Diagnosis present

## 2019-06-12 DIAGNOSIS — I2510 Atherosclerotic heart disease of native coronary artery without angina pectoris: Secondary | ICD-10-CM | POA: Diagnosis present

## 2019-06-12 DIAGNOSIS — R195 Other fecal abnormalities: Secondary | ICD-10-CM | POA: Diagnosis present

## 2019-06-12 DIAGNOSIS — E1122 Type 2 diabetes mellitus with diabetic chronic kidney disease: Secondary | ICD-10-CM | POA: Diagnosis present

## 2019-06-12 DIAGNOSIS — E1129 Type 2 diabetes mellitus with other diabetic kidney complication: Secondary | ICD-10-CM | POA: Diagnosis present

## 2019-06-12 DIAGNOSIS — Z955 Presence of coronary angioplasty implant and graft: Secondary | ICD-10-CM

## 2019-06-12 DIAGNOSIS — E119 Type 2 diabetes mellitus without complications: Secondary | ICD-10-CM | POA: Diagnosis present

## 2019-06-12 DIAGNOSIS — E785 Hyperlipidemia, unspecified: Secondary | ICD-10-CM | POA: Diagnosis present

## 2019-06-12 DIAGNOSIS — N183 Chronic kidney disease, stage 3 unspecified: Secondary | ICD-10-CM | POA: Diagnosis present

## 2019-06-12 DIAGNOSIS — I252 Old myocardial infarction: Secondary | ICD-10-CM

## 2019-06-12 HISTORY — DX: Non-ST elevation (NSTEMI) myocardial infarction: I21.4

## 2019-06-12 LAB — CBC
HCT: 42.5 % (ref 39.0–52.0)
HCT: 40.2 % (ref 39.0–52.0)
HCT: 42.5 % (ref 39.0–52.0)
Hemoglobin: 14.3 g/dL (ref 13.0–17.0)
Hemoglobin: 13.5 g/dL (ref 13.0–17.0)
Hemoglobin: 14.8 g/dL (ref 13.0–17.0)
MCH: 29.5 pg (ref 26.0–34.0)
MCH: 29.7 pg (ref 26.0–34.0)
MCH: 29.7 pg (ref 26.0–34.0)
MCHC: 33.6 g/dL (ref 30.0–36.0)
MCHC: 33.6 g/dL (ref 30.0–36.0)
MCHC: 34.8 g/dL (ref 30.0–36.0)
MCV: 87.8 fL (ref 80.0–100.0)
MCV: 88.4 fL (ref 80.0–100.0)
MCV: 85.3 fL (ref 80.0–100.0)
Platelets: 221 10*3/uL (ref 150–400)
Platelets: 197 10*3/uL (ref 150–400)
Platelets: 243 10*3/uL (ref 150–400)
RBC: 4.84 MIL/uL (ref 4.22–5.81)
RBC: 4.55 MIL/uL (ref 4.22–5.81)
RBC: 4.98 MIL/uL (ref 4.22–5.81)
RDW: 15.4 % (ref 11.5–15.5)
RDW: 15.4 % (ref 11.5–15.5)
RDW: 15.6 % — ABNORMAL HIGH (ref 11.5–15.5)
WBC: 7.4 10*3/uL (ref 4.0–10.5)
WBC: 7.4 10*3/uL (ref 4.0–10.5)
WBC: 8.9 10*3/uL (ref 4.0–10.5)
nRBC: 0 % (ref 0.0–0.2)
nRBC: 0 % (ref 0.0–0.2)
nRBC: 0 % (ref 0.0–0.2)

## 2019-06-12 LAB — PROTIME-INR
INR: 1 (ref 0.8–1.2)
Prothrombin Time: 12.8 seconds (ref 11.4–15.2)

## 2019-06-12 LAB — BASIC METABOLIC PANEL
Anion gap: 10 (ref 5–15)
BUN: 18 mg/dL (ref 8–23)
CO2: 25 mmol/L (ref 22–32)
Calcium: 8.8 mg/dL — ABNORMAL LOW (ref 8.9–10.3)
Chloride: 99 mmol/L (ref 98–111)
Creatinine, Ser: 1.23 mg/dL (ref 0.61–1.24)
GFR calc Af Amer: >60 mL/min (ref >60)
GFR calc non Af Amer: 57 mL/min — ABNORMAL LOW (ref >60)
Glucose, Bld: 351 mg/dL — ABNORMAL HIGH (ref 70–99)
Potassium: 4.6 mmol/L (ref 3.5–5.1)
Sodium: 134 mmol/L — ABNORMAL LOW (ref 135–145)

## 2019-06-12 LAB — TROPONIN I (HIGH SENSITIVITY)
Troponin I (High Sensitivity): 169 ng/L (ref <18)
Troponin I (High Sensitivity): 1013 ng/L (ref <18)
Troponin I (High Sensitivity): 5254 ng/L (ref <18)

## 2019-06-12 LAB — GLUCOSE, CAPILLARY
Glucose-Capillary: 237 mg/dL — ABNORMAL HIGH (ref 70–99)
Glucose-Capillary: 198 mg/dL — ABNORMAL HIGH (ref 70–99)

## 2019-06-12 LAB — SARS CORONAVIRUS 2 (TAT 6-24 HRS): SARS Coronavirus 2: NEGATIVE

## 2019-06-12 LAB — HEMOGLOBIN A1C
Hgb A1c MFr Bld: 10.6 % — ABNORMAL HIGH (ref 4.8–5.6)
Mean Plasma Glucose: 257.52 mg/dL

## 2019-06-12 LAB — APTT: aPTT: 30 seconds (ref 24–36)

## 2019-06-12 LAB — TYPE AND SCREEN
ABO/RH(D): O POS
Antibody Screen: NEGATIVE

## 2019-06-12 LAB — HEPARIN LEVEL (UNFRACTIONATED): Heparin Unfractionated: 0.16 IU/mL — ABNORMAL LOW (ref 0.30–0.70)

## 2019-06-12 MED ORDER — SODIUM CHLORIDE 0.9 % IV SOLN: INTRAVENOUS | Status: DC

## 2019-06-12 MED ORDER — INSULIN ASPART 100 UNIT/ML ~~LOC~~ SOLN
0.0000 [IU] | Freq: Every day | SUBCUTANEOUS | Status: DC
  Administered 2019-06-13: 4 [IU] via SUBCUTANEOUS
  Filled 2019-06-12: qty 1

## 2019-06-12 MED ORDER — SODIUM CHLORIDE 0.9% FLUSH
3.0000 mL | INTRAVENOUS | Status: DC | PRN
  Administered 2019-06-12: 3 mL via INTRAVENOUS

## 2019-06-12 MED ORDER — HEPARIN SODIUM (PORCINE) 5000 UNIT/ML IJ SOLN: 5000.0000 [IU] | Freq: Three times a day (TID) | INTRAMUSCULAR | Status: DC

## 2019-06-12 MED ORDER — HEPARIN BOLUS VIA INFUSION
4000.0000 [IU] | Freq: Once | INTRAVENOUS | Status: AC
  Administered 2019-06-12: 4000 [IU] via INTRAVENOUS
  Filled 2019-06-12: qty 4000

## 2019-06-12 MED ORDER — PANTOPRAZOLE SODIUM 40 MG PO TBEC
40.0000 mg | DELAYED_RELEASE_TABLET | Freq: Two times a day (BID) | ORAL | Status: DC
  Administered 2019-06-12 – 2019-06-14 (×4): 40 mg via ORAL
  Filled 2019-06-12 (×4): qty 1

## 2019-06-12 MED ORDER — ASPIRIN EC 325 MG PO TBEC: 325.0000 mg | DELAYED_RELEASE_TABLET | Freq: Every day | ORAL | Status: DC

## 2019-06-12 MED ORDER — SODIUM CHLORIDE 0.9 % WEIGHT BASED INFUSION
3.0000 mL/kg/h | INTRAVENOUS | Status: AC
  Administered 2019-06-13: 3 mL/kg/h via INTRAVENOUS

## 2019-06-12 MED ORDER — MORPHINE SULFATE (PF) 2 MG/ML IV SOLN: 1.0000 mg | INTRAVENOUS | Status: DC | PRN

## 2019-06-12 MED ORDER — OXYCODONE HCL 5 MG PO TABS
5.0000 mg | ORAL_TABLET | Freq: Every day | ORAL | Status: DC | PRN
  Administered 2019-06-13: 5 mg via ORAL

## 2019-06-12 MED ORDER — ONDANSETRON HCL 4 MG/2ML IJ SOLN: 4.0000 mg | Freq: Four times a day (QID) | INTRAMUSCULAR | Status: DC | PRN

## 2019-06-12 MED ORDER — HEPARIN (PORCINE) 25000 UT/250ML-% IV SOLN
1550.0000 [IU]/h | INTRAVENOUS | Status: DC
  Administered 2019-06-12: 1350 [IU]/h via INTRAVENOUS
  Administered 2019-06-13: 1550 [IU]/h via INTRAVENOUS
  Filled 2019-06-12 (×3): qty 250

## 2019-06-12 MED ORDER — ASPIRIN 81 MG PO CHEW
324.0000 mg | CHEWABLE_TABLET | Freq: Once | ORAL | Status: AC
  Administered 2019-06-12: 324 mg via ORAL
  Filled 2019-06-12: qty 4

## 2019-06-12 MED ORDER — NITROGLYCERIN 0.4 MG SL SUBL: 0.4000 mg | SUBLINGUAL_TABLET | SUBLINGUAL | Status: DC | PRN

## 2019-06-12 MED ORDER — SODIUM CHLORIDE 0.9 % WEIGHT BASED INFUSION: 1.0000 mL/kg/h | INTRAVENOUS | Status: DC

## 2019-06-12 MED ORDER — SODIUM CHLORIDE 0.9 % IV SOLN: 250.0000 mL | INTRAVENOUS | Status: DC | PRN

## 2019-06-12 MED ORDER — OMEGA-3-ACID ETHYL ESTERS 1 G PO CAPS
1.0000 g | ORAL_CAPSULE | Freq: Every day | ORAL | Status: DC
  Administered 2019-06-13 – 2019-06-14 (×2): 1 g via ORAL
  Filled 2019-06-12 (×2): qty 1

## 2019-06-12 MED ORDER — SODIUM CHLORIDE 0.9% FLUSH
3.0000 mL | Freq: Two times a day (BID) | INTRAVENOUS | Status: DC
  Administered 2019-06-13 – 2019-06-14 (×2): 3 mL via INTRAVENOUS

## 2019-06-12 MED ORDER — INSULIN ASPART 100 UNIT/ML ~~LOC~~ SOLN
0.0000 [IU] | Freq: Three times a day (TID) | SUBCUTANEOUS | Status: DC
  Administered 2019-06-12: 3 [IU] via SUBCUTANEOUS
  Administered 2019-06-13 – 2019-06-14 (×2): 5 [IU] via SUBCUTANEOUS
  Administered 2019-06-14: 7 [IU] via SUBCUTANEOUS
  Filled 2019-06-12 (×4): qty 1

## 2019-06-12 MED ORDER — EZETIMIBE 10 MG PO TABS
10.0000 mg | ORAL_TABLET | Freq: Every day | ORAL | Status: DC
  Administered 2019-06-13 – 2019-06-14 (×3): 10 mg via ORAL
  Filled 2019-06-12 (×3): qty 1

## 2019-06-12 MED ORDER — ACETAMINOPHEN 325 MG PO TABS: 650.0000 mg | ORAL_TABLET | ORAL | Status: DC | PRN

## 2019-06-12 MED ORDER — ASPIRIN 81 MG PO CHEW
81.0000 mg | CHEWABLE_TABLET | ORAL | Status: AC
  Administered 2019-06-13: 81 mg via ORAL
  Filled 2019-06-12: qty 1

## 2019-06-12 NOTE — ED Notes (Signed)
Md at bedside

## 2019-06-12 NOTE — ED Notes (Signed)
Date and time results received: 06/12/19  (use smartphrase ".now" to insert current time)  Test: Troponin Critical Value: 1013  Name of Provider Notified: Jacqualine Code

## 2019-06-12 NOTE — ED Notes (Signed)
This nurse talked to and updated his daughter, she states there are no further updates at this time.

## 2019-06-12 NOTE — ED Notes (Signed)
Date and time results received: 06/12/19   Test: Troponin Critical Value: 169  Name of Provider Notified: Quale   No new orders at this time.

## 2019-06-12 NOTE — Consult Note (Signed)
[facility] Consultation Note  Patient ID: Aaron [name]., MRN: [id number], DOB/AGE: 09/01/1945 73 y.o. Admit date: 06/12/2019   Date of Consult: 06/12/2019 Primary Physician: [name], [name] Primary Cardiologist: Nehemiah Massed  Chief Complaint:  Chief Complaint  Patient presents with  . Chest Pain   Reason for Consult: Chest pain with abnormal troponin  HPI: 73 y.o. male with known coronary artery disease hypertension hyperlipidemia diabetes and previous myocardial infarction for which the patient has been on appropriate medication management.  The patient has had a card catheterization in 2018 showing patent right coronary artery and left anterior descending artery stent with moderate atherosclerosis of distal right coronary artery and circumflex obtuse marginal artery.  The patient has had recent surgery on his shoulder for which she did fairly well but in the interim has had several episodes for which she has had severe substernal chest discomfort.  This chest discomfort usually comes with physical activity substernal in nature causing shortness of breath and weakness.  The patient arrived in the emergency room today with chest discomfort and received nitroglycerin.  This did not immediately take his chest pain away but slowly has resolved his chest pain.  EKG shows normal sinus rhythm with nonspecific ST changes but no evidence of myocardial infarction.  Troponin is 169 consistent with possible unstable angina.  We have discussed at length further treatment options including medication management and/or further stress test although recent stress test has shown a small reversible inferior myocardial perfusion defect consistent with previous distal right coronary artery stenosis in March of this year.  Therefore the patient may have need for further angiography for other assessment and change in treatment options  Past Medical History:  Diagnosis Date  . Cellulitis   .  Diabetes mellitus without complication ([place])   . MI, old       Surgical History:  Past Surgical History:  Procedure Laterality Date  . ABDOMINAL SURGERY    . APPENDECTOMY    . CARDIAC CATHETERIZATION Left 10/21/2015   Procedure: Left Heart Cath and Coronary Angiography;  Surgeon: Corey Skains, MD;  Location: [place] [facility];  Service: Cardiovascular;  Laterality: Left;  . CARDIAC CATHETERIZATION N/A 10/21/2015   Procedure: Coronary Stent Intervention;  Surgeon: Isaias Cowman, MD;  Location: [place] [facility];  Service: Cardiovascular;  Laterality: N/A;  . CHOLECYSTECTOMY    . CORONARY ANGIOPLASTY WITH STENT PLACEMENT    . LEFT HEART CATH AND CORONARY ANGIOGRAPHY Left 10/07/2016   Procedure: Left Heart Cath and Coronary Angiography;  Surgeon: Corey Skains, MD;  Location: [place] [facility];  Service: Cardiovascular;  Laterality: Left;  . SHOULDER ARTHROSCOPY WITH SUBACROMIAL DECOMPRESSION AND BICEP TENDON REPAIR Right 02/24/2018   Procedure: SHOULDER ARTHROSCOPY WITH SUBACROMIAL DEBRIDEMENT, DECOMPRESSION, ROTATOR CUFF REPAIR AND POSSIBLE BICEP TENODESIS;  Surgeon: Corky Mull, MD;  Location: [facility];  Service: Orthopedics;  Laterality: Right;  . SHOULDER ARTHROSCOPY WITH SUBACROMIAL DECOMPRESSION AND BICEP TENDON REPAIR Left 04/25/2019   Procedure: SHOULDER ARTHROSCOPY WITH DEBRIDEMENT, DECOMPRESSION AND BICEP TENDON REPAIR, ROTATOR CUFF REPAIR;  Surgeon: Corky Mull, MD;  Location: [facility];  Service: Orthopedics;  Laterality: Left;  . TONSILLECTOMY       Home Meds: Prior to Admission medications   Medication Sig Start Date End Date Taking? Authorizing Provider  ketoconazole (NIZORAL) 2 % shampoo Apply 1 application topically 2 (two) times a week. 02/03/18  Yes [provider]  metFORMIN (GLUCOPHAGE) 1000 MG tablet Take 1,000 mg by mouth  2 (two) times daily. 02/22/19  Yes [provider]  augmented betamethasone dipropionate (DIPROLENE-AF) 0.05 %  cream Apply 1 application topically daily as needed (ear irritation).    [provider]  nitroGLYCERIN (NITROSTAT) 0.4 MG SL tablet Take 0.4 mg by mouth every 5 (five) minutes as needed for chest pain.  03/29/14   [provider]  Omega-3 Fatty Acids (FISH OIL) 1200 MG CAPS Take 2,400 mg by mouth daily.     [provider]  oxyCODONE (ROXICODONE) 5 MG immediate release tablet Take 1-2 tablets (5-10 mg total) by mouth every 4 (four) hours as needed. 04/25/19   Poggi, Excell SeltzerJohn J, MD    Inpatient Medications:  . [START ON 06/13/2019] aspirin  324 mg Oral Daily  . sodium chloride flush  3 mL Intravenous Q12H   . sodium chloride      Allergies:  Allergies  Allergen Reactions  . Ace Inhibitors Other (See Comments)    weakness  . Isosorbide     weakness  . Metoprolol Tartrate Other (See Comments)    weakness  . Statins Nausea And Vomiting and Other (See Comments)    Patient states he gets real weak and headache.  . Sulfa Antibiotics Other (See Comments)    Unknown childhood reaction     Social History   Socioeconomic History  . Marital status: Widowed    Spouse name: Not on file  . Number of children: Not on file  . Years of education: Not on file  . Highest education level: Not on file  Occupational History  . Not on file  Tobacco Use  . Smoking status: Former Games developermoker  . Smokeless tobacco: Former Engineer, waterUser  Substance and Sexual Activity  . Alcohol use: No  . Drug use: No  . Sexual activity: Not on file  Other Topics Concern  . Not on file  Social History Narrative  . Not on file   Social Determinants of Health   Financial Resource Strain:   . Difficulty of Paying Living Expenses: Not on file  Food Insecurity:   . Worried About Programme researcher, broadcasting/film/videounning Out of Food in the Last Year: Not on file  . Ran Out of Food in the Last Year: Not on file  Transportation Needs:   . Lack of Transportation (Medical): Not on file  . Lack of Transportation (Non-Medical): Not on file   Physical Activity:   . Days of Exercise per Week: Not on file  . Minutes of Exercise per Session: Not on file  Stress:   . Feeling of Stress : Not on file  Social Connections:   . Frequency of Communication with Friends and Family: Not on file  . Frequency of Social Gatherings with Friends and Family: Not on file  . Attends Religious Services: Not on file  . Active Member of Clubs or Organizations: Not on file  . Attends BankerClub or Organization Meetings: Not on file  . Marital Status: Not on file  Intimate Partner Violence:   . Fear of Current or Ex-Partner: Not on file  . Emotionally Abused: Not on file  . Physically Abused: Not on file  . Sexually Abused: Not on file     Family History  Problem Relation Age of Onset  . Angina Mother      Review of Systems Positive for chest pain shortness of breath Negative for: General:  chills, fever, night sweats or weight changes.  Cardiovascular: PND orthopnea syncope dizziness  Dermatological skin lesions rashes Respiratory: Cough congestion Urologic:  Frequent urination urination at night and hematuria Abdominal: negative for nausea, vomiting, diarrhea, bright red blood per rectum, melena, or hematemesis Neurologic: negative for visual changes, and/or hearing changes  All other systems reviewed and are otherwise negative except as noted above.  Labs: No results for input(s): CKTOTAL, CKMB, TROPONINI in the last 72 hours. Lab Results  Component Value Date   WBC 7.4 06/12/2019   HGB 14.3 06/12/2019   HCT 42.5 06/12/2019   MCV 87.8 06/12/2019   PLT 221 06/12/2019    Recent Labs  Lab 06/12/19 1150  NA 134*  K 4.6  CL 99  CO2 25  BUN 18  CREATININE 1.23  CALCIUM 8.8*  GLUCOSE 351*   No results found for: CHOL, HDL, LDLCALC, TRIG No results found for: DDIMER  Radiology/Studies:  DG Chest 2 View  Result Date: 06/12/2019 CLINICAL DATA:  Chest pain EXAM: CHEST - 2 VIEW COMPARISON:  April 25, 2019 FINDINGS: Lungs are  clear. Heart size and pulmonary vascularity are normal. No adenopathy. No bone lesions. No pneumothorax. There is calcification in the left carotid artery. IMPRESSION: No edema or consolidation. Cardiac silhouette normal. There is left carotid artery calcification. Electronically Signed   By: Bretta Bang III M.D.   On: 06/12/2019 12:59    EKG: Normal sinus rhythm with nonspecific ST and T wave changes  Weights: Filed Weights   06/12/19 1137  Weight: 113.4 kg     Physical Exam: Blood pressure (!) 142/78, pulse 88, temperature 97.6 F (36.4 C), temperature source Oral, resp. rate 14, height 6\' 1"  (1.854 m), weight 113.4 kg, SpO2 98 %. Body mass index is 32.98 kg/m. General: Well developed, well nourished, in no acute distress. Head eyes ears nose throat: Normocephalic, atraumatic, sclera non-icteric, no xanthomas, nares are without discharge. No apparent thyromegaly and/or mass  Lungs: Normal respiratory effort.  no wheezes, no rales, no rhonchi.  Heart: RRR with normal S1 S2. no murmur gallop, no rub, PMI is normal size and placement, carotid upstroke normal without bruit, jugular venous pressure is normal Abdomen: Soft, non-tender, non-distended with normoactive bowel sounds. No hepatomegaly. No rebound/guarding. No obvious abdominal masses. Abdominal aorta is normal size without bruit Extremities: No edema. no cyanosis, no clubbing, no ulcers  Peripheral : 2+ bilateral upper extremity pulses, 2+ bilateral femoral pulses, 2+ bilateral dorsal pedal pulse Neuro: Alert and oriented. No facial asymmetry. No focal deficit. Moves all extremities spontaneously. Musculoskeletal: Normal muscle tone without kyphosis Psych:  Responds to questions appropriately with a normal affect.    Assessment: 73 year old male with hypertension hyperlipidemia diabetes coronary atherosclerosis previous myocardial infarction abnormal stress test earlier this year with recurrent episodes of substernal chest  discomfort with elevated troponin consistent with possible unstable angina and/or early non-ST elevation myocardial infarction now chest pain-free on appropriate medication  Plan: 1.  Admit to the hospital for further evaluation treatment options and treatment for the possibility unstable angina 2.  Further consideration heparin if elevated troponin consistent with non-ST elevation myocardial infarction 3.  Nitrates topically for further chest pain 4.  Procedure with cardiac catheterization to assess coronary anatomy and further treatment thereof is necessary.  Patient understands the risk and benefits of cardiac catheterization.  This includes the possibility of death stroke heart attack infection bleeding blood clot.  He is at low risk for conscious sedation  Signed, 59 M.D. [facility] Card[facility] 06/12/2019, 1:55 PM

## 2019-06-12 NOTE — ED Provider Notes (Addendum)
[facility] Emergency Department Provider Note   ____________________________________________   First MD Initiated Contact with Patient 06/12/19 1200     (approximate)  I have reviewed the triage vital signs and the nursing notes.   HISTORY  Chief Complaint Chest Pain    HPI Aaron [name]. is a 73 y.o. male known history of coronary disease diabetes  With a week ago he went to [facility] and was seen and released for evaluation of chest pain.  He is following up today he went to go see his cardiologist, as he was walking to the hospital he started belting pain under his mid chest that radiates slightly to his jaw.  He went to cardiology clinic, they performed an EKG gave him 2 nitros and referred him to the ER for further work-up  He reports the present time is about a 1 out of 10 discomfort in the chest very very mild.  It is better.  Is not having shortness of breath.  No leg swelling.  No trouble breathing.  No discomfort with taking a deep breath  No leg or arm swelling.  Recovering from shoulder surgery from a couple months ago  No nausea or vomiting.   Past Medical History:  Diagnosis Date  . Cellulitis   . Diabetes mellitus without complication ([place])   . MI, old     Patient Active Problem List   Diagnosis Date Noted  . Unstable angina ([place]) 10/21/2015  . CAD (coronary artery disease) 10/21/2015    Past Surgical History:  Procedure Laterality Date  . ABDOMINAL SURGERY    . APPENDECTOMY    . CARDIAC CATHETERIZATION Left 10/21/2015   Procedure: Left Heart Cath and Coronary Angiography;  Surgeon: Corey Skains, MD;  Location: [place] [facility];  Service: Cardiovascular;  Laterality: Left;  . CARDIAC CATHETERIZATION N/A 10/21/2015   Procedure: Coronary Stent Intervention;  Surgeon: Isaias Cowman, MD;  Location: [place] [facility];  Service: Cardiovascular;  Laterality: N/A;  . CHOLECYSTECTOMY    . CORONARY ANGIOPLASTY  WITH STENT PLACEMENT    . LEFT HEART CATH AND CORONARY ANGIOGRAPHY Left 10/07/2016   Procedure: Left Heart Cath and Coronary Angiography;  Surgeon: Corey Skains, MD;  Location: [place] [facility];  Service: Cardiovascular;  Laterality: Left;  . SHOULDER ARTHROSCOPY WITH SUBACROMIAL DECOMPRESSION AND BICEP TENDON REPAIR Right 02/24/2018   Procedure: SHOULDER ARTHROSCOPY WITH SUBACROMIAL DEBRIDEMENT, DECOMPRESSION, ROTATOR CUFF REPAIR AND POSSIBLE BICEP TENODESIS;  Surgeon: Corky Mull, MD;  Location: [facility];  Service: Orthopedics;  Laterality: Right;  . SHOULDER ARTHROSCOPY WITH SUBACROMIAL DECOMPRESSION AND BICEP TENDON REPAIR Left 04/25/2019   Procedure: SHOULDER ARTHROSCOPY WITH DEBRIDEMENT, DECOMPRESSION AND BICEP TENDON REPAIR, ROTATOR CUFF REPAIR;  Surgeon: Corky Mull, MD;  Location: [facility];  Service: Orthopedics;  Laterality: Left;  . TONSILLECTOMY      Prior to Admission medications   Medication Sig Start Date End Date Taking? Authorizing Provider  augmented betamethasone dipropionate (DIPROLENE-AF) 0.05 % cream Apply 1 application topically daily as needed (ear irritation).    [provider]  ketoconazole (NIZORAL) 2 % shampoo Apply 1 application topically 2 (two) times a week. 02/03/18   [provider]  metFORMIN (GLUCOPHAGE) 1000 MG tablet Take 1,000 mg by mouth 2 (two) times daily. 02/22/19   [provider]  nitroGLYCERIN (NITROSTAT) 0.4 MG SL tablet Take 0.4 mg by mouth every 5 (five) minutes as needed for chest pain.  03/29/14   [provider]  Omega-3 Fatty Acids (FISH OIL) 1200 MG CAPS Take 2,400 mg by mouth daily.     [provider]  oxyCODONE (ROXICODONE) 5 MG immediate release tablet Take 1-2 tablets (5-10 mg total) by mouth every 4 (four) hours as needed. 04/25/19   Poggi, Excell Seltzer, MD    Allergies Ace inhibitors, Isosorbide, Metoprolol tartrate, Statins, and Sulfa antibiotics  Family History  Problem Relation Age of  Onset  . Angina Mother     Social History Social History   Tobacco Use  . Smoking status: Former Games developer  . Smokeless tobacco: Former Engineer, water Use Topics  . Alcohol use: No  . Drug use: No    Review of Systems Constitutional: No fever/chills or recent illness, tells me tested negative for Covid before his surgery in November Eyes: No visual changes. ENT: No sore throat. Cardiovascular: See HPI Respiratory: Denies shortness of breath. Gastrointestinal: No abdominal pain.   Genitourinary: Negative for dysuria. Musculoskeletal: Negative for back pain. Skin: Negative for rash. Neurological: Negative for headaches, areas of focal weakness or numbness.    ____________________________________________   PHYSICAL EXAM:  VITAL SIGNS: ED Triage Vitals  Enc Vitals Group     BP 06/12/19 1141 119/79     Pulse Rate 06/12/19 1141 90     Resp 06/12/19 1141 16     Temp 06/12/19 1141 97.6 F (36.4 C)     Temp Source 06/12/19 1141 Oral     SpO2 06/12/19 1141 96 %     Weight 06/12/19 1137 250 lb (113.4 kg)     Height 06/12/19 1137 6\' 1"  (1.854 m)     Head Circumference --      Peak Flow --      Pain Score 06/12/19 1137 4     Pain Loc --      Pain Edu? --      Excl. in GC? --     Constitutional: Alert and oriented. Well appearing and in no acute distress. Eyes: Conjunctivae are normal. Head: Atraumatic. Nose: No congestion/rhinnorhea. Mouth/Throat: Mucous membranes are moist. Neck: No stridor.  Cardiovascular: Normal rate, regular rhythm. Grossly normal heart sounds.  Good peripheral circulation. Respiratory: Normal respiratory effort.  No retractions. Lungs CTAB. Gastrointestinal: Soft and nontender. No distention. Musculoskeletal: No lower extremity tenderness nor edema.  Left upper extremity left shoulder clean dry intact incisions are healing well.  Denies left arm pain or swelling.  No lower leg swelling or edema bilateral Neurologic:  Normal speech and language.  No gross focal neurologic deficits are appreciated.  Skin:  Skin is warm, dry and intact. No rash noted. Psychiatric: Mood and affect are normal. Speech and behavior are normal.  ____________________________________________   LABS (all labs ordered are listed, but only abnormal results are displayed)  Labs Reviewed  BASIC METABOLIC PANEL - Abnormal; Notable for the following components:      Result Value   Sodium 134 (*)    Glucose, Bld 351 (*)    Calcium 8.8 (*)    GFR calc non Af Amer 57 (*)    All other components within normal limits  TROPONIN I (HIGH SENSITIVITY) - Abnormal; Notable for the following components:   Troponin I (High Sensitivity) 169 (*)    All other components within normal limits  SARS CORONAVIRUS 2 (TAT 6-24 HRS)  CBC   ____________________________________________  EKG  Reviewed enterotomy at 1145 Heart rate 90 QRS 110 QTc 440 Normal sinus rhythm, no evidence of acute ischemia or ectopy.  Possible old anterior infarct.  Compared with his EKG from May 15, 2019, change in R wave progression. ____________________________________________  RADIOLOGY  DG Chest 2 View  Result Date: 06/12/2019 CLINICAL DATA:  Chest pain EXAM: CHEST - 2 VIEW COMPARISON:  April 25, 2019 FINDINGS: Lungs are clear. Heart size and pulmonary vascularity are normal. No adenopathy. No bone lesions. No pneumothorax. There is calcification in the left carotid artery. IMPRESSION: No edema or consolidation. Cardiac silhouette normal. There is left carotid artery calcification. Electronically Signed   By: Bretta BangWilliam  Woodruff III M.D.   On: 06/12/2019 12:59    Imaging reviewed negative for acute ____________________________________________   PROCEDURES  Procedure(s) performed: None  Procedures  Critical Care performed: Yes, see critical care note(s)  CRITICAL CARE Performed by: Sharyn CreamerMark Jentry Mcqueary   Total critical care time: 25 minutes  Critical care time was exclusive of  separately billable procedures and treating other patients.  Critical care was necessary to treat or prevent imminent or life-threatening deterioration.  Critical care was time spent personally by me on the following activities: development of treatment plan with patient and/or surrogate as well as nursing, discussions with consultants, evaluation of patient's response to treatment, examination of patient, obtaining history from patient or surrogate, ordering and performing treatments and interventions, ordering and review of laboratory studies, ordering and review of radiographic studies, pulse oximetry and re-evaluation of patient's condition.  Patient's repeat troponin is become critically elevated.  This was shared with Dr. Gwen PoundsKowalski, decision made to start patient on heparin.  Hospitalist Dr. Clyde LundborgNiu has ordered heparin infusion for high concern for acute coronary syndrome in this case including NSTEMI  ____________________________________________   INITIAL IMPRESSION / ASSESSMENT AND PLAN / ED COURSE  Pertinent labs & imaging results that were available during my care of the patient were reviewed by me and considered in my medical decision making (see chart for details).   Differential diagnosis includes, but is not limited to, ACS, aortic dissection, pulmonary embolism, cardiac tamponade, pneumothorax, pneumonia, pericarditis, myocarditis, GI-related causes including esophagitis/gastritis, and musculoskeletal chest wall pain.     Clinical Course as of Jun 12 1331  Mon Jun 12, 2019  1231 Received 2 nitro tabs by office visit today. Currently reports very mild (1/10) discomfort under mid-sternum.    [MQ]  1258 Dr. Gwen PoundsKowalski, patient's cardiologist currently in room seeing patient in the ER   [MQ]  1301 Patient reports now chest pain and symptom free.   [MQ]    Clinical Course User Index [MQ] Sharyn CreamerQuale, Cassadie Pankonin, MD    ----------------------------------------- 1:32 PM on  06/12/2019 -----------------------------------------  Discussed with Dr. Gwen PoundsKowalski, will admit the patient for further work-up not his troponin resulted positive.  He does remain pain-free.  Patient comfortable with plan for admission agreeable with plan and cardiology will follow. ____________________________________________   FINAL CLINICAL IMPRESSION(S) / ED DIAGNOSES  Final diagnoses:  Chest pain with high risk of acute coronary syndrome  unstable angina      Note:  This document was prepared using Dragon voice recognition software and may include unintentional dictation errors       Sharyn CreamerQuale, Nissi Doffing, MD 06/12/19 1332    Sharyn CreamerQuale, Qusay Villada, MD 06/12/19 1545

## 2019-06-12 NOTE — ED Triage Notes (Addendum)
Pt here from [place] clinic because of chest pain. Pt reports having chest pain for a week and a half, pt went to [facility] today for physical therapy. Pt states he had recent shoulder surgery, sling is in place. Pt started to have chest pain and was brought over to the ER by [facility]. Jefm Bryant gave pt 3x sprays of nitro and pt reports taking x2 nitro sublingual in the waiting room.  Pt reports history of the same and requiring stent placement.

## 2019-06-12 NOTE — Consult Note (Signed)
ANTICOAGULATION CONSULT NOTE - Initial Consult  Pharmacy Consult for Heparin Infusion Indication: chest pain/ACS  Allergies  Allergen Reactions  . Ace Inhibitors Other (See Comments)    weakness  . Isosorbide     weakness  . Metoprolol Tartrate Other (See Comments)    weakness  . Statins Nausea And Vomiting and Other (See Comments)    Patient states he gets real weak and headache.  . Sulfa Antibiotics Other (See Comments)    Unknown childhood reaction     Patient Measurements: Height: 6\' 1"  (185.4 cm) Weight: 250 lb (113.4 kg) IBW/kg (Calculated) : 79.9 Heparin Dosing Weight: 103.9 kg  Vital Signs: Temp: 97.6 F (36.4 C) (12/21 1141) Temp Source: Oral (12/21 1141) BP: 110/62 (12/21 1400) Pulse Rate: 89 (12/21 1415)  Labs: Recent Labs    06/12/19 1150 06/12/19 1408  HGB 14.3  --   HCT 42.5  --   PLT 221  --   CREATININE 1.23  --   TROPONINIHS 169* 1,013*    Estimated Creatinine Clearance: 68.5 mL/min (by C-G formula based on SCr of 1.23 mg/dL).   Medical History: Past Medical History:  Diagnosis Date  . Cellulitis   . Diabetes mellitus without complication ([place])   . MI, old     Medications:  (Not in a hospital admission)  Scheduled:  . [START ON 06/13/2019] aspirin EC  325 mg Oral Daily  . heparin  4,000 Units Intravenous Once  . insulin aspart  0-5 Units Subcutaneous QHS  . insulin aspart  0-9 Units Subcutaneous TID WC  . sodium chloride flush  3 mL Intravenous Q12H   Infusions:  . sodium chloride 75 mL/hr at 06/12/19 1406  . heparin     PRN: morphine injection, nitroGLYCERIN Anti-infectives (From admission, onward)   None      Assessment: Pharmacy has been consulted for Heparin Infusion on 73yo patient chest pain. Has had a cardiac catheterization in 2018 showing right coronary artery and left anterior descending artery stent with moderate atherosclerosis of distal right coronary artery and circumflex obtuse marginal artery. Patient has had  recent shoulder surgery for which she did fairly well but in the interim has had several episodes of substernal chest discomfort.  Troponin level of 169>1,013. Baseline labs have been ordered and are pending.   Goal of Therapy:  Heparin level 0.3-0.7 units/ml Monitor platelets by anticoagulation protocol: Yes   Plan:  Give 4000 units bolus x 1 Start heparin infusion at 1350 units/hr Check anti-Xa level in 8 hours and daily while on heparin Continue to monitor H&H and platelets  Khaleb Broz A Billiejo Sorto 06/12/2019,3:24 PM

## 2019-06-12 NOTE — H&P (Addendum)
History and Physical    [employer]. WCH:[id number] DOB: Aug 23, 1945 DOA: 06/12/2019  Referring MD/NP/PA:   PCP: [name], [name]   Patient coming from:  The patient is coming from home.  At baseline, pt is independent for most of ADL.        Chief Complaint: chest pain  HPI: Aaron [name]. is a 73 y.o. male with medical history significant of diabetes mellitus, HLD (allergic to statin), CAD, stent placement, CKD-3, who presents with chest pain.  Pt states that he had chest pain a week ago. He went to [facility] and was seen and released for evaluation of chest pain. He states that he still has intermittent chest pain, which is located substernal area, today 8 out of 10 in severity earlier, currently subsided.  No shortness of breath, cough, fever or chills. He went to cardiology clinic today, they performed an EKG gave him nitros and referred him to the ER for further work-up.  Patient states that he does not have nausea, vomiting, diarrhea, abdominal pain, symptoms of UTI or unilateral weakness.  He mentioned that he noted dark-colored stool in the past several days, he is not sure if it is blood or not.  ED Course: pt was found to have trop 169 -->1013, WBC 7.4, hemoglobin 14.3, INR 1.0, PTT 30, pending COVID-19 test, stable renal function, temperature normal, blood pressure 151/83, heart rate 91, RR 18, oxygen saturation 96% on room air.  Chest x-ray negative.  Patient is placed on telemetry bed of observation.  Cardiology, Dr. Nehemiah Massed was consulted.  Review of Systems:   General: no fevers, chills, no body weight gain, has fatigue HEENT: no blurry vision, hearing changes or sore throat Respiratory: no dyspnea, coughing, wheezing CV: has chest pain, no palpitations GI: no nausea, vomiting, abdominal pain, diarrhea, constipation GU: no dysuria, burning on urination, increased urinary frequency, hematuria. Has dark stool Ext: no leg edema Neuro: no unilateral  weakness, numbness, or tingling, no vision change or hearing loss Skin: no rash, no skin tear. MSK: No muscle spasm, no deformity, no limitation of range of movement in spin Heme: No easy bruising.  Travel history: No recent long distant travel.  Allergy:  Allergies  Allergen Reactions  . Ace Inhibitors Other (See Comments)    weakness  . Isosorbide     weakness  . Metoprolol Tartrate Other (See Comments)    weakness  . Statins Nausea And Vomiting and Other (See Comments)    Patient states he gets real weak and headache.  . Sulfa Antibiotics Other (See Comments)    Unknown childhood reaction     Past Medical History:  Diagnosis Date  . Cellulitis   . Diabetes mellitus without complication ([place])   . MI, old     Past Surgical History:  Procedure Laterality Date  . ABDOMINAL SURGERY    . APPENDECTOMY    . CARDIAC CATHETERIZATION Left 10/21/2015   Procedure: Left Heart Cath and Coronary Angiography;  Surgeon: Corey Skains, MD;  Location: [place] [facility];  Service: Cardiovascular;  Laterality: Left;  . CARDIAC CATHETERIZATION N/A 10/21/2015   Procedure: Coronary Stent Intervention;  Surgeon: Isaias Cowman, MD;  Location: [place] [facility];  Service: Cardiovascular;  Laterality: N/A;  . CHOLECYSTECTOMY    . CORONARY ANGIOPLASTY WITH STENT PLACEMENT    . LEFT HEART CATH AND CORONARY ANGIOGRAPHY Left 10/07/2016   Procedure: Left Heart Cath and Coronary Angiography;  Surgeon: Corey Skains, MD;  Location:  [facility];  Service: Cardiovascular;  Laterality: Left;  . SHOULDER ARTHROSCOPY WITH SUBACROMIAL DECOMPRESSION AND BICEP TENDON REPAIR Right 02/24/2018   Procedure: SHOULDER ARTHROSCOPY WITH SUBACROMIAL DEBRIDEMENT, DECOMPRESSION, ROTATOR CUFF REPAIR AND POSSIBLE BICEP TENODESIS;  Surgeon: Christena Flake, MD;  Location: [facility];  Service: Orthopedics;  Laterality: Right;  . SHOULDER ARTHROSCOPY WITH SUBACROMIAL DECOMPRESSION AND BICEP TENDON REPAIR Left  04/25/2019   Procedure: SHOULDER ARTHROSCOPY WITH DEBRIDEMENT, DECOMPRESSION AND BICEP TENDON REPAIR, ROTATOR CUFF REPAIR;  Surgeon: Christena Flake, MD;  Location: [facility];  Service: Orthopedics;  Laterality: Left;  . TONSILLECTOMY      Social History:  reports that he has quit smoking. He has quit using smokeless tobacco. He reports that he does not drink alcohol or use drugs.  Family History:  Family History  Problem Relation Age of Onset  . Angina Mother      Prior to Admission medications   Medication Sig Start Date End Date Taking? Authorizing Provider  augmented betamethasone dipropionate (DIPROLENE-AF) 0.05 % cream Apply 1 application topically daily as needed (ear irritation).    [provider]  ketoconazole (NIZORAL) 2 % shampoo Apply 1 application topically 2 (two) times a week. 02/03/18   [provider]  metFORMIN (GLUCOPHAGE) 1000 MG tablet Take 1,000 mg by mouth 2 (two) times daily. 02/22/19   [provider]  nitroGLYCERIN (NITROSTAT) 0.4 MG SL tablet Take 0.4 mg by mouth every 5 (five) minutes as needed for chest pain.  03/29/14   [provider]  Omega-3 Fatty Acids (FISH OIL) 1200 MG CAPS Take 2,400 mg by mouth daily.     [provider]  oxyCODONE (ROXICODONE) 5 MG immediate release tablet Take 1-2 tablets (5-10 mg total) by mouth every 4 (four) hours as needed. 04/25/19   Poggi, Excell Seltzer, MD    Physical Exam: Vitals:   06/12/19 1600 06/12/19 1615 06/12/19 1630 06/12/19 1700  BP: 138/81  135/71 127/68  Pulse: 83 81 80 76  Resp: Temp:      TempSrc:      SpO2: 97% 98% 99% 97%  Weight:      Height:       General: Not in acute distress HEENT:       Eyes: PERRL, EOMI, no scleral icterus.       ENT: No discharge from the ears and nose, no pharynx injection, no tonsillar enlargement.        Neck: No JVD, no bruit, no mass felt. Heme: No neck lymph node enlargement. Cardiac: S1/S2, RRR, No murmurs, No gallops or  rubs. Respiratory: No rales, wheezing, rhonchi or rubs. GI: Soft, nondistended, nontender, no rebound pain, no organomegaly, BS present. GU: No hematuria Ext: No pitting leg edema bilaterally. 2+DP/PT pulse bilaterally. Musculoskeletal: No joint deformities, No joint redness or warmth, no limitation of ROM in spin. Skin: No rashes.  Neuro: Alert, oriented X3, cranial nerves II-XII grossly intact, moves all extremities normally. Psych: Patient is not psychotic, no suicidal or hemocidal ideation.  Labs on Admission: I have personally reviewed following labs and imaging studies  CBC: Recent Labs  Lab 06/12/19 1150  WBC 7.4  HGB 14.3  HCT 42.5  MCV 87.8  PLT 221   Basic Metabolic Panel: Recent Labs  Lab 06/12/19 1150  NA 134*  K 4.6  CL 99  CO2 25  GLUCOSE 351*  BUN 18  CREATININE 1.23  CALCIUM 8.8*   GFR: Estimated Creatinine Clearance: 68.5 mL/min (  by C-G formula based on SCr of 1.23 mg/dL). Liver Function Tests: No results for input(s): AST, ALT, ALKPHOS, BILITOT, PROT, ALBUMIN in the last 168 hours. No results for input(s): LIPASE, AMYLASE in the last 168 hours. No results for input(s): AMMONIA in the last 168 hours. Coagulation Profile: Recent Labs  Lab 06/12/19 1550  INR 1.0   Cardiac Enzymes: No results for input(s): CKTOTAL, CKMB, CKMBINDEX, TROPONINI in the last 168 hours. BNP (last 3 results) No results for input(s): PROBNP in the last 8760 hours. HbA1C: No results for input(s): HGBA1C in the last 72 hours. CBG: Recent Labs  Lab 06/12/19 1813  GLUCAP 237*   Lipid Profile: No results for input(s): CHOL, HDL, LDLCALC, TRIG, CHOLHDL, LDLDIRECT in the last 72 hours. Thyroid Function Tests: No results for input(s): TSH, T4TOTAL, FREET4, T3FREE, THYROIDAB in the last 72 hours. Anemia Panel: No results for input(s): VITAMINB12, FOLATE, FERRITIN, TIBC, IRON, RETICCTPCT in the last 72 hours. Urine analysis:    Component Value Date/Time   COLORURINE  STRAW (A) 02/24/2015 1340   APPEARANCEUR CLEAR (A) 02/24/2015 1340   APPEARANCEUR CLEAR 09/12/2014 0842   LABSPEC 1.011 02/24/2015 1340   LABSPEC 1.019 09/12/2014 0842   PHURINE 5.0 02/24/2015 1340   GLUCOSEU NEGATIVE 02/24/2015 1340   GLUCOSEU NEGATIVE 09/12/2014 0842   HGBUR NEGATIVE 02/24/2015 1340   BILIRUBINUR NEGATIVE 02/24/2015 1340   BILIRUBINUR NEGATIVE 09/12/2014 0842   KETONESUR NEGATIVE 02/24/2015 1340   PROTEINUR NEGATIVE 02/24/2015 1340   NITRITE NEGATIVE 02/24/2015 1340   LEUKOCYTESUR NEGATIVE 02/24/2015 1340   LEUKOCYTESUR NEGATIVE 09/12/2014 0842   Sepsis Labs: @LABRCNTIP (procalcitonin:4,lacticidven:4) )No results found for this or any previous visit (from the past 240 hour(s)).   Radiological Exams on Admission: DG Chest 2 View  Result Date: 06/12/2019 CLINICAL DATA:  Chest pain EXAM: CHEST - 2 VIEW COMPARISON:  April 25, 2019 FINDINGS: Lungs are clear. Heart size and pulmonary vascularity are normal. No adenopathy. No bone lesions. No pneumothorax. There is calcification in the left carotid artery. IMPRESSION: No edema or consolidation. Cardiac silhouette normal. There is left carotid artery calcification. Electronically Signed   By: April 27, 2019 III M.D.   On: 06/12/2019 12:59     EKG: Independently reviewed.  Sinus rhythm, QTC 436, LAD, poor R wave progression, ST depression in lateral leads.  Assessment/Plan Principal Problem:   Chest pain Active Problems:   CAD (coronary artery disease)   CKD (chronic kidney disease), stage IIIa   NSTEMI (non-ST elevated myocardial infarction) (HCC)   Dark stools   Type II diabetes mellitus with renal manifestations (HCC)   Chest pain, NSTEMI and hx of CAD: Trop 169 --> 1013.  Cardiology was consulted.  Per Dr. 06/14/2019, "The patient has had a card catheterization in 2018 showing patent right coronary artery and left anterior descending artery stent with moderate atherosclerosis of distal right coronary artery  and circumflex obtuse marginal artery". Planing to do cardic cath.  -will place on tele bed for obs - IV heparin - Trend Trop - Repeat EKG in the am  - prn Nitroglycerin, Morphine, and aspirin - zetia - Risk factor stratification: will check FLP and A1C  - check UDS  Type II diabetes mellitus with renal manifestations: Last A1c 6.1, well controled. Patient is taking Metformin at home -SSI  CKD (chronic kidney disease), stage IIIa: stable -f/u by BMP  Dark stool: Unclear etiology, not sure if this is GI bleeding or not.  His hemoglobin ia stable, 14.3 (14.2 on 04/18/2019) -check FOBT -  start protonix orally 40 mg empirically -check CBC q6h since pt is on IV heparin -inr/PTT/type screen   DVT ppx: on IV Heparin  Code Status: Full code Family Communication: Yes, patient's    at bed side Disposition Plan:  Anticipate discharge back to previous home environment Consults called:  card Admission status: Med-surg bed for obs         Date of Service 06/12/2019    Lorretta HarpXilin Cathren Sween [facility]   If 7PM-7AM, please contact night-coverage [email or web address] Password [email or web address] 06/12/2019, 6:58 PM

## 2019-06-12 NOTE — ED Notes (Signed)
MD notified of patient's continued rise in troponin. No new orders at this time.

## 2019-06-13 ENCOUNTER — Encounter: Payer: Self-pay | Admitting: Internal Medicine

## 2019-06-13 ENCOUNTER — Encounter
Admission: EM | Disposition: A | Discharge status: Home / Self Care | Payer: Self-pay | Source: Home / Self Care | Attending: Internal Medicine

## 2019-06-13 DIAGNOSIS — E1122 Type 2 diabetes mellitus with diabetic chronic kidney disease: Secondary | ICD-10-CM | POA: Diagnosis present

## 2019-06-13 DIAGNOSIS — I214 Non-ST elevation (NSTEMI) myocardial infarction: Secondary | ICD-10-CM | POA: Diagnosis present

## 2019-06-13 DIAGNOSIS — I25110 Atherosclerotic heart disease of native coronary artery with unstable angina pectoris: Secondary | ICD-10-CM | POA: Diagnosis present

## 2019-06-13 DIAGNOSIS — R079 Chest pain, unspecified: Secondary | ICD-10-CM | POA: Diagnosis present

## 2019-06-13 DIAGNOSIS — E1129 Type 2 diabetes mellitus with other diabetic kidney complication: Secondary | ICD-10-CM

## 2019-06-13 DIAGNOSIS — Z87891 Personal history of nicotine dependence: Secondary | ICD-10-CM | POA: Diagnosis not present

## 2019-06-13 DIAGNOSIS — Z882 Allergy status to sulfonamides status: Secondary | ICD-10-CM | POA: Diagnosis not present

## 2019-06-13 DIAGNOSIS — Z7984 Long term (current) use of oral hypoglycemic drugs: Secondary | ICD-10-CM | POA: Diagnosis not present

## 2019-06-13 DIAGNOSIS — E785 Hyperlipidemia, unspecified: Secondary | ICD-10-CM | POA: Diagnosis present

## 2019-06-13 DIAGNOSIS — Z888 Allergy status to other drugs, medicaments and biological substances status: Secondary | ICD-10-CM | POA: Diagnosis not present

## 2019-06-13 DIAGNOSIS — R195 Other fecal abnormalities: Secondary | ICD-10-CM | POA: Diagnosis not present

## 2019-06-13 DIAGNOSIS — Z20828 Contact with and (suspected) exposure to other viral communicable diseases: Secondary | ICD-10-CM | POA: Diagnosis present

## 2019-06-13 DIAGNOSIS — I252 Old myocardial infarction: Secondary | ICD-10-CM | POA: Diagnosis not present

## 2019-06-13 DIAGNOSIS — N1831 Chronic kidney disease, stage 3a: Secondary | ICD-10-CM | POA: Diagnosis not present

## 2019-06-13 DIAGNOSIS — Z955 Presence of coronary angioplasty implant and graft: Secondary | ICD-10-CM | POA: Diagnosis not present

## 2019-06-13 HISTORY — PX: CORONARY STENT INTERVENTION: CATH118234

## 2019-06-13 HISTORY — PX: LEFT HEART CATH AND CORONARY ANGIOGRAPHY: CATH118249

## 2019-06-13 LAB — CBC
HCT: 40.1 % (ref 39.0–52.0)
HCT: 38.8 % — ABNORMAL LOW (ref 39.0–52.0)
HCT: 37.5 % — ABNORMAL LOW (ref 39.0–52.0)
Hemoglobin: 13.5 g/dL (ref 13.0–17.0)
Hemoglobin: 13.1 g/dL (ref 13.0–17.0)
Hemoglobin: 12.8 g/dL — ABNORMAL LOW (ref 13.0–17.0)
MCH: 29.9 pg (ref 26.0–34.0)
MCH: 29.6 pg (ref 26.0–34.0)
MCH: 30 pg (ref 26.0–34.0)
MCHC: 33.7 g/dL (ref 30.0–36.0)
MCHC: 33.8 g/dL (ref 30.0–36.0)
MCHC: 34.1 g/dL (ref 30.0–36.0)
MCV: 88.9 fL (ref 80.0–100.0)
MCV: 87.8 fL (ref 80.0–100.0)
MCV: 88 fL (ref 80.0–100.0)
Platelets: 211 10*3/uL (ref 150–400)
Platelets: 172 10*3/uL (ref 150–400)
Platelets: 170 10*3/uL (ref 150–400)
RBC: 4.51 MIL/uL (ref 4.22–5.81)
RBC: 4.42 MIL/uL (ref 4.22–5.81)
RBC: 4.26 MIL/uL (ref 4.22–5.81)
RDW: 15.6 % — ABNORMAL HIGH (ref 11.5–15.5)
RDW: 15.6 % — ABNORMAL HIGH (ref 11.5–15.5)
RDW: 15.5 % (ref 11.5–15.5)
WBC: 7.6 10*3/uL (ref 4.0–10.5)
WBC: 5.5 10*3/uL (ref 4.0–10.5)
WBC: 6.4 10*3/uL (ref 4.0–10.5)
nRBC: 0 % (ref 0.0–0.2)
nRBC: 0 % (ref 0.0–0.2)
nRBC: 0 % (ref 0.0–0.2)

## 2019-06-13 LAB — URINE DRUG SCREEN, QUALITATIVE (ARMC ONLY)
Amphetamines, Ur Screen: NOT DETECTED
Barbiturates, Ur Screen: NOT DETECTED
Benzodiazepine, Ur Scrn: NOT DETECTED
Cannabinoid 50 Ng, Ur ~~LOC~~: NOT DETECTED
Cocaine Metabolite,Ur ~~LOC~~: NOT DETECTED
MDMA (Ecstasy)Ur Screen: NOT DETECTED
Methadone Scn, Ur: NOT DETECTED
Opiate, Ur Screen: NOT DETECTED
Phencyclidine (PCP) Ur S: NOT DETECTED
Tricyclic, Ur Screen: NOT DETECTED

## 2019-06-13 LAB — LDL CHOLESTEROL, DIRECT: Direct LDL: 65.4 mg/dL (ref 0–99)

## 2019-06-13 LAB — POCT ACTIVATED CLOTTING TIME: Activated Clotting Time: 395 seconds

## 2019-06-13 LAB — LIPID PANEL
Cholesterol: 148 mg/dL (ref 0–200)
HDL: 25 mg/dL — ABNORMAL LOW (ref >40)
LDL Cholesterol: UNDETERMINED mg/dL (ref 0–99)
Total CHOL/HDL Ratio: 5.9 RATIO
Triglycerides: 505 mg/dL — ABNORMAL HIGH (ref <150)
VLDL: UNDETERMINED mg/dL (ref 0–40)

## 2019-06-13 LAB — GLUCOSE, CAPILLARY
Glucose-Capillary: 247 mg/dL — ABNORMAL HIGH (ref 70–99)
Glucose-Capillary: 257 mg/dL — ABNORMAL HIGH (ref 70–99)
Glucose-Capillary: 309 mg/dL — ABNORMAL HIGH (ref 70–99)

## 2019-06-13 LAB — HEMOGLOBIN A1C
Hgb A1c MFr Bld: 9.6 % — ABNORMAL HIGH (ref 4.8–5.6)
Mean Plasma Glucose: 228.82 mg/dL

## 2019-06-13 LAB — TROPONIN I (HIGH SENSITIVITY): Troponin I (High Sensitivity): 8718 ng/L (ref <18)

## 2019-06-13 SURGERY — LEFT HEART CATH AND CORONARY ANGIOGRAPHY
Anesthesia: Moderate Sedation

## 2019-06-13 MED ORDER — ASPIRIN 81 MG PO CHEW
81.0000 mg | CHEWABLE_TABLET | Freq: Every day | ORAL | Status: DC
  Administered 2019-06-14: 81 mg via ORAL
  Filled 2019-06-13: qty 1

## 2019-06-13 MED ORDER — PRASUGREL HCL 10 MG PO TABS
10.0000 mg | ORAL_TABLET | Freq: Every day | ORAL | Status: DC
  Administered 2019-06-14: 10 mg via ORAL
  Filled 2019-06-13: qty 1

## 2019-06-13 MED ORDER — ASPIRIN 81 MG PO CHEW
CHEWABLE_TABLET | ORAL | Status: DC | PRN
  Administered 2019-06-13: 243 mg via ORAL

## 2019-06-13 MED ORDER — BIVALIRUDIN BOLUS VIA INFUSION - CUPID
INTRAVENOUS | Status: DC | PRN
  Administered 2019-06-13: 80.175 mg via INTRAVENOUS

## 2019-06-13 MED ORDER — IOHEXOL 300 MG/ML  SOLN
INTRAMUSCULAR | Status: DC | PRN
  Administered 2019-06-13: 150 mL via INTRA_ARTERIAL

## 2019-06-13 MED ORDER — HYDRALAZINE HCL 20 MG/ML IJ SOLN: 10.0000 mg | INTRAMUSCULAR | Status: AC | PRN

## 2019-06-13 MED ORDER — IOHEXOL 300 MG/ML  SOLN
INTRAMUSCULAR | Status: DC | PRN
  Administered 2019-06-13: 115 mL

## 2019-06-13 MED ORDER — NITROGLYCERIN 0.4 MG SL SUBL
SUBLINGUAL_TABLET | SUBLINGUAL | Status: AC
  Filled 2019-06-13: qty 1

## 2019-06-13 MED ORDER — HEPARIN (PORCINE) IN NACL 1000-0.9 UT/500ML-% IV SOLN
INTRAVENOUS | Status: AC
  Filled 2019-06-13: qty 1000

## 2019-06-13 MED ORDER — NITROGLYCERIN 1 MG/10 ML FOR IR/CATH LAB
INTRA_ARTERIAL | Status: DC | PRN
  Administered 2019-06-13 (×2): 200 ug via INTRACORONARY

## 2019-06-13 MED ORDER — HEPARIN BOLUS VIA INFUSION
2000.0000 [IU] | INTRAVENOUS | Status: AC
  Administered 2019-06-13: 2000 [IU] via INTRAVENOUS
  Filled 2019-06-13: qty 2000

## 2019-06-13 MED ORDER — HEPARIN (PORCINE) IN NACL 1000-0.9 UT/500ML-% IV SOLN
INTRAVENOUS | Status: DC | PRN
  Administered 2019-06-13: 1000 mL

## 2019-06-13 MED ORDER — PRASUGREL HCL 10 MG PO TABS
ORAL_TABLET | ORAL | Status: AC
  Filled 2019-06-13: qty 6

## 2019-06-13 MED ORDER — SODIUM CHLORIDE 0.9% FLUSH
3.0000 mL | Freq: Two times a day (BID) | INTRAVENOUS | Status: DC
  Administered 2019-06-14: 3 mL via INTRAVENOUS

## 2019-06-13 MED ORDER — NITROGLYCERIN 0.4 MG SL SUBL
SUBLINGUAL_TABLET | SUBLINGUAL | Status: DC | PRN
  Administered 2019-06-13 (×2): .4 mg via SUBLINGUAL

## 2019-06-13 MED ORDER — OXYCODONE HCL 5 MG PO TABS
ORAL_TABLET | ORAL | Status: AC
  Filled 2019-06-13: qty 1

## 2019-06-13 MED ORDER — MIDAZOLAM HCL 2 MG/2ML IJ SOLN
INTRAMUSCULAR | Status: DC | PRN
  Administered 2019-06-13: 1 mg via INTRAVENOUS

## 2019-06-13 MED ORDER — FENTANYL CITRATE (PF) 100 MCG/2ML IJ SOLN
INTRAMUSCULAR | Status: DC | PRN
  Administered 2019-06-13: 50 ug via INTRAVENOUS

## 2019-06-13 MED ORDER — BIVALIRUDIN TRIFLUOROACETATE 250 MG IV SOLR
INTRAVENOUS | Status: AC
  Filled 2019-06-13: qty 250

## 2019-06-13 MED ORDER — ASPIRIN 81 MG PO CHEW
CHEWABLE_TABLET | ORAL | Status: AC
  Filled 2019-06-13: qty 3

## 2019-06-13 MED ORDER — ASPIRIN 81 MG PO CHEW: 81.0000 mg | CHEWABLE_TABLET | Freq: Every day | ORAL | Status: DC

## 2019-06-13 MED ORDER — PRASUGREL HCL 10 MG PO TABS
ORAL_TABLET | ORAL | Status: DC | PRN
  Administered 2019-06-13: 60 mg via ORAL

## 2019-06-13 MED ORDER — OXYCODONE HCL 5 MG PO TABS
5.0000 mg | ORAL_TABLET | ORAL | Status: DC | PRN
  Administered 2019-06-13 (×2): 5 mg via ORAL
  Administered 2019-06-14 (×3): 10 mg via ORAL
  Filled 2019-06-13 (×2): qty 2
  Filled 2019-06-13: qty 1
  Filled 2019-06-13 (×2): qty 2

## 2019-06-13 MED ORDER — PRASUGREL HCL 10 MG PO TABS
ORAL_TABLET | ORAL | Status: AC
  Filled 2019-06-13: qty 1

## 2019-06-13 MED ORDER — SODIUM CHLORIDE 0.9 % WEIGHT BASED INFUSION: 1.0000 mL/kg/h | INTRAVENOUS | Status: AC

## 2019-06-13 MED ORDER — SODIUM CHLORIDE 0.9 % IV SOLN: 250.0000 mL | INTRAVENOUS | Status: DC | PRN

## 2019-06-13 MED ORDER — FENTANYL CITRATE (PF) 100 MCG/2ML IJ SOLN
INTRAMUSCULAR | Status: DC | PRN
  Administered 2019-06-13 (×2): 25 ug via INTRAVENOUS

## 2019-06-13 MED ORDER — ONDANSETRON HCL 4 MG/2ML IJ SOLN: 4.0000 mg | Freq: Four times a day (QID) | INTRAMUSCULAR | Status: DC | PRN

## 2019-06-13 MED ORDER — ACETAMINOPHEN 325 MG PO TABS: 650.0000 mg | ORAL_TABLET | ORAL | Status: DC | PRN

## 2019-06-13 MED ORDER — FENTANYL CITRATE (PF) 100 MCG/2ML IJ SOLN
INTRAMUSCULAR | Status: AC
  Filled 2019-06-13: qty 2

## 2019-06-13 MED ORDER — SODIUM CHLORIDE 0.9% FLUSH: 3.0000 mL | INTRAVENOUS | Status: DC | PRN

## 2019-06-13 MED ORDER — SODIUM CHLORIDE 0.9 % IV SOLN
INTRAVENOUS | Status: AC | PRN
  Administered 2019-06-13: 10:00:00 0.25 mg/kg/h via INTRAVENOUS
  Administered 2019-06-13: 09:00:00 1.75 mg/kg/h via INTRAVENOUS

## 2019-06-13 MED ORDER — MIDAZOLAM HCL 2 MG/2ML IJ SOLN
INTRAMUSCULAR | Status: AC
  Filled 2019-06-13: qty 2

## 2019-06-13 SURGICAL SUPPLY — 19 items
BALLN TREK RX 2.25X12 (BALLOONS) ×3
BALLN ~~LOC~~ TREK RX 2.75X12 (BALLOONS) ×3
BALLOON TREK RX 2.25X12 (BALLOONS) IMPLANT
BALLOON ~~LOC~~ TREK RX 2.75X12 (BALLOONS) IMPLANT
CATH INFINITI 5FR ANG PIGTAIL (CATHETERS) ×2 IMPLANT
CATH INFINITI 5FR JL4 (CATHETERS) ×2 IMPLANT
CATH INFINITI JR4 5F (CATHETERS) ×2 IMPLANT
CATH VISTA GUIDE 6FR JR4 (CATHETERS) ×2 IMPLANT
DEVICE CLOSURE MYNXGRIP 6/7F (Vascular Products) ×2 IMPLANT
DEVICE INFLAT 30 PLUS (MISCELLANEOUS) ×2 IMPLANT
KIT MANI 3VAL PERCEP (MISCELLANEOUS) ×3 IMPLANT
NDL PERC 18GX7CM (NEEDLE) IMPLANT
NEEDLE PERC 18GX7CM (NEEDLE) ×3 IMPLANT
PACK CARDIAC CATH (CUSTOM PROCEDURE TRAY) ×3 IMPLANT
SHEATH AVANTI 5FR X 11CM (SHEATH) ×2 IMPLANT
SHEATH AVANTI 6FR X 11CM (SHEATH) ×2 IMPLANT
STENT RESOLUTE ONYX 2.5X15 (Permanent Stent) ×2 IMPLANT
WIRE ASAHI PROWATER 180CM (WIRE) ×2 IMPLANT
WIRE GUIDERIGHT .035X150 (WIRE) ×4 IMPLANT

## 2019-06-13 NOTE — Progress Notes (Signed)
PROGRESS NOTE    Aaron [name].  JKK:[id number] DOB: [birth year] DOA: 06/12/2019 PCP: Lonie Peak, [name]       Assessment & Plan:   Principal Problem:   Chest [name] Active Problems:   CAD (coronary artery disease)   CKD (chronic kidney disease), stage IIIa   NSTEMI (non-ST elevated myocardial infarction) (HCC)   Dark stools   Type II diabetes mellitus with renal manifestations (HCC)  NSTEMI: s/p cardiac cath w/ stent placed in distal RCA. Hx of CAD. Troponins are elevated. Continue aspirin & effient. Continue on zetia. Nitroglycerin, morphine prn for [name]. Continue on tele. Cardio following and recs apprec  DM2:  with renal manifestations: HbA1c 9.6, uncontrolled. Continue to hold home dose of metformin. Continue on SSI w/ accuchecks   HLD: continue on zetia  CKD stage IIIa: stable. Avoid nephrotoxic meds. Will continue to monitor   Dark stool:  fecal occult ordered. Continue on PPI.    DVT prophylaxis: bivalirudin Code Status: full  Family Communication:  Disposition Plan:    Consultants:   cardio   Procedures: cardiac cath   Antimicrobials:  n/a   Subjective: Pt c/o back [name]   Objective: Vitals:   06/13/19 1230 06/13/19 1300 06/13/19 1330 06/13/19 1403  BP: 124/69 116/68 137/71 138/77  Pulse: 83 84 83 80  Resp: 14 15 14    Temp:    98.6 F (37 C)  TempSrc:    Oral  SpO2: 98% 96% 98% 99%  Weight:      Height:        Intake/Output Summary (Last 24 hours) at 06/13/2019 1527 Last data filed at 06/13/2019 1050 Gross per 24 hour  Intake 1616.94 ml  Output 600 ml  Net 1016.94 ml   Filed Weights   06/12/19 2304 06/13/19 0551 06/13/19 0659  Weight: 106.9 kg 106.9 kg 106.9 kg    Examination:  General exam: Appears calm and comfortable  Respiratory system: Clear to auscultation. Respiratory effort normal. Cardiovascular system: S1 & S2 +.  No rubs, gallops or clicks. Gastrointestinal system: Abdomen is nondistended, soft and  nontender.  Normal bowel sounds heard. Central nervous system: Alert and oriented. Moves all 4 extremities Psychiatry: Judgement and insight appear normal. Mood & affect appropriate.     Data Reviewed: I have personally reviewed following labs and imaging studies  CBC: Recent Labs  Lab 06/12/19 1150 06/12/19 1845 06/12/19 2313 06/13/19 0613 06/13/19 1414  WBC 7.4 7.4 8.9 7.6 5.5  HGB 14.3 13.5 14.8 13.5 13.1  HCT 42.5 40.2 42.5 40.1 38.8*  MCV 87.8 88.4 85.3 88.9 87.8  PLT 221 197 243 211 172   Basic Metabolic Panel: Recent Labs  Lab 06/12/19 1150  NA 134*  K 4.6  CL 99  CO2 25  GLUCOSE 351*  BUN 18  CREATININE 1.23  CALCIUM 8.8*   GFR: Estimated Creatinine Clearance: 66.6 mL/min (by C-G formula based on SCr of 1.23 mg/dL). Liver Function Tests: No results for input(s): AST, ALT, ALKPHOS, BILITOT, PROT, ALBUMIN in the last 168 hours. No results for input(s): LIPASE, AMYLASE in the last 168 hours. No results for input(s): AMMONIA in the last 168 hours. Coagulation Profile: Recent Labs  Lab 06/12/19 1550  INR 1.0   Cardiac Enzymes: No results for input(s): CKTOTAL, CKMB, CKMBINDEX, TROPONINI in the last 168 hours. BNP (last 3 results) No results for input(s): PROBNP in the last 8760 hours. HbA1C: Recent Labs    06/12/19 1408 06/13/19 0613  HGBA1C 10.6* 9.6*   CBG:  Recent Labs  Lab 06/12/19 1813 06/12/19 2301 06/13/19 0744  GLUCAP 237* 198* 247*   Lipid Profile: Recent Labs    06/13/19 0613  CHOL 148  HDL 25*  LDLCALC UNABLE TO CALCULATE IF TRIGLYCERIDE OVER 400 mg/dL  TRIG 505*  CHOLHDL 5.9  LDLDIRECT 65.4   Thyroid Function Tests: No results for input(s): TSH, T4TOTAL, FREET4, T3FREE, THYROIDAB in the last 72 hours. Anemia Panel: No results for input(s): VITAMINB12, FOLATE, FERRITIN, TIBC, IRON, RETICCTPCT in the last 72 hours. Sepsis Labs: No results for input(s): PROCALCITON, LATICACIDVEN in the last 168 hours.  Recent Results (from  the past 240 hour(s))  SARS CORONAVIRUS 2 (TAT 6-24 HRS) Nasopharyngeal Nasopharyngeal Swab     Status: None   Collection Time: 06/12/19  1:34 PM   Specimen: Nasopharyngeal Swab  Result Value Ref Range Status   SARS Coronavirus 2 NEGATIVE NEGATIVE Final    Comment: (NOTE) SARS-CoV-2 target nucleic acids are NOT DETECTED. The SARS-CoV-2 RNA is generally detectable in upper and lower respiratory specimens during the acute phase of infection. Negative results do not preclude SARS-CoV-2 infection, do not rule out co-infections with other pathogens, and should not be used as the sole basis for treatment or other patient management decisions. Negative results must be combined with clinical observations, patient history, and epidemiological information. The expected result is Negative. Fact Sheet for Patients: SugarRoll.be Fact Sheet for Healthcare Providers: https://www.woods-mathews.com/ This test is not yet approved or cleared by the [place] FDA and  has been authorized for detection and/or diagnosis of SARS-CoV-2 by FDA under an Emergency Use Authorization (EUA). This EUA will remain  in effect (meaning this test can be used) for the duration of the COVID-19 declaration under Section 56 4(b)(1) of the Act, 21 U.S.C. section 360bbb-3(b)(1), unless the authorization is terminated or revoked sooner. Performed at [facility], [place] [address], [place], [place] [address]          Radiology Studies: DG Chest 2 View  Result Date: 06/12/2019 CLINICAL DATA:  Chest [name] EXAM: CHEST - 2 VIEW COMPARISON:  April 25, 2019 FINDINGS: Lungs are clear. Heart size and pulmonary vascularity are normal. No adenopathy. No bone lesions. No pneumothorax. There is calcification in the left carotid artery. IMPRESSION: No edema or consolidation. Cardiac silhouette normal. There is left carotid artery calcification. Electronically Signed   By: Lowella Grip III M.D.   On: 06/12/2019 12:59   CARDIAC CATHETERIZATION  Result Date: 06/13/2019  Mid LAD lesion is 15% stenosed.  1st Diag lesion is 90% stenosed.  Ost Cx to Prox Cx lesion is 25% stenosed.  Mid Cx to Dist Cx lesion is 60% stenosed.  Prox Cx to Mid Cx lesion is 70% stenosed.  Mid RCA lesion is 10% stenosed.  Dist RCA lesion is 99% stenosed.  73 year old male with known hypertension hyperlipidemia diabetes and previous PCI and stent placement in the past having acute non-ST elevation myocardial infarction and chest discomfort LV showing inferior and posterior lateral hypokinesis and akinesis with ejection fraction of 40% Patent stent of left anterior descending artery with residual 25% stenosis Patent stent to proximal right coronary artery Continued 60 and 70% stenosis of bifurcation of circumflex to obtuse marginal 1 unchanged from before Worsening stenosis of distal right coronary artery to 99% as the culprit artery Plan PCI and stent placement of distal right coronary artery Dual antiplatelet therapy High intensity cholesterol therapy Hypertension control as before Cardiac rehabilitation   CARDIAC CATHETERIZATION  Result Date: 06/13/2019  Mid LAD lesion is 15% stenosed.  1st Diag lesion is 90% stenosed.  Ost Cx to Prox Cx lesion is 25% stenosed.  Mid Cx to Dist Cx lesion is 60% stenosed.  Prox Cx to Mid Cx lesion is 70% stenosed.  Mid RCA lesion is 10% stenosed.  Dist RCA lesion is 99% stenosed.  A drug-eluting stent was successfully placed using a STENT RESOLUTE ONYX 2.5X15.  Post intervention, there is a 0% residual stenosis.  1.  Non-ST elevation myocardial infarction 2.  High-grade 95% stenosis distal RCA likely culprit lesion 3.  Successful PCI with DES distal RCA        Scheduled Meds: . [START ON 06/14/2019] aspirin  81 mg Oral Daily  . ezetimibe  10 mg Oral Daily  . insulin aspart  0-5 Units Subcutaneous QHS  . insulin aspart  0-9 Units Subcutaneous TID WC    . omega-3 acid ethyl esters  1 g Oral Daily  . oxyCODONE      . pantoprazole  40 mg Oral BID AC  . [START ON 06/14/2019] prasugrel  10 mg Oral Daily  . sodium chloride flush  3 mL Intravenous Q12H  . sodium chloride flush  3 mL Intravenous Q12H   Continuous Infusions: . sodium chloride 340 mL/hr at 06/13/19 0648  . sodium chloride    . sodium chloride       LOS: 0 days    Time spent: 30 mins    Charise KillianJamiese M Jenean Escandon, MD [facility] Pager [phone]  If 7PM-7AM, please contact night-coverage [email or web address] Password [facility] 06/13/2019, 3:27 PM

## 2019-06-13 NOTE — Progress Notes (Signed)
Pt returns from cardiac cath s/p stent placement. Right groin is clean, dry and intact, VSS. Pt is complaining of chronic back pain, and some pain located in left shoulder. Pt is s/p rotator cuff repair. MD notified for home dose of oxycodone. I will continue to assess.

## 2019-06-13 NOTE — Progress Notes (Signed)
Inpatient Diabetes Program Recommendations  AACE/ADA: New Consensus Statement on Inpatient Glycemic Control   Target Ranges:  Prepandial:   less than 140 mg/dL      Peak postprandial:   less than 180 mg/dL (1-2 hours)      Critically ill patients:  140 - 180 mg/dL  Results for Aaron [name], Aaron [name] (MRN [id number]) as of 06/13/2019 09:01  Ref. Range 06/12/2019 18:13 06/12/2019 23:01  Glucose-Capillary Latest Ref Range: 70 - 99 mg/dL 160 (H) 109 (H)  Results for Aaron [name], Aaron [name] (MRN [id number]) as of 06/13/2019 09:01  Ref. Range 06/12/2019 11:50  Glucose Latest Ref Range: 70 - 99 mg/dL 025 (H)   Results for Aaron [name], Aaron [name] (MRN [id number]) as of 06/13/2019 09:01  Ref. Range 06/12/2019 14:08  Hemoglobin A1C Latest Ref Range: 4.8 - 5.6 % 10.6 (H)   Review of Glycemic Control  Diabetes history: DM2 Outpatient Diabetes medications: Metformin 1000 mg BID Current orders for Inpatient glycemic control: Novolog 0-9 units TID with meals, Novolog 0-5 units QHS  Inpatient Diabetes Program Recommendations:   HbgA1C:  A1C 10.6% on 06/12/19 indicating an average glucose of 258 mg/dl over the past 2-3 months.  Patient states that he had recently sporadically taken Metformin for about 1 month and just within a couple of days prior to coming to the hospital his daughter has been trying to make sure he is taking Metformin 1000 mg BID. Encouraged patient to take DM medication as prescribed, to check glucose 2 times per day and to follow up with PCP regarding DM control as he may need additional DM medications as an outpatient.  NOTE: Spoke with patient over the phone about diabetes and home regimen for diabetes control. Patient reports being followed by PCP for diabetes management and he is prescribed Metformin 1000 mg BID. Patient admits that he was not taking Metformin consistently as he was taking it sporadically for about 1 month up until the last couple of days his daughter was making sure  he took it BID.  Patient does not check glucose consistently at home but notes that when he has checked it, it may be anywhere from 120's-300 mg/dl.  Discussed A1C results (10.6% on 06/12/19 ) and explained that current A1C indicates an average glucose of 258 mg/dl over the past 2-3 months. Discussed glucose and A1C goals. Discussed importance of checking CBGs and maintaining good CBG control to prevent long-term and short-term complications. Explained how hyperglycemia leads to damage within blood vessels which lead to the common complications seen with uncontrolled diabetes. Stressed to the patient the importance of improving glycemic control to prevent further complications from uncontrolled diabetes especially given cardiac history and recent NSTEMI. . Discussed impact of nutrition, exercise, stress, sickness, and medications on diabetes control.  Patient states he has been having pain for months with his rotator cuff. Patient states that he has not had any steroids (oral or IM) in the past 3 months. Explained that he may need to have additional DM medications as an outpatient if glucose is uncontrolled with Metformin (when taking consistently).  Encouraged patient to take DM medication as prescribed, check glucose at least 2 times per day, and follow up with PCP regarding DM control.  Patient states he has an appointment in January with PCP.  Patient verbalized understanding of information discussed and reports no further questions at this time related to diabetes.   Thanks, Orlando Penner, RN, MSN, CDE Diabetes Coordinator Inpatient Diabetes Program [phone] (Team Pager from 8am  to 5pm)

## 2019-06-13 NOTE — Consult Note (Signed)
ANTICOAGULATION CONSULT NOTE -  Pharmacy Consult for Heparin Infusion Indication: chest pain/ACS  Allergies  Allergen Reactions  . Ace Inhibitors Other (See Comments)    weakness  . Isosorbide     weakness  . Metoprolol Tartrate Other (See Comments)    weakness  . Statins Nausea And Vomiting and Other (See Comments)    Patient states he gets real weak and headache.  . Sulfa Antibiotics Other (See Comments)    Unknown childhood reaction     Patient Measurements: Height: 6\' 1"  (185.4 cm) Weight: 235 lb 9.6 oz (106.9 kg) IBW/kg (Calculated) : 79.9 Heparin Dosing Weight: 103.9 kg  Vital Signs: Temp: 98.1 F (36.7 C) (12/21 2304) Temp Source: Oral (12/21 2304) BP: 137/88 (12/21 2304) Pulse Rate: 91 (12/21 2304)  Labs: Recent Labs    06/12/19 1150 06/12/19 1408 06/12/19 1550 06/12/19 1744 06/12/19 1845 06/12/19 2313  HGB 14.3  --   --   --  13.5 14.8  HCT 42.5  --   --   --  40.2 42.5  PLT 221  --   --   --  197 243  APTT  --   --  30  --   --   --   LABPROT  --   --  12.8  --   --   --   INR  --   --  1.0  --   --   --   HEPARINUNFRC  --   --   --   --   --  0.16*  CREATININE 1.23  --   --   --   --   --   TROPONINIHS 169* 1,013*  --  5,254*  --  8,718*    Estimated Creatinine Clearance: 66.6 mL/min (by C-G formula based on SCr of 1.23 mg/dL).   Medical History: Past Medical History:  Diagnosis Date  . Cellulitis   . Diabetes mellitus without complication ([place])   . MI, old     Medications:  Medications Prior to Admission  Medication Sig Dispense Refill Last Dose  . ketoconazole (NIZORAL) 2 % shampoo Apply 1 application topically 2 (two) times a week.  3 As directed at Unknown  . metFORMIN (GLUCOPHAGE) 1000 MG tablet Take 1,000 mg by mouth 2 (two) times daily.   06/11/2019 at Unknown time  . nitroGLYCERIN (NITROSTAT) 0.4 MG SL tablet Take 0.4 mg by mouth every 5 (five) minutes as needed for chest pain.    Unknown at PRN  . Omega-3 Fatty Acids (FISH OIL)  1200 MG CAPS Take 2,400 mg by mouth daily.      Marland Kitchen oxyCODONE (ROXICODONE) 5 MG immediate release tablet Take 1-2 tablets (5-10 mg total) by mouth every 4 (four) hours as needed. (Patient taking differently: Take 5 mg by mouth daily as needed for severe pain. ) 50 tablet 0 Past Week at PRN  . traMADol (ULTRAM) 50 MG tablet Take 50 mg by mouth every 6 (six) hours as needed for moderate pain.   Unknown at PRN   Scheduled:  . aspirin  81 mg Oral Pre-Cath  . aspirin EC  325 mg Oral Daily  . ezetimibe  10 mg Oral Daily  . insulin aspart  0-5 Units Subcutaneous QHS  . insulin aspart  0-9 Units Subcutaneous TID WC  . omega-3 acid ethyl esters  1 g Oral Daily  . pantoprazole  40 mg Oral BID AC  . sodium chloride flush  3 mL Intravenous Q12H   Infusions:  .  sodium chloride    . sodium chloride 75 mL/hr at 06/12/19 1406  . sodium chloride     Followed by  . sodium chloride    . heparin 1,350 Units/hr (06/12/19 1535)   PRN: sodium chloride, acetaminophen, morphine injection, nitroGLYCERIN, ondansetron (ZOFRAN) IV, oxyCODONE, sodium chloride flush Anti-infectives (From admission, onward)   None      Assessment: Pharmacy has been consulted for Heparin Infusion on 73yo patient chest pain. Has had a cardiac catheterization in 2018 showing right coronary artery and left anterior descending artery stent with moderate atherosclerosis of distal right coronary artery and circumflex obtuse marginal artery. Patient has had recent shoulder surgery for which she did fairly well but in the interim has had several episodes of substernal chest discomfort.  Troponin level of 169>1,013. Baseline labs have been ordered and are pending.   12/21 @ 2313 HL 0.16, subtherapeutic  Goal of Therapy:  Heparin level 0.3-0.7 units/ml Monitor platelets by anticoagulation protocol: Yes   Plan:  Will give 2000 unit bolus and increase infusion to 1550 units/hr Recheck HL in 8 hours  Valrie Hart A 06/13/2019,12:33 AM

## 2019-06-13 NOTE — Progress Notes (Signed)
[facility] Encounter Note  Patient: Aaron [name]. / Admit Date: 06/12/2019 / Date of Encounter: 06/13/2019, 8:31 AM   Subjective: Patient felt well overnight with no evidence of worsening chest discomfort.  Troponin suggestive of non-ST elevation myocardial infarction Cardiac catheterization showing Mild to moderate LV systolic dysfunction with inferior posterior lateral myocardial infarction and a hypokinesis with ejection fraction of 40% Patent stent of proximal left anterior descending artery Patent stent of proximal right coronary artery Moderate stenosis of circumflex obtuse marginal bifurcation of 65% unchanged from before Significant new distal right coronary artery stenosis of 99%  Review of Systems: Positive for: Chest pain Negative for: Vision change, hearing change, syncope, dizziness, nausea, vomiting,diarrhea, bloody stool, stomach pain, cough, congestion, diaphoresis, urinary frequency, urinary pain,skin lesions, skin rashes Others previously listed  Objective: Telemetry: Normal sinus rhythm Physical Exam: Blood pressure (!) 144/77, pulse 90, temperature 98.2 F (36.8 C), temperature source Oral, resp. rate 12, height 6\' 1"  (1.854 m), weight 106.9 kg, SpO2 96 %. Body mass index is 31.09 kg/m. General: Well developed, well nourished, in no acute distress. Head: Normocephalic, atraumatic, sclera non-icteric, no xanthomas, nares are without discharge. Neck: No apparent masses Lungs: Normal respirations with no wheezes, no rhonchi, no rales , no crackles   Heart: Regular rate and rhythm, normal S1 S2, no murmur, no rub, no gallop, PMI is normal size and placement, carotid upstroke normal without bruit, jugular venous pressure normal Abdomen: Soft, non-tender, non-distended with normoactive bowel sounds. No hepatosplenomegaly. Abdominal aorta is normal size without bruit Extremities: No edema, no clubbing, no cyanosis, no ulcers,  Peripheral:  2+ radial, 2+ femoral, 2+ dorsal pedal pulses Neuro: Alert and oriented. Moves all extremities spontaneously. Psych:  Responds to questions appropriately with a normal affect.   Intake/Output Summary (Last 24 hours) at 06/13/2019 0831 Last data filed at 06/13/2019 0648 Gross per 24 hour  Intake 1616.94 ml  Output 300 ml  Net 1316.94 ml    Inpatient Medications:  . [MAR Hold] aspirin EC  325 mg Oral Daily  . [MAR Hold] ezetimibe  10 mg Oral Daily  . [MAR Hold] insulin aspart  0-5 Units Subcutaneous QHS  . [MAR Hold] insulin aspart  0-9 Units Subcutaneous TID WC  . [MAR Hold] omega-3 acid ethyl esters  1 g Oral Daily  . [MAR Hold] pantoprazole  40 mg Oral BID AC  . [MAR Hold] sodium chloride flush  3 mL Intravenous Q12H   Infusions:  . sodium chloride 340 mL/hr at 06/13/19 0648  . heparin 1,550 Units/hr (06/13/19 0648)    Labs: Recent Labs    06/12/19 1150  NA 134*  K 4.6  CL 99  CO2 25  GLUCOSE 351*  BUN 18  CREATININE 1.23  CALCIUM 8.8*   No results for input(s): AST, ALT, ALKPHOS, BILITOT, PROT, ALBUMIN in the last 72 hours. Recent Labs    06/12/19 2313 06/13/19 0613  WBC 8.9 7.6  HGB 14.8 13.5  HCT 42.5 40.1  MCV 85.3 88.9  PLT 243 211   No results for input(s): CKTOTAL, CKMB, TROPONINI in the last 72 hours. Invalid input(s): POCBNP Recent Labs    06/12/19 1408  HGBA1C 10.6*     Weights: Filed Weights   06/12/19 2304 06/13/19 0551 06/13/19 0659  Weight: 106.9 kg 106.9 kg 106.9 kg     Radiology/Studies:  DG Chest 2 View  Result Date: 06/12/2019 CLINICAL DATA:  Chest pain EXAM: CHEST - 2 VIEW COMPARISON:  April 25, 2019  FINDINGS: Lungs are clear. Heart size and pulmonary vascularity are normal. No adenopathy. No bone lesions. No pneumothorax. There is calcification in the left carotid artery. IMPRESSION: No edema or consolidation. Cardiac silhouette normal. There is left carotid artery calcification. Electronically Signed   By: Bretta Bang  III M.D.   On: 06/12/2019 12:59     Assessment and Recommendation  73 y.o. male with known coronary artery disease hypertension hyperlipidemia diabetes on appropriate medication management with non-ST elevation myocardial infarction and cardiac catheterization showing culprit new stenosis of distal right coronary artery 1.  PCI and stent placement to distal right coronary artery 2.  Dual antiplatelet therapy 3.  Continuation of medication management for further risk reduction including diabetes hypertension and lipid control 4.  High intensity cholesterol therapy 5.  Begin cardiac rehabilitation  Signed, Arnoldo Hooker M.D. FACC

## 2019-06-14 DIAGNOSIS — R195 Other fecal abnormalities: Secondary | ICD-10-CM

## 2019-06-14 LAB — CBC
HCT: 37.6 % — ABNORMAL LOW (ref 39.0–52.0)
HCT: 37.7 % — ABNORMAL LOW (ref 39.0–52.0)
HCT: 39.6 % (ref 39.0–52.0)
Hemoglobin: 12.7 g/dL — ABNORMAL LOW (ref 13.0–17.0)
Hemoglobin: 13.1 g/dL (ref 13.0–17.0)
Hemoglobin: 13.3 g/dL (ref 13.0–17.0)
MCH: 29.8 pg (ref 26.0–34.0)
MCH: 29.8 pg (ref 26.0–34.0)
MCH: 30 pg (ref 26.0–34.0)
MCHC: 33.1 g/dL (ref 30.0–36.0)
MCHC: 33.7 g/dL (ref 30.0–36.0)
MCHC: 35.4 g/dL (ref 30.0–36.0)
MCV: 84.7 fL (ref 80.0–100.0)
MCV: 88.5 fL (ref 80.0–100.0)
MCV: 90.2 fL (ref 80.0–100.0)
Platelets: 162 10*3/uL (ref 150–400)
Platelets: 165 10*3/uL (ref 150–400)
Platelets: 193 10*3/uL (ref 150–400)
RBC: 4.26 MIL/uL (ref 4.22–5.81)
RBC: 4.39 MIL/uL (ref 4.22–5.81)
RBC: 4.44 MIL/uL (ref 4.22–5.81)
RDW: 15.6 % — ABNORMAL HIGH (ref 11.5–15.5)
RDW: 15.7 % — ABNORMAL HIGH (ref 11.5–15.5)
RDW: 15.8 % — ABNORMAL HIGH (ref 11.5–15.5)
WBC: 6.2 10*3/uL (ref 4.0–10.5)
WBC: 6.5 10*3/uL (ref 4.0–10.5)
WBC: 7.6 10*3/uL (ref 4.0–10.5)
nRBC: 0 % (ref 0.0–0.2)
nRBC: 0 % (ref 0.0–0.2)
nRBC: 0 % (ref 0.0–0.2)

## 2019-06-14 LAB — BASIC METABOLIC PANEL
Anion gap: 7 (ref 5–15)
BUN: 11 mg/dL (ref 8–23)
CO2: 23 mmol/L (ref 22–32)
Calcium: 8 mg/dL — ABNORMAL LOW (ref 8.9–10.3)
Chloride: 105 mmol/L (ref 98–111)
Creatinine, Ser: 1.12 mg/dL (ref 0.61–1.24)
GFR calc Af Amer: >60 mL/min (ref >60)
GFR calc non Af Amer: >60 mL/min (ref >60)
Glucose, Bld: 266 mg/dL — ABNORMAL HIGH (ref 70–99)
Potassium: 3.9 mmol/L (ref 3.5–5.1)
Sodium: 135 mmol/L (ref 135–145)

## 2019-06-14 LAB — GLUCOSE, CAPILLARY
Glucose-Capillary: 254 mg/dL — ABNORMAL HIGH (ref 70–99)
Glucose-Capillary: 350 mg/dL — ABNORMAL HIGH (ref 70–99)

## 2019-06-14 MED ORDER — LOSARTAN POTASSIUM 25 MG PO TABS
25.0000 mg | ORAL_TABLET | Freq: Every day | ORAL | Status: DC
  Administered 2019-06-14: 25 mg via ORAL
  Filled 2019-06-14: qty 1

## 2019-06-14 MED ORDER — ASPIRIN 81 MG PO CHEW: 81.0000 mg | CHEWABLE_TABLET | Freq: Every day | ORAL | Status: DC

## 2019-06-14 MED ORDER — PRASUGREL HCL 10 MG PO TABS: 10.0000 mg | ORAL_TABLET | Freq: Every day | ORAL | 0 refills | Status: DC

## 2019-06-14 MED ORDER — PANTOPRAZOLE SODIUM 40 MG PO TBEC: 40.0000 mg | DELAYED_RELEASE_TABLET | Freq: Two times a day (BID) | ORAL | 0 refills | Status: DC

## 2019-06-14 MED ORDER — CARVEDILOL 3.125 MG PO TABS: 3.1250 mg | ORAL_TABLET | Freq: Two times a day (BID) | ORAL | Status: DC

## 2019-06-14 MED ORDER — LOSARTAN POTASSIUM 25 MG PO TABS: 25.0000 mg | ORAL_TABLET | Freq: Every day | ORAL | 0 refills | Status: DC

## 2019-06-14 MED ORDER — EZETIMIBE 10 MG PO TABS: 10.0000 mg | ORAL_TABLET | Freq: Every day | ORAL | 0 refills | Status: AC

## 2019-06-14 NOTE — Progress Notes (Signed)
Inpatient Diabetes Program Recommendations  AACE/ADA: New Consensus Statement on Inpatient Glycemic Control (2015)  Target Ranges:  Prepandial:   less than 140 mg/dL      Peak postprandial:   less than 180 mg/dL (1-2 hours)      Critically ill patients:  140 - 180 mg/dL   Lab Results  Component Value Date   GLUCAP 254 (H) 06/14/2019   HGBA1C 9.6 (H) 06/13/2019    Review of Glycemic Control Results for Aaron [name], Aaron [name] (MRN [id number]) as of 06/14/2019 09:45  Ref. Range 06/13/2019 07:44 06/13/2019 17:26 06/13/2019 21:44 06/14/2019 07:49  Glucose-Capillary Latest Ref Range: 70 - 99 mg/dL 247 (H) 257 (H) 309 (H) 254 (H)   Diabetes history: DM 2 Outpatient Diabetes medications: Metformin 1000 mg bid Current orders for Inpatient glycemic control:  Novolog sensitive tid with meals and HS  Inpatient Diabetes Program Recommendations:   Please consider adding Levemir 10 units bid while in the hospital.   Thanks  Adah Perl, RN, BC-ADM Inpatient Diabetes Coordinator Pager [phone] (8a-5p)

## 2019-06-14 NOTE — Progress Notes (Signed)
Cardiovascular and Pulmonary Nurse Navigator Note:    73 y.o. male with medical history significant of diabetes mellitus, HLD (allergic to statin), CAD, stent placement, CKD-3, who presents with chest pain.  Patient ruled in for non-STEMI.  Patient underwent cardiac catheterization requiring PCI and stent placement of distal RCA without complications.     Cardiac catheterization revealed:   Mild to moderate LV systolic dysfunction with inferior posterior lateral myocardial infarction and a hypokinesis with ejection fraction of 40% Patent stent of proximal left anterior descending artery Patent stent of proximal right coronary artery Moderate stenosis of circumflex obtuse marginal bifurcation of 65% unchanged from before Significant new distal right coronary artery stenosis of 99%  CORONARY STENT INTERVENTION  Conclusion 06/13/2019   Mid LAD lesion is 15% stenosed. 1st Diag lesion is 90% stenosed. Ost Cx to Prox Cx lesion is 25% stenosed. Mid Cx to Dist Cx lesion is 60% stenosed. Prox Cx to Mid Cx lesion is 70% stenosed. Mid RCA lesion is 10% stenosed. Dist RCA lesion is 99% stenosed. A drug-eluting stent was successfully placed using a STENT RESOLUTE ONYX 2.5X15. Post intervention, there is a 0% residual stenosis.   1.  Non-ST elevation myocardial infarction 2.  High-grade 95% stenosis distal RCA likely culprit lesion 3.  Successful PCI with DES distal RCA    EDUCATION:   "Heart Attack Bouncing Back" booklet given and reviewed with patient. Discussed the definition of CAD. Reviewed the location of CAD and where his stent was placed. Informed patient he will be given a stent card. Explained the purpose of the stent card. Instructed patient to keep stent card in his wallet.   Discussed modifiable risk factors including controlling blood pressure, cholesterol, and blood sugar; following heart healthy diet; maintaining healthy weight; exercise; and smoking cessation, if applicable.    Discussed cardiac medications including rationale for taking, mechanisms of action, and side effects. Stressed the importance of taking medications as prescribed. Patient concerned about being on a beta blocker as he stated he is allergic to beta blockers.  What was listed on his allergies was metoprolol - not all beta blockers.  Assigned RN informed of patient's concern about being on beta blocker and cholesterol medication. Patient is allergic to statins.  This RN explained that Zetia is not a statin.  Assigned RN informed of patient's concern about being on beta blocker and Zetia - cholesterol medication.   Discussed emergency plan for heart attack symptoms. Patient verbalized understanding of need to call 911 and not to drive himself to ER if having cardiac symptoms / chest pain.   Diet of low sodium, low fat, low cholesterol heart healthy / carb modified diet discussed. Information on diet provided.  Smoking Cessation - Patient is a FORMER smoker.    Exercise - Benefits of exercised discussed. Patient reporting he is currently recovering from rotator cuff surgery and is receiving outpatient rehab.  Explained to patient his cardiologist has referred him to outpatient Cardiac Rehab. An overview of the program was provided. Informational letter with CPT billing codes given to patient.  Patient wanting to wait 6 weeks prior to considering Cardiac Rehab, as patient needs to complete rehab for his rotator cuff surgery / recovery.  Patient agreeable to Cardiac Rehab department contacting him by phone in 6 weeks to schedule his first appointment.    Patient appreciative of the above information.    Roanna Epley, RN, BSN, [facility]  Cardiovascular & Pulmonary Nurse Navigator  Direct Line: [phone]  Department Phone #: [phone] Fax: [phone]  Email Address: [email or web address]

## 2019-06-14 NOTE — Discharge Summary (Signed)
Aaron [name]. ZOX:[id number] DOB: 05-Jul-1945 DOA: 06/12/2019  PCP: Aaron Peak, [name]  Admit date: 06/12/2019 Discharge date: 06/14/2019  Admitted From: Home Disposition: Home  Recommendations for Outpatient Follow-up:  1. Follow up with PCP in 1 week 2. Please obtain BMP/CBC in one week 3. Please follow up on the following pending results: None 4. Cardiology next week  Home Health: None   Discharge Condition:Stable CODE STATUS: Full Diet recommendation: Heart Healthy  Brief/Interim Summary: Aaron [name]. is a 73 y.o. male with medical history significant of diabetes mellitus, HLD (allergic to statin), CAD, stent placement, CKD-3, who presents with chest [name].  Found with non-STEMI.  Cardiology was consulted.  Patient underwent cardiac catheterization requiring PCI and stent placement of distal RCA without complications.  Started on dual antiplatelet therapy and recommended cardiac rehab and high intensity cholesterol therapy.  Patient had mentioned her school however fecal occult was ordered with still pending as he has had no bowel movement.  His H&H has remained stable.  He was continued on PPI.  His hemoglobin A1c was 9.6.  His home dose of Metformin was held he was started on insulin sliding scale.  Today he feels better and from cardiac standpoint he is stable to be discharged home per cardiology.  Cardiac catheterization showing Mild to moderate LV systolic dysfunction with inferior posterior lateral myocardial infarction and a hypokinesis with ejection fraction of 40% Patent stent of proximal left anterior descending artery Patent stent of proximal right coronary artery Moderate stenosis of circumflex obtuse marginal bifurcation of 65% unchanged from before Significant new distal right coronary artery stenosis of 99%  Discharge Diagnoses:  Principal Problem:   Chest [name] Active Problems:   CAD (coronary artery disease)   CKD (chronic kidney disease),  stage IIIa   NSTEMI (non-ST elevated myocardial infarction) (HCC)   Dark stools   Type II diabetes mellitus with renal manifestations [facility])    Discharge Instructions  Discharge Instructions    AMB Referral to Cardiac Rehabilitation - Phase II   Complete by: As directed    Diagnosis:  NSTEMI Coronary Stents     After initial evaluation and assessments completed: Virtual Based Care may be provided alone or in conjunction with Phase 2 Cardiac Rehab based on patient barriers.: Yes   Call MD for:   Complete by: As directed    Chest [name]   Diet - low sodium heart healthy   Complete by: As directed    Discharge instructions   Complete by: As directed    Follow-up with cardiology Arnoldo Hooker M.D next week Follow-up with primary care in 1 week have BMP and CBC obtained   Increase activity slowly   Complete by: As directed      Allergies as of 06/14/2019      Reactions   Ace Inhibitors Other (See Comments)   weakness   Isosorbide    weakness   Metoprolol Tartrate Other (See Comments)   weakness   Statins Nausea And Vomiting, Other (See Comments)   Patient states he gets real weak and headache.   Sulfa Antibiotics Other (See Comments)   Unknown childhood reaction       Medication List    TAKE these medications   aspirin 81 MG chewable tablet Chew 1 tablet (81 mg total) by mouth daily. Start taking on: June 15, 2019   ezetimibe 10 MG tablet Commonly known as: ZETIA Take 1 tablet (10 mg total) by mouth daily. Start taking on: June 15, 2019  Fish Oil 1200 MG Caps Take 2,400 mg by mouth daily.   ketoconazole 2 % shampoo Commonly known as: NIZORAL Apply 1 application topically 2 (two) times a week.   losartan 25 MG tablet Commonly known as: COZAAR Take 1 tablet (25 mg total) by mouth daily. Start taking on: June 15, 2019   metFORMIN 1000 MG tablet Commonly known as: GLUCOPHAGE Take 1,000 mg by mouth 2 (two) times daily.   Nitrostat 0.4 MG SL  tablet Generic drug: nitroGLYCERIN Take 0.4 mg by mouth every 5 (five) minutes as needed for chest [name].   oxyCODONE 5 MG immediate release tablet Commonly known as: Roxicodone Take 1-2 tablets (5-10 mg total) by mouth every 4 (four) hours as needed. What changed:   how much to take  when to take this  reasons to take this   pantoprazole 40 MG tablet Commonly known as: PROTONIX Take 1 tablet (40 mg total) by mouth 2 (two) times daily before a meal.   prasugrel 10 MG Tabs tablet Commonly known as: EFFIENT Take 1 tablet (10 mg total) by mouth daily. Start taking on: June 15, 2019   traMADol 50 MG tablet Commonly known as: ULTRAM Take 50 mg by mouth every 6 (six) hours as needed for moderate [name].      Follow-up Information    [facility] Follow up.   Specialty: Cardiac Rehabilitation Why: Your Cardiologist has referred you to outpatient Cardiac Rehab at [facility].  As per your discussion with Army Melia, RN, Cardiac Nurse Navigator, you would like to wait until after you complete rehab for rotator cuff surgery before starting this program.   Contact information: [address] [id number] [place] [address] [phone]       Lamar Blinks, MD Follow up in 4 day(s).   Specialty: Cardiology Contact information: [address] [facility] [place] [address] [phone]        Aaron Peak, [name] Follow up in 5 day(s).   Specialty: Physician Assistant Why: needs cbc and bmp Contact information: [address] [place] [address] [phone]          Allergies  Allergen Reactions  . Ace Inhibitors Other (See Comments)    weakness  . Isosorbide     weakness  . Metoprolol Tartrate Other (See Comments)    weakness  . Statins Nausea And Vomiting and Other (See Comments)    Patient states he gets real weak and headache.  . Sulfa Antibiotics Other (See Comments)     Unknown childhood reaction     Consultations:  Cardiology   Procedures/Studies: DG Chest 2 View  Result Date: 06/12/2019 CLINICAL DATA:  Chest [name] EXAM: CHEST - 2 VIEW COMPARISON:  April 25, 2019 FINDINGS: Lungs are clear. Heart size and pulmonary vascularity are normal. No adenopathy. No bone lesions. No pneumothorax. There is calcification in the left carotid artery. IMPRESSION: No edema or consolidation. Cardiac silhouette normal. There is left carotid artery calcification. Electronically Signed   By: Bretta Bang III M.D.   On: 06/12/2019 12:59   CARDIAC CATHETERIZATION  Result Date: 06/13/2019  Mid LAD lesion is 15% stenosed.  1st Diag lesion is 90% stenosed.  Ost Cx to Prox Cx lesion is 25% stenosed.  Mid Cx to Dist Cx lesion is 60% stenosed.  Prox Cx to Mid Cx lesion is 70% stenosed.  Mid RCA lesion is 10% stenosed.  Dist RCA lesion is 99% stenosed.  73 year old male with known  hypertension hyperlipidemia diabetes and previous PCI and stent placement in the past having acute non-ST elevation myocardial infarction and chest discomfort LV showing inferior and posterior lateral hypokinesis and akinesis with ejection fraction of 40% Patent stent of left anterior descending artery with residual 25% stenosis Patent stent to proximal right coronary artery Continued 60 and 70% stenosis of bifurcation of circumflex to obtuse marginal 1 unchanged from before Worsening stenosis of distal right coronary artery to 99% as the culprit artery Plan PCI and stent placement of distal right coronary artery Dual antiplatelet therapy High intensity cholesterol therapy Hypertension control as before Cardiac rehabilitation   CARDIAC CATHETERIZATION  Result Date: 06/13/2019  Mid LAD lesion is 15% stenosed.  1st Diag lesion is 90% stenosed.  Ost Cx to Prox Cx lesion is 25% stenosed.  Mid Cx to Dist Cx lesion is 60% stenosed.  Prox Cx to Mid Cx lesion is 70% stenosed.  Mid RCA lesion is 10%  stenosed.  Dist RCA lesion is 99% stenosed.  A drug-eluting stent was successfully placed using a STENT RESOLUTE ONYX 2.5X15.  Post intervention, there is a 0% residual stenosis.  1.  Non-ST elevation myocardial infarction 2.  High-grade 95% stenosis distal RCA likely culprit lesion 3.  Successful PCI with DES distal RCA       Subjective: Patient denies chest [name], shortness of breath, or any other symptoms  Discharge Exam: Vitals:   06/14/19 0439 06/14/19 0747  BP: (!) 149/77 (!) 122/49  Pulse: 83 71  Resp: 20   Temp: 98.1 F (36.7 C) 97.9 F (36.6 C)  SpO2: 99% 98%   Vitals:   06/13/19 1723 06/13/19 1944 06/14/19 0439 06/14/19 0747  BP: 129/83 123/62 (!) 149/77 (!) 122/49  Pulse: 77 85 83 71  Resp:  18 20   Temp: 98.1 F (36.7 C) 98.7 F (37.1 C) 98.1 F (36.7 C) 97.9 F (36.6 C)  TempSrc: Oral Oral Oral Oral  SpO2: 100% 97% 99% 98%  Weight:   108.1 kg   Height:        General: Pt is alert, awake, not in acute distress Cardiovascular: RRR, S1/S2 +, no rubs, no gallops Respiratory: CTA bilaterally, no wheezing, no rhonchi Abdominal: Soft, NT, ND, bowel sounds + Extremities: no edema, no cyanosis Neuro: Cranial nerves grossly intact    The results of significant diagnostics from this hospitalization (including imaging, microbiology, ancillary and laboratory) are listed below for reference.     Microbiology: Recent Results (from the past 240 hour(s))  SARS CORONAVIRUS 2 (TAT 6-24 HRS) Nasopharyngeal Nasopharyngeal Swab     Status: None   Collection Time: 06/12/19  1:34 PM   Specimen: Nasopharyngeal Swab  Result Value Ref Range Status   SARS Coronavirus 2 NEGATIVE NEGATIVE Final    Comment: (NOTE) SARS-CoV-2 target nucleic acids are NOT DETECTED. The SARS-CoV-2 RNA is generally detectable in upper and lower respiratory specimens during the acute phase of infection. Negative results do not preclude SARS-CoV-2 infection, do not rule out co-infections with  other pathogens, and should not be used as the sole basis for treatment or other patient management decisions. Negative results must be combined with clinical observations, patient history, and epidemiological information. The expected result is Negative. Fact Sheet for Patients: HairSlick.nohttps://www.fda.gov/media/138098/download Fact Sheet for Healthcare Providers: quierodirigir.comhttps://www.fda.gov/media/138095/download This test is not yet approved or cleared by the [place] FDA and  has been authorized for detection and/or diagnosis of SARS-CoV-2 by FDA under an Emergency Use Authorization (EUA). This EUA will remain  in effect (meaning this test can be used) for the duration of the COVID-19 declaration under Section 56 4(b)(1) of the Act, 21 U.S.C. section 360bbb-3(b)(1), unless the authorization is terminated or revoked sooner. Performed at [facility], [place] [address] [place], [place] [address]      Labs: BNP (last 3 results) No results for input(s): BNP in the last 8760 hours. Basic Metabolic Panel: Recent Labs  Lab 06/12/19 1150 06/14/19 0623  NA 134* 135  K 4.6 3.9  CL 99 105  CO2 25 23  GLUCOSE 351* 266*  BUN 18 11  CREATININE 1.23 1.12  CALCIUM 8.8* 8.0*   Liver Function Tests: No results for input(s): AST, ALT, ALKPHOS, BILITOT, PROT, ALBUMIN in the last 168 hours. No results for input(s): LIPASE, AMYLASE in the last 168 hours. No results for input(s): AMMONIA in the last 168 hours. CBC: Recent Labs  Lab 06/13/19 1414 06/13/19 1748 06/13/19 2359 06/14/19 0623 06/14/19 1152  WBC 5.5 6.4 7.6 6.2 6.5  HGB 13.1 12.8* 13.1 12.7* 13.3  HCT 38.8* 37.5* 39.6 37.7* 37.6*  MCV 87.8 88.0 90.2 88.5 84.7  PLT 172 170 193 162 165   Cardiac Enzymes: No results for input(s): CKTOTAL, CKMB, CKMBINDEX, TROPONINI in the last 168 hours. BNP: Invalid input(s): POCBNP CBG: Recent Labs  Lab 06/13/19 0744 06/13/19 1726 06/13/19 2144 06/14/19 0749 06/14/19 1124  GLUCAP  247* 257* 309* 254* 350*   D-Dimer No results for input(s): DDIMER in the last 72 hours. Hgb A1c Recent Labs    06/12/19 1408 06/13/19 0613  HGBA1C 10.6* 9.6*   Lipid Profile Recent Labs    06/13/19 0613  CHOL 148  HDL 25*  LDLCALC UNABLE TO CALCULATE IF TRIGLYCERIDE OVER 400 mg/dL  TRIG 505*  CHOLHDL 5.9  LDLDIRECT 65.4   Thyroid function studies No results for input(s): TSH, T4TOTAL, T3FREE, THYROIDAB in the last 72 hours.  Invalid input(s): FREET3 Anemia work up No results for input(s): VITAMINB12, FOLATE, FERRITIN, TIBC, IRON, RETICCTPCT in the last 72 hours. Urinalysis    Component Value Date/Time   COLORURINE STRAW (A) 02/24/2015 1340   APPEARANCEUR CLEAR (A) 02/24/2015 1340   APPEARANCEUR CLEAR 09/12/2014 0842   LABSPEC 1.011 02/24/2015 1340   LABSPEC 1.019 09/12/2014 0842   PHURINE 5.0 02/24/2015 1340   GLUCOSEU NEGATIVE 02/24/2015 1340   GLUCOSEU NEGATIVE 09/12/2014 0842   HGBUR NEGATIVE 02/24/2015 1340   BILIRUBINUR NEGATIVE 02/24/2015 1340   BILIRUBINUR NEGATIVE 09/12/2014 0842   KETONESUR NEGATIVE 02/24/2015 1340   PROTEINUR NEGATIVE 02/24/2015 1340   NITRITE NEGATIVE 02/24/2015 1340   LEUKOCYTESUR NEGATIVE 02/24/2015 1340   LEUKOCYTESUR NEGATIVE 09/12/2014 0842   Sepsis Labs Invalid input(s): PROCALCITONIN,  WBC,  LACTICIDVEN Microbiology Recent Results (from the past 240 hour(s))  SARS CORONAVIRUS 2 (TAT 6-24 HRS) Nasopharyngeal Nasopharyngeal Swab     Status: None   Collection Time: 06/12/19  1:34 PM   Specimen: Nasopharyngeal Swab  Result Value Ref Range Status   SARS Coronavirus 2 NEGATIVE NEGATIVE Final    Comment: (NOTE) SARS-CoV-2 target nucleic acids are NOT DETECTED. The SARS-CoV-2 RNA is generally detectable in upper and lower respiratory specimens during the acute phase of infection. Negative results do not preclude SARS-CoV-2 infection, do not rule out co-infections with other pathogens, and should not be used as the sole basis  for treatment or other patient management decisions. Negative results must be combined with clinical observations, patient history, and epidemiological information. The expected result is Negative. Fact Sheet for Patients:  HairSlick.no Fact Sheet for Healthcare Providers: quierodirigir.com This test is not yet approved or cleared by the [place] FDA and  has been authorized for detection and/or diagnosis of SARS-CoV-2 by FDA under an Emergency Use Authorization (EUA). This EUA will remain  in effect (meaning this test can be used) for the duration of the COVID-19 declaration under Section 56 4(b)(1) of the Act, 21 U.S.C. section 360bbb-3(b)(1), unless the authorization is terminated or revoked sooner. Performed at [facility], [address] [place], [place] [address]      Time coordinating discharge: Over 30 minutes  SIGNED:   Lynn Ito, MD  [facility] 06/14/2019, 2:19 PM Pager   If 7PM-7AM, please contact night-coverage [email or web address] Password [email or web address]

## 2019-06-14 NOTE — Progress Notes (Signed)
[facility] Encounter Note  Patient: Aaron [name]. / Admit Date: 06/12/2019 / Date of Encounter: 06/14/2019, 8:34 AM   Subjective: Patient felt well overnight with no evidence of worsening chest discomfort.  Troponin suggestive of non-ST elevation myocardial infarction Patient feeling much better after PCI and stent placement of distal right coronary artery and had no complications Cardiac catheterization showing Mild to moderate LV systolic dysfunction with inferior posterior lateral myocardial infarction and a hypokinesis with ejection fraction of 40% Patent stent of proximal left anterior descending artery Patent stent of proximal right coronary artery Moderate stenosis of circumflex obtuse marginal bifurcation of 65% unchanged from before Significant new distal right coronary artery stenosis of 99% Now status post PCI and stent placement of distal right coronary artery without complications Review of Systems: Positive for: None Negative for: Vision change, hearing change, syncope, dizziness, nausea, vomiting,diarrhea, bloody stool, stomach pain, cough, congestion, diaphoresis, urinary frequency, urinary pain,skin lesions, skin rashes Others previously listed  Objective: Telemetry: Normal sinus rhythm Physical Exam: Blood pressure (!) 122/49, pulse 71, temperature 97.9 F (36.6 C), temperature source Oral, resp. rate 20, height 6\' 1"  (1.854 m), weight 108.1 kg, SpO2 98 %. Body mass index is 31.44 kg/m. General: Well developed, well nourished, in no acute distress. Head: Normocephalic, atraumatic, sclera non-icteric, no xanthomas, nares are without discharge. Neck: No apparent masses Lungs: Normal respirations with no wheezes, no rhonchi, no rales , no crackles   Heart: Regular rate and rhythm, normal S1 S2, no murmur, no rub, no gallop, PMI is normal size and placement, carotid upstroke normal without bruit, jugular venous pressure normal Abdomen:  Soft, non-tender, non-distended with normoactive bowel sounds. No hepatosplenomegaly. Abdominal aorta is normal size without bruit Extremities: No edema, no clubbing, no cyanosis, no ulcers,  Peripheral: 2+ radial, 2+ femoral, 2+ dorsal pedal pulses Neuro: Alert and oriented. Moves all extremities spontaneously. Psych:  Responds to questions appropriately with a normal affect.   Intake/Output Summary (Last 24 hours) at 06/14/2019 0834 Last data filed at 06/14/2019 0729 Gross per 24 hour  Intake 50.67 ml  Output 1600 ml  Net -1549.33 ml    Inpatient Medications:  . aspirin  81 mg Oral Daily  . ezetimibe  10 mg Oral Daily  . insulin aspart  0-5 Units Subcutaneous QHS  . insulin aspart  0-9 Units Subcutaneous TID WC  . omega-3 acid ethyl esters  1 g Oral Daily  . pantoprazole  40 mg Oral BID AC  . prasugrel  10 mg Oral Daily  . sodium chloride flush  3 mL Intravenous Q12H  . sodium chloride flush  3 mL Intravenous Q12H   Infusions:  . sodium chloride 340 mL/hr at 06/13/19 0648  . sodium chloride      Labs: Recent Labs    06/12/19 1150 06/14/19 0623  NA 134* 135  K 4.6 3.9  CL 99 105  CO2 25 23  GLUCOSE 351* 266*  BUN 18 11  CREATININE 1.23 1.12  CALCIUM 8.8* 8.0*   No results for input(s): AST, ALT, ALKPHOS, BILITOT, PROT, ALBUMIN in the last 72 hours. Recent Labs    06/13/19 2359 06/14/19 0623  WBC 7.6 6.2  HGB 13.1 12.7*  HCT 39.6 37.7*  MCV 90.2 88.5  PLT 193 162   No results for input(s): CKTOTAL, CKMB, TROPONINI in the last 72 hours. Invalid input(s): POCBNP Recent Labs    06/13/19 0613  HGBA1C 9.6*     Weights: 06/15/19   06/13/19  2297 06/13/19 0659 06/14/19 0439  Weight: 106.9 kg 106.9 kg 108.1 kg     Radiology/Studies:  DG Chest 2 View  Result Date: 06/12/2019 CLINICAL DATA:  Chest pain EXAM: CHEST - 2 VIEW COMPARISON:  April 25, 2019 FINDINGS: Lungs are clear. Heart size and pulmonary vascularity are normal. No adenopathy. No  bone lesions. No pneumothorax. There is calcification in the left carotid artery. IMPRESSION: No edema or consolidation. Cardiac silhouette normal. There is left carotid artery calcification. Electronically Signed   By: Lowella Grip III M.D.   On: 06/12/2019 12:59   CARDIAC CATHETERIZATION  Result Date: 06/13/2019  Mid LAD lesion is 15% stenosed.  1st Diag lesion is 90% stenosed.  Ost Cx to Prox Cx lesion is 25% stenosed.  Mid Cx to Dist Cx lesion is 60% stenosed.  Prox Cx to Mid Cx lesion is 70% stenosed.  Mid RCA lesion is 10% stenosed.  Dist RCA lesion is 99% stenosed.  73 year old male with known hypertension hyperlipidemia diabetes and previous PCI and stent placement in the past having acute non-ST elevation myocardial infarction and chest discomfort LV showing inferior and posterior lateral hypokinesis and akinesis with ejection fraction of 40% Patent stent of left anterior descending artery with residual 25% stenosis Patent stent to proximal right coronary artery Continued 60 and 70% stenosis of bifurcation of circumflex to obtuse marginal 1 unchanged from before Worsening stenosis of distal right coronary artery to 99% as the culprit artery Plan PCI and stent placement of distal right coronary artery Dual antiplatelet therapy High intensity cholesterol therapy Hypertension control as before Cardiac rehabilitation   CARDIAC CATHETERIZATION  Result Date: 06/13/2019  Mid LAD lesion is 15% stenosed.  1st Diag lesion is 90% stenosed.  Ost Cx to Prox Cx lesion is 25% stenosed.  Mid Cx to Dist Cx lesion is 60% stenosed.  Prox Cx to Mid Cx lesion is 70% stenosed.  Mid RCA lesion is 10% stenosed.  Dist RCA lesion is 99% stenosed.  A drug-eluting stent was successfully placed using a STENT RESOLUTE ONYX 2.5X15.  Post intervention, there is a 0% residual stenosis.  1.  Non-ST elevation myocardial infarction 2.  High-grade 95% stenosis distal RCA likely culprit lesion 3.  Successful PCI  with DES distal RCA     Assessment and Recommendation  73 y.o. male with known coronary artery disease hypertension hyperlipidemia diabetes on appropriate medication management with non-ST elevation myocardial infarction and cardiac catheterization showing culprit new stenosis of distal right coronary artery Now with PCI and stent placement of right coronary artery without complication with significant improvements of symptoms 1.  No further cardiac interventions necessary at this time 2.  Dual antiplatelet therapy 3.  Continuation of medication management for further risk reduction including diabetes hypertension and lipid control 4.  High intensity cholesterol therapy 5.  Begin cardiac rehabilitation 6.  Okay for discharged home today with follow-up next week for further adjustments of medications  Signed, Serafina Royals M.D. FACC

## 2019-08-22 DIAGNOSIS — Z9889 Other specified postprocedural states: Secondary | ICD-10-CM | POA: Diagnosis not present

## 2019-08-22 DIAGNOSIS — M25512 Pain in left shoulder: Secondary | ICD-10-CM | POA: Diagnosis not present

## 2019-08-24 DIAGNOSIS — Z9889 Other specified postprocedural states: Secondary | ICD-10-CM | POA: Diagnosis not present

## 2019-08-24 DIAGNOSIS — M25512 Pain in left shoulder: Secondary | ICD-10-CM | POA: Diagnosis not present

## 2019-08-29 DIAGNOSIS — Z9889 Other specified postprocedural states: Secondary | ICD-10-CM | POA: Diagnosis not present

## 2019-08-29 DIAGNOSIS — M25512 Pain in left shoulder: Secondary | ICD-10-CM | POA: Diagnosis not present

## 2019-08-31 DIAGNOSIS — I2510 Atherosclerotic heart disease of native coronary artery without angina pectoris: Secondary | ICD-10-CM | POA: Diagnosis not present

## 2019-08-31 DIAGNOSIS — Z79899 Other long term (current) drug therapy: Secondary | ICD-10-CM | POA: Diagnosis not present

## 2019-08-31 DIAGNOSIS — N183 Chronic kidney disease, stage 3 unspecified: Secondary | ICD-10-CM | POA: Diagnosis not present

## 2019-08-31 DIAGNOSIS — E119 Type 2 diabetes mellitus without complications: Secondary | ICD-10-CM | POA: Diagnosis not present

## 2019-08-31 DIAGNOSIS — M545 Low back pain: Secondary | ICD-10-CM | POA: Diagnosis not present

## 2019-08-31 DIAGNOSIS — Z125 Encounter for screening for malignant neoplasm of prostate: Secondary | ICD-10-CM | POA: Diagnosis not present

## 2019-08-31 DIAGNOSIS — I10 Essential (primary) hypertension: Secondary | ICD-10-CM | POA: Diagnosis not present

## 2019-08-31 DIAGNOSIS — Z1211 Encounter for screening for malignant neoplasm of colon: Secondary | ICD-10-CM | POA: Diagnosis not present

## 2019-08-31 DIAGNOSIS — M109 Gout, unspecified: Secondary | ICD-10-CM | POA: Diagnosis not present

## 2019-08-31 DIAGNOSIS — Z6833 Body mass index (BMI) 33.0-33.9, adult: Secondary | ICD-10-CM | POA: Diagnosis not present

## 2019-09-01 ENCOUNTER — Other Ambulatory Visit: Payer: Self-pay | Admitting: Surgery

## 2019-09-01 DIAGNOSIS — M7582 Other shoulder lesions, left shoulder: Secondary | ICD-10-CM | POA: Diagnosis not present

## 2019-09-01 DIAGNOSIS — Z9889 Other specified postprocedural states: Secondary | ICD-10-CM | POA: Diagnosis not present

## 2019-09-01 DIAGNOSIS — M24112 Other articular cartilage disorders, left shoulder: Secondary | ICD-10-CM

## 2019-09-01 DIAGNOSIS — S46012D Strain of muscle(s) and tendon(s) of the rotator cuff of left shoulder, subsequent encounter: Secondary | ICD-10-CM

## 2019-09-08 DIAGNOSIS — Z1212 Encounter for screening for malignant neoplasm of rectum: Secondary | ICD-10-CM | POA: Diagnosis not present

## 2019-09-08 DIAGNOSIS — Z1211 Encounter for screening for malignant neoplasm of colon: Secondary | ICD-10-CM | POA: Diagnosis not present

## 2019-09-12 ENCOUNTER — Other Ambulatory Visit: Payer: Self-pay

## 2019-09-12 ENCOUNTER — Ambulatory Visit
Admission: RE | Admit: 2019-09-12 | Discharge: 2019-09-12 | Disposition: A | Discharge status: Home / Self Care | Payer: Medicare HMO | Source: Ambulatory Visit | Attending: Surgery | Admitting: Surgery

## 2019-09-12 DIAGNOSIS — M24112 Other articular cartilage disorders, left shoulder: Secondary | ICD-10-CM | POA: Diagnosis not present

## 2019-09-12 DIAGNOSIS — S46012A Strain of muscle(s) and tendon(s) of the rotator cuff of left shoulder, initial encounter: Secondary | ICD-10-CM | POA: Diagnosis not present

## 2019-09-12 DIAGNOSIS — M7582 Other shoulder lesions, left shoulder: Secondary | ICD-10-CM | POA: Insufficient documentation

## 2019-09-12 DIAGNOSIS — S46012D Strain of muscle(s) and tendon(s) of the rotator cuff of left shoulder, subsequent encounter: Secondary | ICD-10-CM | POA: Diagnosis not present

## 2019-09-12 DIAGNOSIS — M75102 Unspecified rotator cuff tear or rupture of left shoulder, not specified as traumatic: Secondary | ICD-10-CM | POA: Diagnosis not present

## 2019-09-15 DIAGNOSIS — M7582 Other shoulder lesions, left shoulder: Secondary | ICD-10-CM | POA: Diagnosis not present

## 2019-09-15 DIAGNOSIS — M24112 Other articular cartilage disorders, left shoulder: Secondary | ICD-10-CM | POA: Diagnosis not present

## 2019-09-15 DIAGNOSIS — S46012D Strain of muscle(s) and tendon(s) of the rotator cuff of left shoulder, subsequent encounter: Secondary | ICD-10-CM | POA: Diagnosis not present

## 2019-11-23 IMAGING — MR MR SHOULDER*R* W/O CM
5 series · 40 of 40 positions shown · non-contrast
Comparison: None.

CLINICAL DATA: Right shoulder pain for 8 months with limited range
of motion.

EXAM:
MRI OF THE RIGHT SHOULDER WITHOUT CONTRAST
TECHNIQUE: Multiplanar, multisequence MR imaging of the shoulder was performed.
No intravenous contrast was administered.

[Series 3: T2 fat-sat · axial · 4.0mm · 0.47mm/px · z∈[+10,+117]mm · 10 of 27 slices shown (1 of 3)]
[im 1/27]
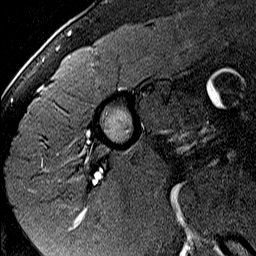
[im 3/27]
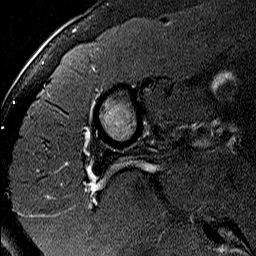
[im 6/27]
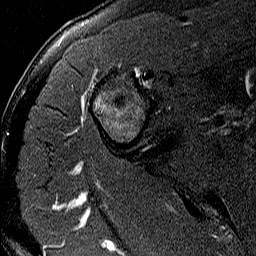
[im 9/27]
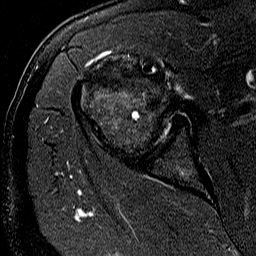
[im 12/27]
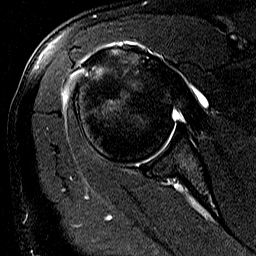
[im 15/27]
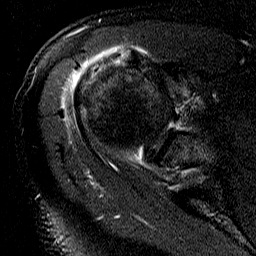
[im 18/27]
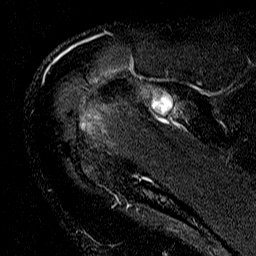
[im 21/27]
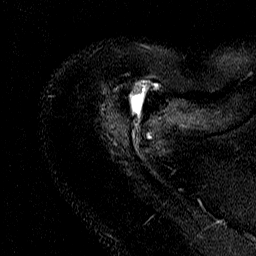
[im 24/27]
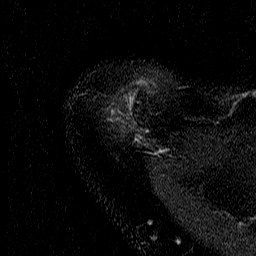
[im 27/27]
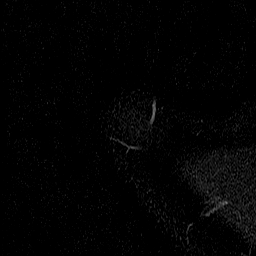

[Series 4: T2 fat-sat · oblique · 4.0mm · 0.62mm/px · 7 of 19 slices shown (2 of 3)]
[im 1/19]
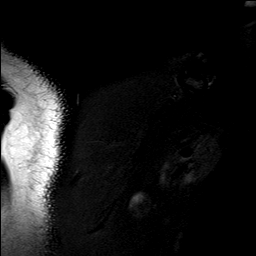
[im 4/19]
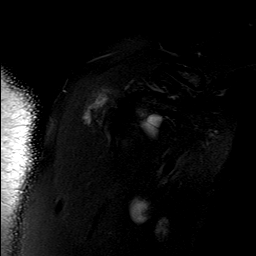
[im 7/19]
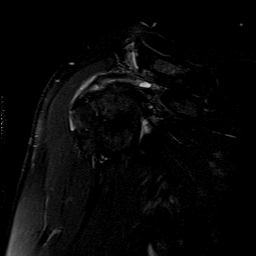
[im 10/19]
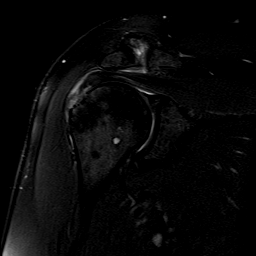
[im 13/19]
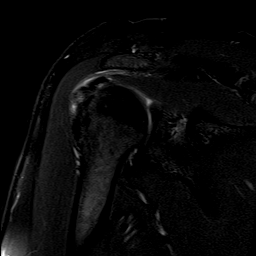
[im 16/19]
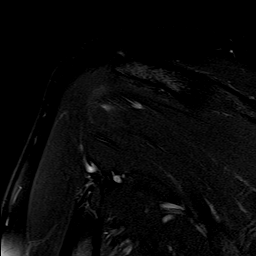
[im 19/19]
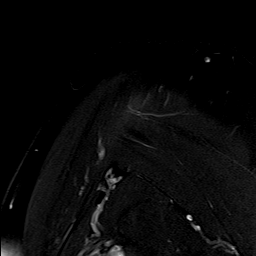

[Series 5: PD · oblique · 4.0mm · 0.62mm/px · 7 of 19 slices shown]
[im 1/19]
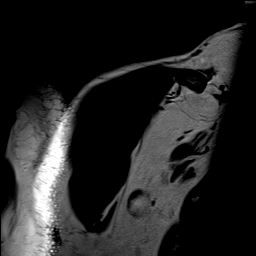
[im 4/19]
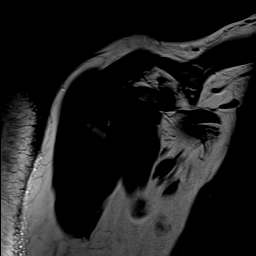
[im 7/19]
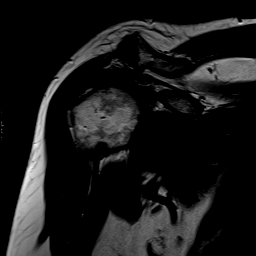
[im 10/19]
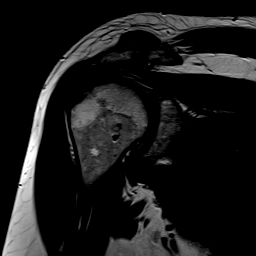
[im 13/19]
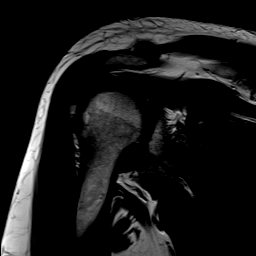
[im 16/19]
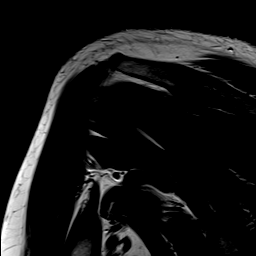
[im 19/19]
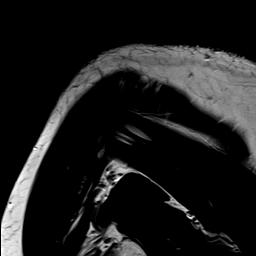

[Series 6: T1 · oblique · 4.0mm · 0.62mm/px · 8 of 22 slices shown]
[im 1/22]
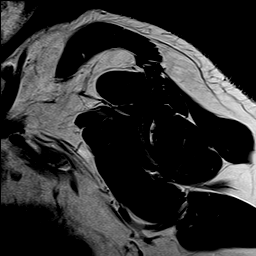
[im 4/22]
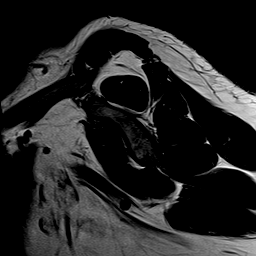
[im 7/22]
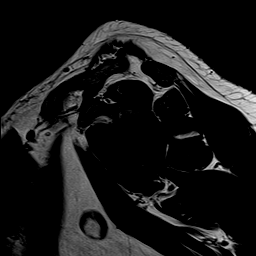
[im 10/22]
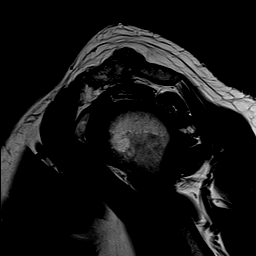
[im 13/22]
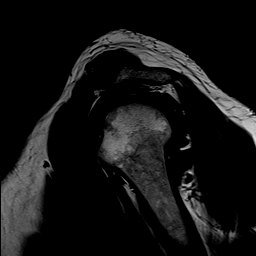
[im 16/22]
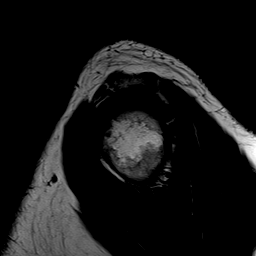
[im 19/22]
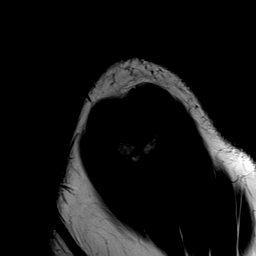
[im 22/22]
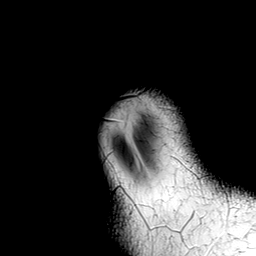

[Series 7: T2 fat-sat · oblique · 4.0mm · 0.62mm/px · 8 of 22 slices shown (3 of 3)]
[im 1/22]
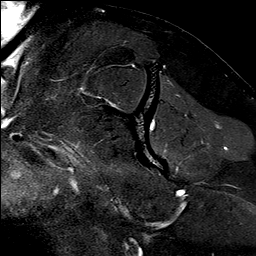
[im 4/22]
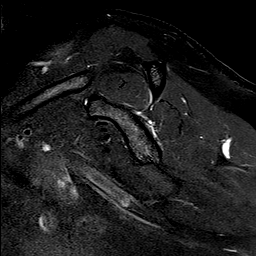
[im 7/22]
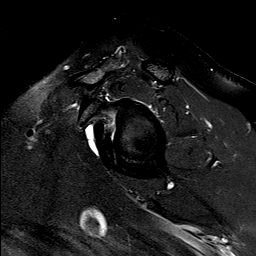
[im 10/22]
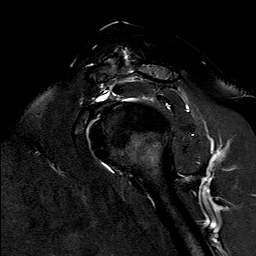
[im 13/22]
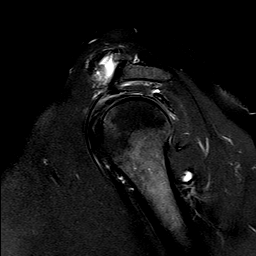
[im 16/22]
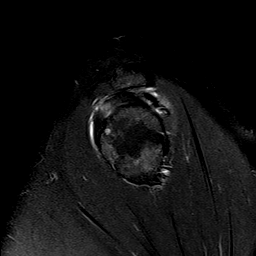
[im 19/22]
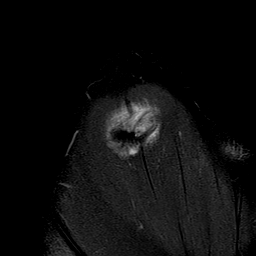
[im 22/22]
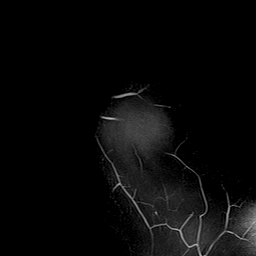

[40 of 40 positions shown; findings below may reference images not displayed]

FINDINGS: Rotator cuff: Fairly extensive articular surface tear involving the
supraspinatus tendon with laminar retraction of the articular fibers
up to 15 mm. More posteriorly there is a full-thickness retracted
tear with 11 mm of retraction.

Moderate infraspinatus tendinopathy with interstitial tears distally
near its attachment site.

The subscapularis tendon is intact.

Muscles:  Normal

Biceps long head: Intact. Mild tendinopathy involving the
intra-articular portion.

Acromioclavicular Joint: Advanced degenerative changes with moderate
fluid in the joint and significant spurring. Type 2-3 acromion. No
lateral downsloping or undersurface spurring.

Glenohumeral Joint: Moderate degenerative changes but no joint
effusion or synovitis.

Labrum:  No definite labral tears.

Bones:  No acute bony findings.

Other: Expected fluid in the subacromial/subdeltoid bursa.
IMPRESSION: 1. Articular surface tears and small full-thickness retracted tear
involving the supraspinatus tendon.
2. Infraspinatus tendinopathy/tendinosis with interstitial tears but
no full-thickness tear.
3. Intact long head biceps tendon and glenoid labrum.
4. Advanced AC joint degenerative changes and type 2-3 acromion
possibly contributing to bony impingement.
5. Moderate glenohumeral joint degenerative changes but no joint
effusion or synovitis.

## 2019-12-04 DIAGNOSIS — Z79899 Other long term (current) drug therapy: Secondary | ICD-10-CM | POA: Diagnosis not present

## 2019-12-04 DIAGNOSIS — E119 Type 2 diabetes mellitus without complications: Secondary | ICD-10-CM | POA: Diagnosis not present

## 2019-12-04 DIAGNOSIS — Z6833 Body mass index (BMI) 33.0-33.9, adult: Secondary | ICD-10-CM | POA: Diagnosis not present

## 2019-12-04 DIAGNOSIS — I10 Essential (primary) hypertension: Secondary | ICD-10-CM | POA: Diagnosis not present

## 2019-12-04 DIAGNOSIS — I2510 Atherosclerotic heart disease of native coronary artery without angina pectoris: Secondary | ICD-10-CM | POA: Diagnosis not present

## 2019-12-04 DIAGNOSIS — M545 Low back pain: Secondary | ICD-10-CM | POA: Diagnosis not present

## 2019-12-04 DIAGNOSIS — M109 Gout, unspecified: Secondary | ICD-10-CM | POA: Diagnosis not present

## 2019-12-04 DIAGNOSIS — N183 Chronic kidney disease, stage 3 unspecified: Secondary | ICD-10-CM | POA: Diagnosis not present

## 2019-12-21 DIAGNOSIS — I10 Essential (primary) hypertension: Secondary | ICD-10-CM | POA: Diagnosis not present

## 2019-12-21 DIAGNOSIS — E118 Type 2 diabetes mellitus with unspecified complications: Secondary | ICD-10-CM | POA: Diagnosis not present

## 2019-12-21 DIAGNOSIS — I255 Ischemic cardiomyopathy: Secondary | ICD-10-CM | POA: Diagnosis not present

## 2019-12-21 DIAGNOSIS — I6523 Occlusion and stenosis of bilateral carotid arteries: Secondary | ICD-10-CM | POA: Diagnosis not present

## 2019-12-21 DIAGNOSIS — E782 Mixed hyperlipidemia: Secondary | ICD-10-CM | POA: Diagnosis not present

## 2019-12-21 DIAGNOSIS — I252 Old myocardial infarction: Secondary | ICD-10-CM | POA: Diagnosis not present

## 2019-12-21 DIAGNOSIS — I2510 Atherosclerotic heart disease of native coronary artery without angina pectoris: Secondary | ICD-10-CM | POA: Diagnosis not present

## 2020-01-22 DIAGNOSIS — M7581 Other shoulder lesions, right shoulder: Secondary | ICD-10-CM | POA: Diagnosis not present

## 2020-01-22 DIAGNOSIS — S46012D Strain of muscle(s) and tendon(s) of the rotator cuff of left shoulder, subsequent encounter: Secondary | ICD-10-CM | POA: Diagnosis not present

## 2020-01-22 DIAGNOSIS — M24112 Other articular cartilage disorders, left shoulder: Secondary | ICD-10-CM | POA: Diagnosis not present

## 2020-01-22 DIAGNOSIS — M7582 Other shoulder lesions, left shoulder: Secondary | ICD-10-CM | POA: Diagnosis not present

## 2020-01-22 DIAGNOSIS — M7522 Bicipital tendinitis, left shoulder: Secondary | ICD-10-CM | POA: Diagnosis not present

## 2020-01-22 DIAGNOSIS — M7521 Bicipital tendinitis, right shoulder: Secondary | ICD-10-CM | POA: Diagnosis not present

## 2020-01-29 DIAGNOSIS — I2510 Atherosclerotic heart disease of native coronary artery without angina pectoris: Secondary | ICD-10-CM | POA: Diagnosis not present

## 2020-01-29 DIAGNOSIS — I252 Old myocardial infarction: Secondary | ICD-10-CM | POA: Diagnosis not present

## 2020-01-29 DIAGNOSIS — I255 Ischemic cardiomyopathy: Secondary | ICD-10-CM | POA: Diagnosis not present

## 2020-02-01 DIAGNOSIS — E785 Hyperlipidemia, unspecified: Secondary | ICD-10-CM | POA: Diagnosis not present

## 2020-02-01 DIAGNOSIS — Z9181 History of falling: Secondary | ICD-10-CM | POA: Diagnosis not present

## 2020-02-01 DIAGNOSIS — Z6833 Body mass index (BMI) 33.0-33.9, adult: Secondary | ICD-10-CM | POA: Diagnosis not present

## 2020-02-01 DIAGNOSIS — Z1331 Encounter for screening for depression: Secondary | ICD-10-CM | POA: Diagnosis not present

## 2020-02-01 DIAGNOSIS — Z0000 Encounter for general adult medical examination without abnormal findings: Secondary | ICD-10-CM | POA: Diagnosis not present

## 2020-02-28 DIAGNOSIS — I10 Essential (primary) hypertension: Secondary | ICD-10-CM | POA: Diagnosis not present

## 2020-02-28 DIAGNOSIS — I2510 Atherosclerotic heart disease of native coronary artery without angina pectoris: Secondary | ICD-10-CM | POA: Diagnosis not present

## 2020-02-28 DIAGNOSIS — I6523 Occlusion and stenosis of bilateral carotid arteries: Secondary | ICD-10-CM | POA: Diagnosis not present

## 2020-02-28 DIAGNOSIS — E782 Mixed hyperlipidemia: Secondary | ICD-10-CM | POA: Diagnosis not present

## 2020-03-05 DIAGNOSIS — Z23 Encounter for immunization: Secondary | ICD-10-CM | POA: Diagnosis not present

## 2020-03-05 DIAGNOSIS — M109 Gout, unspecified: Secondary | ICD-10-CM | POA: Diagnosis not present

## 2020-03-05 DIAGNOSIS — M545 Low back pain: Secondary | ICD-10-CM | POA: Diagnosis not present

## 2020-03-05 DIAGNOSIS — Z20822 Contact with and (suspected) exposure to covid-19: Secondary | ICD-10-CM | POA: Diagnosis not present

## 2020-03-05 DIAGNOSIS — N183 Chronic kidney disease, stage 3 unspecified: Secondary | ICD-10-CM | POA: Diagnosis not present

## 2020-03-05 DIAGNOSIS — I10 Essential (primary) hypertension: Secondary | ICD-10-CM | POA: Diagnosis not present

## 2020-03-05 DIAGNOSIS — I2510 Atherosclerotic heart disease of native coronary artery without angina pectoris: Secondary | ICD-10-CM | POA: Diagnosis not present

## 2020-03-05 DIAGNOSIS — E119 Type 2 diabetes mellitus without complications: Secondary | ICD-10-CM | POA: Diagnosis not present

## 2020-05-03 DIAGNOSIS — N183 Chronic kidney disease, stage 3 unspecified: Secondary | ICD-10-CM | POA: Diagnosis not present

## 2020-05-03 DIAGNOSIS — I2510 Atherosclerotic heart disease of native coronary artery without angina pectoris: Secondary | ICD-10-CM | POA: Diagnosis not present

## 2020-05-03 DIAGNOSIS — E785 Hyperlipidemia, unspecified: Secondary | ICD-10-CM | POA: Diagnosis not present

## 2020-05-03 DIAGNOSIS — I509 Heart failure, unspecified: Secondary | ICD-10-CM | POA: Diagnosis not present

## 2020-05-03 DIAGNOSIS — K264 Chronic or unspecified duodenal ulcer with hemorrhage: Secondary | ICD-10-CM | POA: Diagnosis not present

## 2020-05-03 DIAGNOSIS — K921 Melena: Secondary | ICD-10-CM | POA: Diagnosis not present

## 2020-05-03 DIAGNOSIS — D62 Acute posthemorrhagic anemia: Secondary | ICD-10-CM | POA: Diagnosis not present

## 2020-05-03 DIAGNOSIS — Z20822 Contact with and (suspected) exposure to covid-19: Secondary | ICD-10-CM | POA: Diagnosis not present

## 2020-05-03 DIAGNOSIS — K922 Gastrointestinal hemorrhage, unspecified: Secondary | ICD-10-CM | POA: Diagnosis not present

## 2020-05-03 DIAGNOSIS — R42 Dizziness and giddiness: Secondary | ICD-10-CM | POA: Diagnosis not present

## 2020-05-03 DIAGNOSIS — I252 Old myocardial infarction: Secondary | ICD-10-CM | POA: Diagnosis not present

## 2020-05-03 DIAGNOSIS — E1122 Type 2 diabetes mellitus with diabetic chronic kidney disease: Secondary | ICD-10-CM | POA: Diagnosis not present

## 2020-05-03 DIAGNOSIS — D649 Anemia, unspecified: Secondary | ICD-10-CM | POA: Diagnosis not present

## 2020-05-03 DIAGNOSIS — M791 Myalgia, unspecified site: Secondary | ICD-10-CM | POA: Diagnosis not present

## 2020-05-03 DIAGNOSIS — I130 Hypertensive heart and chronic kidney disease with heart failure and stage 1 through stage 4 chronic kidney disease, or unspecified chronic kidney disease: Secondary | ICD-10-CM | POA: Diagnosis not present

## 2020-05-06 DIAGNOSIS — I130 Hypertensive heart and chronic kidney disease with heart failure and stage 1 through stage 4 chronic kidney disease, or unspecified chronic kidney disease: Secondary | ICD-10-CM | POA: Diagnosis not present

## 2020-05-06 DIAGNOSIS — E1122 Type 2 diabetes mellitus with diabetic chronic kidney disease: Secondary | ICD-10-CM | POA: Diagnosis not present

## 2020-05-06 DIAGNOSIS — K921 Melena: Secondary | ICD-10-CM | POA: Diagnosis not present

## 2020-05-06 DIAGNOSIS — Z9861 Coronary angioplasty status: Secondary | ICD-10-CM | POA: Diagnosis not present

## 2020-05-06 DIAGNOSIS — K922 Gastrointestinal hemorrhage, unspecified: Secondary | ICD-10-CM | POA: Diagnosis not present

## 2020-05-06 DIAGNOSIS — N183 Chronic kidney disease, stage 3 unspecified: Secondary | ICD-10-CM | POA: Diagnosis not present

## 2020-05-06 DIAGNOSIS — E785 Hyperlipidemia, unspecified: Secondary | ICD-10-CM | POA: Diagnosis not present

## 2020-05-06 DIAGNOSIS — I509 Heart failure, unspecified: Secondary | ICD-10-CM | POA: Diagnosis not present

## 2020-05-06 DIAGNOSIS — K2930 Chronic superficial gastritis without bleeding: Secondary | ICD-10-CM | POA: Diagnosis not present

## 2020-05-06 DIAGNOSIS — I252 Old myocardial infarction: Secondary | ICD-10-CM | POA: Diagnosis not present

## 2020-05-06 DIAGNOSIS — K269 Duodenal ulcer, unspecified as acute or chronic, without hemorrhage or perforation: Secondary | ICD-10-CM | POA: Insufficient documentation

## 2020-05-06 DIAGNOSIS — Z79899 Other long term (current) drug therapy: Secondary | ICD-10-CM | POA: Diagnosis not present

## 2020-05-06 DIAGNOSIS — I2510 Atherosclerotic heart disease of native coronary artery without angina pectoris: Secondary | ICD-10-CM | POA: Diagnosis not present

## 2020-05-06 DIAGNOSIS — K2980 Duodenitis without bleeding: Secondary | ICD-10-CM | POA: Diagnosis not present

## 2020-05-06 DIAGNOSIS — K2970 Gastritis, unspecified, without bleeding: Secondary | ICD-10-CM | POA: Diagnosis not present

## 2020-05-06 DIAGNOSIS — D62 Acute posthemorrhagic anemia: Secondary | ICD-10-CM | POA: Diagnosis not present

## 2020-05-06 DIAGNOSIS — K264 Chronic or unspecified duodenal ulcer with hemorrhage: Secondary | ICD-10-CM | POA: Diagnosis not present

## 2020-05-06 DIAGNOSIS — D649 Anemia, unspecified: Secondary | ICD-10-CM | POA: Diagnosis not present

## 2020-05-07 DIAGNOSIS — I130 Hypertensive heart and chronic kidney disease with heart failure and stage 1 through stage 4 chronic kidney disease, or unspecified chronic kidney disease: Secondary | ICD-10-CM | POA: Diagnosis not present

## 2020-05-07 DIAGNOSIS — K922 Gastrointestinal hemorrhage, unspecified: Secondary | ICD-10-CM | POA: Diagnosis not present

## 2020-05-07 DIAGNOSIS — K921 Melena: Secondary | ICD-10-CM | POA: Diagnosis not present

## 2020-05-07 DIAGNOSIS — I5030 Unspecified diastolic (congestive) heart failure: Secondary | ICD-10-CM | POA: Diagnosis not present

## 2020-05-07 DIAGNOSIS — E7800 Pure hypercholesterolemia, unspecified: Secondary | ICD-10-CM | POA: Diagnosis not present

## 2020-05-07 DIAGNOSIS — E1122 Type 2 diabetes mellitus with diabetic chronic kidney disease: Secondary | ICD-10-CM | POA: Diagnosis not present

## 2020-05-07 DIAGNOSIS — Z79899 Other long term (current) drug therapy: Secondary | ICD-10-CM | POA: Diagnosis not present

## 2020-05-07 DIAGNOSIS — Z955 Presence of coronary angioplasty implant and graft: Secondary | ICD-10-CM | POA: Diagnosis not present

## 2020-05-07 DIAGNOSIS — N183 Chronic kidney disease, stage 3 unspecified: Secondary | ICD-10-CM | POA: Diagnosis not present

## 2020-05-07 DIAGNOSIS — I2510 Atherosclerotic heart disease of native coronary artery without angina pectoris: Secondary | ICD-10-CM | POA: Diagnosis not present

## 2020-05-13 DIAGNOSIS — I2510 Atherosclerotic heart disease of native coronary artery without angina pectoris: Secondary | ICD-10-CM | POA: Diagnosis not present

## 2020-05-13 DIAGNOSIS — K922 Gastrointestinal hemorrhage, unspecified: Secondary | ICD-10-CM | POA: Diagnosis not present

## 2020-05-13 DIAGNOSIS — Z79899 Other long term (current) drug therapy: Secondary | ICD-10-CM | POA: Diagnosis not present

## 2020-05-13 DIAGNOSIS — N183 Chronic kidney disease, stage 3 unspecified: Secondary | ICD-10-CM | POA: Diagnosis not present

## 2020-05-13 DIAGNOSIS — K269 Duodenal ulcer, unspecified as acute or chronic, without hemorrhage or perforation: Secondary | ICD-10-CM | POA: Diagnosis not present

## 2020-05-13 DIAGNOSIS — Z6833 Body mass index (BMI) 33.0-33.9, adult: Secondary | ICD-10-CM | POA: Diagnosis not present

## 2020-05-13 DIAGNOSIS — E119 Type 2 diabetes mellitus without complications: Secondary | ICD-10-CM | POA: Diagnosis not present

## 2020-06-10 DIAGNOSIS — E118 Type 2 diabetes mellitus with unspecified complications: Secondary | ICD-10-CM | POA: Diagnosis not present

## 2020-06-10 DIAGNOSIS — I2510 Atherosclerotic heart disease of native coronary artery without angina pectoris: Secondary | ICD-10-CM | POA: Diagnosis not present

## 2020-06-10 DIAGNOSIS — I255 Ischemic cardiomyopathy: Secondary | ICD-10-CM | POA: Diagnosis not present

## 2020-06-10 DIAGNOSIS — I6523 Occlusion and stenosis of bilateral carotid arteries: Secondary | ICD-10-CM | POA: Diagnosis not present

## 2020-06-10 DIAGNOSIS — E782 Mixed hyperlipidemia: Secondary | ICD-10-CM | POA: Diagnosis not present

## 2020-06-10 DIAGNOSIS — R0600 Dyspnea, unspecified: Secondary | ICD-10-CM | POA: Diagnosis not present

## 2020-06-30 DIAGNOSIS — Z139 Encounter for screening, unspecified: Secondary | ICD-10-CM | POA: Diagnosis not present

## 2020-07-02 DIAGNOSIS — I2510 Atherosclerotic heart disease of native coronary artery without angina pectoris: Secondary | ICD-10-CM | POA: Diagnosis not present

## 2020-07-02 DIAGNOSIS — E785 Hyperlipidemia, unspecified: Secondary | ICD-10-CM | POA: Diagnosis not present

## 2020-07-02 DIAGNOSIS — E7800 Pure hypercholesterolemia, unspecified: Secondary | ICD-10-CM | POA: Diagnosis not present

## 2020-07-02 DIAGNOSIS — N189 Chronic kidney disease, unspecified: Secondary | ICD-10-CM | POA: Diagnosis not present

## 2020-07-02 DIAGNOSIS — K2970 Gastritis, unspecified, without bleeding: Secondary | ICD-10-CM | POA: Diagnosis not present

## 2020-07-02 DIAGNOSIS — E1122 Type 2 diabetes mellitus with diabetic chronic kidney disease: Secondary | ICD-10-CM | POA: Diagnosis not present

## 2020-07-02 DIAGNOSIS — I252 Old myocardial infarction: Secondary | ICD-10-CM | POA: Diagnosis not present

## 2020-07-02 DIAGNOSIS — K219 Gastro-esophageal reflux disease without esophagitis: Secondary | ICD-10-CM | POA: Diagnosis not present

## 2020-07-02 DIAGNOSIS — I129 Hypertensive chronic kidney disease with stage 1 through stage 4 chronic kidney disease, or unspecified chronic kidney disease: Secondary | ICD-10-CM | POA: Diagnosis not present

## 2020-07-02 DIAGNOSIS — K319 Disease of stomach and duodenum, unspecified: Secondary | ICD-10-CM | POA: Diagnosis not present

## 2020-07-02 DIAGNOSIS — K269 Duodenal ulcer, unspecified as acute or chronic, without hemorrhage or perforation: Secondary | ICD-10-CM | POA: Diagnosis not present

## 2020-07-22 DIAGNOSIS — I6523 Occlusion and stenosis of bilateral carotid arteries: Secondary | ICD-10-CM | POA: Diagnosis not present

## 2020-07-22 DIAGNOSIS — I2510 Atherosclerotic heart disease of native coronary artery without angina pectoris: Secondary | ICD-10-CM | POA: Diagnosis not present

## 2020-07-22 DIAGNOSIS — I255 Ischemic cardiomyopathy: Secondary | ICD-10-CM | POA: Diagnosis not present

## 2020-07-22 DIAGNOSIS — R0600 Dyspnea, unspecified: Secondary | ICD-10-CM | POA: Diagnosis not present

## 2020-07-22 DIAGNOSIS — E785 Hyperlipidemia, unspecified: Secondary | ICD-10-CM | POA: Diagnosis not present

## 2020-07-26 DIAGNOSIS — I25118 Atherosclerotic heart disease of native coronary artery with other forms of angina pectoris: Secondary | ICD-10-CM | POA: Diagnosis not present

## 2020-07-26 DIAGNOSIS — E782 Mixed hyperlipidemia: Secondary | ICD-10-CM | POA: Diagnosis not present

## 2020-07-26 DIAGNOSIS — I10 Essential (primary) hypertension: Secondary | ICD-10-CM | POA: Diagnosis not present

## 2020-08-23 DIAGNOSIS — Z79899 Other long term (current) drug therapy: Secondary | ICD-10-CM | POA: Diagnosis not present

## 2020-08-23 DIAGNOSIS — Z20822 Contact with and (suspected) exposure to covid-19: Secondary | ICD-10-CM | POA: Diagnosis not present

## 2020-08-23 DIAGNOSIS — M791 Myalgia, unspecified site: Secondary | ICD-10-CM | POA: Diagnosis not present

## 2020-08-23 DIAGNOSIS — M109 Gout, unspecified: Secondary | ICD-10-CM | POA: Diagnosis not present

## 2020-08-23 DIAGNOSIS — N183 Chronic kidney disease, stage 3 unspecified: Secondary | ICD-10-CM | POA: Diagnosis not present

## 2020-08-23 DIAGNOSIS — M545 Low back pain, unspecified: Secondary | ICD-10-CM | POA: Diagnosis not present

## 2020-08-23 DIAGNOSIS — I2510 Atherosclerotic heart disease of native coronary artery without angina pectoris: Secondary | ICD-10-CM | POA: Diagnosis not present

## 2020-08-23 DIAGNOSIS — E119 Type 2 diabetes mellitus without complications: Secondary | ICD-10-CM | POA: Diagnosis not present

## 2020-09-17 DIAGNOSIS — I2510 Atherosclerotic heart disease of native coronary artery without angina pectoris: Secondary | ICD-10-CM | POA: Diagnosis not present

## 2020-09-17 DIAGNOSIS — N183 Chronic kidney disease, stage 3 unspecified: Secondary | ICD-10-CM | POA: Diagnosis not present

## 2020-09-17 DIAGNOSIS — E875 Hyperkalemia: Secondary | ICD-10-CM | POA: Diagnosis not present

## 2020-09-17 DIAGNOSIS — Z6834 Body mass index (BMI) 34.0-34.9, adult: Secondary | ICD-10-CM | POA: Diagnosis not present

## 2020-09-17 DIAGNOSIS — Z139 Encounter for screening, unspecified: Secondary | ICD-10-CM | POA: Diagnosis not present

## 2020-09-17 DIAGNOSIS — I10 Essential (primary) hypertension: Secondary | ICD-10-CM | POA: Diagnosis not present

## 2020-09-17 DIAGNOSIS — E119 Type 2 diabetes mellitus without complications: Secondary | ICD-10-CM | POA: Diagnosis not present

## 2020-10-02 DIAGNOSIS — E875 Hyperkalemia: Secondary | ICD-10-CM | POA: Diagnosis not present

## 2020-11-20 DIAGNOSIS — Z8616 Personal history of COVID-19: Secondary | ICD-10-CM

## 2020-11-20 HISTORY — DX: Personal history of COVID-19: Z86.16

## 2020-11-26 DIAGNOSIS — E119 Type 2 diabetes mellitus without complications: Secondary | ICD-10-CM | POA: Diagnosis not present

## 2020-11-29 DIAGNOSIS — I2510 Atherosclerotic heart disease of native coronary artery without angina pectoris: Secondary | ICD-10-CM | POA: Diagnosis not present

## 2020-11-29 DIAGNOSIS — E119 Type 2 diabetes mellitus without complications: Secondary | ICD-10-CM | POA: Diagnosis not present

## 2020-11-29 DIAGNOSIS — I10 Essential (primary) hypertension: Secondary | ICD-10-CM | POA: Diagnosis not present

## 2020-11-29 DIAGNOSIS — M109 Gout, unspecified: Secondary | ICD-10-CM | POA: Diagnosis not present

## 2020-11-29 DIAGNOSIS — Z125 Encounter for screening for malignant neoplasm of prostate: Secondary | ICD-10-CM | POA: Diagnosis not present

## 2020-11-29 DIAGNOSIS — N183 Chronic kidney disease, stage 3 unspecified: Secondary | ICD-10-CM | POA: Diagnosis not present

## 2020-11-29 DIAGNOSIS — U071 COVID-19: Secondary | ICD-10-CM | POA: Diagnosis not present

## 2020-11-29 DIAGNOSIS — M545 Low back pain, unspecified: Secondary | ICD-10-CM | POA: Diagnosis not present

## 2020-11-29 DIAGNOSIS — Z6834 Body mass index (BMI) 34.0-34.9, adult: Secondary | ICD-10-CM | POA: Diagnosis not present

## 2020-12-16 DIAGNOSIS — M109 Gout, unspecified: Secondary | ICD-10-CM | POA: Diagnosis not present

## 2020-12-16 DIAGNOSIS — E119 Type 2 diabetes mellitus without complications: Secondary | ICD-10-CM | POA: Diagnosis not present

## 2020-12-16 DIAGNOSIS — Z125 Encounter for screening for malignant neoplasm of prostate: Secondary | ICD-10-CM | POA: Diagnosis not present

## 2020-12-16 DIAGNOSIS — U071 COVID-19: Secondary | ICD-10-CM | POA: Diagnosis not present

## 2020-12-16 DIAGNOSIS — N183 Chronic kidney disease, stage 3 unspecified: Secondary | ICD-10-CM | POA: Diagnosis not present

## 2020-12-16 DIAGNOSIS — I2510 Atherosclerotic heart disease of native coronary artery without angina pectoris: Secondary | ICD-10-CM | POA: Diagnosis not present

## 2020-12-16 DIAGNOSIS — I10 Essential (primary) hypertension: Secondary | ICD-10-CM | POA: Diagnosis not present

## 2020-12-20 ENCOUNTER — Other Ambulatory Visit: Payer: Self-pay

## 2020-12-20 ENCOUNTER — Emergency Department: Payer: Medicare HMO

## 2020-12-20 ENCOUNTER — Inpatient Hospital Stay
Admission: EM | Admit: 2020-12-20 | Discharge: 2020-12-21 | DRG: 247 | Disposition: A | Discharge status: Home / Self Care | Payer: Medicare HMO | Attending: Cardiology | Admitting: Cardiology

## 2020-12-20 ENCOUNTER — Encounter
Admission: EM | Disposition: A | Discharge status: Home / Self Care | Payer: Self-pay | Source: Home / Self Care | Attending: Internal Medicine

## 2020-12-20 DIAGNOSIS — Z955 Presence of coronary angioplasty implant and graft: Secondary | ICD-10-CM

## 2020-12-20 DIAGNOSIS — I252 Old myocardial infarction: Secondary | ICD-10-CM

## 2020-12-20 DIAGNOSIS — I2510 Atherosclerotic heart disease of native coronary artery without angina pectoris: Secondary | ICD-10-CM | POA: Diagnosis present

## 2020-12-20 DIAGNOSIS — E1122 Type 2 diabetes mellitus with diabetic chronic kidney disease: Secondary | ICD-10-CM | POA: Diagnosis present

## 2020-12-20 DIAGNOSIS — R0789 Other chest pain: Secondary | ICD-10-CM | POA: Diagnosis not present

## 2020-12-20 DIAGNOSIS — I5022 Chronic systolic (congestive) heart failure: Secondary | ICD-10-CM | POA: Diagnosis present

## 2020-12-20 DIAGNOSIS — Z7982 Long term (current) use of aspirin: Secondary | ICD-10-CM

## 2020-12-20 DIAGNOSIS — R072 Precordial pain: Secondary | ICD-10-CM | POA: Diagnosis not present

## 2020-12-20 DIAGNOSIS — R739 Hyperglycemia, unspecified: Secondary | ICD-10-CM | POA: Diagnosis not present

## 2020-12-20 DIAGNOSIS — Z7984 Long term (current) use of oral hypoglycemic drugs: Secondary | ICD-10-CM

## 2020-12-20 DIAGNOSIS — Z87891 Personal history of nicotine dependence: Secondary | ICD-10-CM

## 2020-12-20 DIAGNOSIS — E785 Hyperlipidemia, unspecified: Secondary | ICD-10-CM | POA: Diagnosis present

## 2020-12-20 DIAGNOSIS — Z8616 Personal history of COVID-19: Secondary | ICD-10-CM

## 2020-12-20 DIAGNOSIS — Z882 Allergy status to sulfonamides status: Secondary | ICD-10-CM

## 2020-12-20 DIAGNOSIS — Z743 Need for continuous supervision: Secondary | ICD-10-CM | POA: Diagnosis not present

## 2020-12-20 DIAGNOSIS — N183 Chronic kidney disease, stage 3 unspecified: Secondary | ICD-10-CM | POA: Diagnosis present

## 2020-12-20 DIAGNOSIS — K219 Gastro-esophageal reflux disease without esophagitis: Secondary | ICD-10-CM | POA: Diagnosis present

## 2020-12-20 DIAGNOSIS — I25110 Atherosclerotic heart disease of native coronary artery with unstable angina pectoris: Secondary | ICD-10-CM | POA: Diagnosis present

## 2020-12-20 DIAGNOSIS — I214 Non-ST elevation (NSTEMI) myocardial infarction: Principal | ICD-10-CM | POA: Diagnosis present

## 2020-12-20 DIAGNOSIS — N1831 Chronic kidney disease, stage 3a: Secondary | ICD-10-CM | POA: Diagnosis not present

## 2020-12-20 DIAGNOSIS — Z7902 Long term (current) use of antithrombotics/antiplatelets: Secondary | ICD-10-CM

## 2020-12-20 DIAGNOSIS — E1129 Type 2 diabetes mellitus with other diabetic kidney complication: Secondary | ICD-10-CM

## 2020-12-20 DIAGNOSIS — R079 Chest pain, unspecified: Secondary | ICD-10-CM | POA: Diagnosis not present

## 2020-12-20 DIAGNOSIS — Z79899 Other long term (current) drug therapy: Secondary | ICD-10-CM

## 2020-12-20 DIAGNOSIS — Z20822 Contact with and (suspected) exposure to covid-19: Secondary | ICD-10-CM | POA: Diagnosis present

## 2020-12-20 DIAGNOSIS — R749 Abnormal serum enzyme level, unspecified: Secondary | ICD-10-CM | POA: Diagnosis not present

## 2020-12-20 DIAGNOSIS — I130 Hypertensive heart and chronic kidney disease with heart failure and stage 1 through stage 4 chronic kidney disease, or unspecified chronic kidney disease: Secondary | ICD-10-CM | POA: Diagnosis present

## 2020-12-20 DIAGNOSIS — I10 Essential (primary) hypertension: Secondary | ICD-10-CM | POA: Diagnosis not present

## 2020-12-20 DIAGNOSIS — E782 Mixed hyperlipidemia: Secondary | ICD-10-CM | POA: Diagnosis present

## 2020-12-20 DIAGNOSIS — E119 Type 2 diabetes mellitus without complications: Secondary | ICD-10-CM | POA: Diagnosis present

## 2020-12-20 DIAGNOSIS — Z888 Allergy status to other drugs, medicaments and biological substances status: Secondary | ICD-10-CM

## 2020-12-20 HISTORY — PX: CORONARY STENT INTERVENTION: CATH118234

## 2020-12-20 HISTORY — PX: LEFT HEART CATH AND CORONARY ANGIOGRAPHY: CATH118249

## 2020-12-20 LAB — TROPONIN I (HIGH SENSITIVITY)
Troponin I (High Sensitivity): 141 ng/L (ref <18)
Troponin I (High Sensitivity): 4261 ng/L (ref <18)
Troponin I (High Sensitivity): >24000 ng/L (ref <18)
Troponin I (High Sensitivity): >24000 ng/L (ref <18)

## 2020-12-20 LAB — RESP PANEL BY RT-PCR (FLU A&B, COVID) ARPGX2
Influenza A by PCR: NEGATIVE
Influenza B by PCR: NEGATIVE
SARS Coronavirus 2 by RT PCR: NEGATIVE

## 2020-12-20 LAB — CREATININE, SERUM
Creatinine, Ser: 1.2 mg/dL (ref 0.61–1.24)
GFR, Estimated: >60 mL/min (ref >60)

## 2020-12-20 LAB — PROTIME-INR
INR: 1 (ref 0.8–1.2)
Prothrombin Time: 13 seconds (ref 11.4–15.2)

## 2020-12-20 LAB — CBC
HCT: 40 % (ref 39.0–52.0)
HCT: 38.5 % — ABNORMAL LOW (ref 39.0–52.0)
Hemoglobin: 13.9 g/dL (ref 13.0–17.0)
Hemoglobin: 13.2 g/dL (ref 13.0–17.0)
MCH: 31.2 pg (ref 26.0–34.0)
MCH: 31 pg (ref 26.0–34.0)
MCHC: 34.8 g/dL (ref 30.0–36.0)
MCHC: 34.3 g/dL (ref 30.0–36.0)
MCV: 89.9 fL (ref 80.0–100.0)
MCV: 90.4 fL (ref 80.0–100.0)
Platelets: 166 10*3/uL (ref 150–400)
Platelets: 166 10*3/uL (ref 150–400)
RBC: 4.45 MIL/uL (ref 4.22–5.81)
RBC: 4.26 MIL/uL (ref 4.22–5.81)
RDW: 14.9 % (ref 11.5–15.5)
RDW: 15 % (ref 11.5–15.5)
WBC: 9 10*3/uL (ref 4.0–10.5)
WBC: 10.4 10*3/uL (ref 4.0–10.5)
nRBC: 0 % (ref 0.0–0.2)
nRBC: 0 % (ref 0.0–0.2)

## 2020-12-20 LAB — D-DIMER, QUANTITATIVE: D-Dimer, Quant: 0.4 ug/mL-FEU (ref 0.00–0.50)

## 2020-12-20 LAB — URINE DRUG SCREEN, QUALITATIVE (ARMC ONLY)
Amphetamines, Ur Screen: NOT DETECTED
Barbiturates, Ur Screen: NOT DETECTED
Benzodiazepine, Ur Scrn: POSITIVE — AB
Cannabinoid 50 Ng, Ur ~~LOC~~: NOT DETECTED
Cocaine Metabolite,Ur ~~LOC~~: NOT DETECTED
MDMA (Ecstasy)Ur Screen: NOT DETECTED
Methadone Scn, Ur: NOT DETECTED
Opiate, Ur Screen: POSITIVE — AB
Phencyclidine (PCP) Ur S: NOT DETECTED
Tricyclic, Ur Screen: NOT DETECTED

## 2020-12-20 LAB — BASIC METABOLIC PANEL
Anion gap: 7 (ref 5–15)
BUN: 29 mg/dL — ABNORMAL HIGH (ref 8–23)
CO2: 26 mmol/L (ref 22–32)
Calcium: 8.8 mg/dL — ABNORMAL LOW (ref 8.9–10.3)
Chloride: 102 mmol/L (ref 98–111)
Creatinine, Ser: 1.32 mg/dL — ABNORMAL HIGH (ref 0.61–1.24)
GFR, Estimated: 56 mL/min — ABNORMAL LOW (ref >60)
Glucose, Bld: 284 mg/dL — ABNORMAL HIGH (ref 70–99)
Potassium: 4.8 mmol/L (ref 3.5–5.1)
Sodium: 135 mmol/L (ref 135–145)

## 2020-12-20 LAB — GLUCOSE, CAPILLARY
Glucose-Capillary: 195 mg/dL — ABNORMAL HIGH (ref 70–99)
Glucose-Capillary: 212 mg/dL — ABNORMAL HIGH (ref 70–99)

## 2020-12-20 LAB — POCT ACTIVATED CLOTTING TIME
Activated Clotting Time: 335 seconds
Activated Clotting Time: 352 seconds

## 2020-12-20 LAB — APTT: aPTT: 35 seconds (ref 24–36)

## 2020-12-20 LAB — BRAIN NATRIURETIC PEPTIDE: B Natriuretic Peptide: 23.6 pg/mL (ref 0.0–100.0)

## 2020-12-20 LAB — CBG MONITORING, ED: Glucose-Capillary: 232 mg/dL — ABNORMAL HIGH (ref 70–99)

## 2020-12-20 SURGERY — LEFT HEART CATH AND CORONARY ANGIOGRAPHY
Anesthesia: Moderate Sedation

## 2020-12-20 MED ORDER — ASPIRIN 81 MG PO CHEW: 81.0000 mg | CHEWABLE_TABLET | ORAL | Status: DC

## 2020-12-20 MED ORDER — ADULT MULTIVITAMIN W/MINERALS CH
1.0000 | ORAL_TABLET | Freq: Every day | ORAL | Status: DC
  Administered 2020-12-21: 1 via ORAL
  Filled 2020-12-20 (×2): qty 1

## 2020-12-20 MED ORDER — ACETAMINOPHEN 325 MG PO TABS
650.0000 mg | ORAL_TABLET | Freq: Four times a day (QID) | ORAL | Status: DC | PRN
  Administered 2020-12-20: 650 mg via ORAL

## 2020-12-20 MED ORDER — SODIUM CHLORIDE 0.9 % WEIGHT BASED INFUSION: 1.0000 mL/kg/h | INTRAVENOUS | Status: DC

## 2020-12-20 MED ORDER — EZETIMIBE 10 MG PO TABS
10.0000 mg | ORAL_TABLET | Freq: Every day | ORAL | Status: DC
  Administered 2020-12-21: 10 mg via ORAL
  Filled 2020-12-20: qty 1

## 2020-12-20 MED ORDER — ZINC SULFATE 220 (50 ZN) MG PO CAPS
220.0000 mg | ORAL_CAPSULE | Freq: Every day | ORAL | Status: DC
  Administered 2020-12-21: 220 mg via ORAL
  Filled 2020-12-20: qty 1

## 2020-12-20 MED ORDER — OMEGA-3-ACID ETHYL ESTERS 1 G PO CAPS
1.0000 g | ORAL_CAPSULE | Freq: Two times a day (BID) | ORAL | Status: DC
  Administered 2020-12-20: 1 g via ORAL
  Filled 2020-12-20: qty 1

## 2020-12-20 MED ORDER — NITROGLYCERIN 0.4 MG SL SUBL: 0.4000 mg | SUBLINGUAL_TABLET | Freq: Once | SUBLINGUAL | Status: DC

## 2020-12-20 MED ORDER — FENTANYL CITRATE (PF) 100 MCG/2ML IJ SOLN
INTRAMUSCULAR | Status: DC | PRN
  Administered 2020-12-20 (×3): 25 ug via INTRAVENOUS

## 2020-12-20 MED ORDER — SODIUM CHLORIDE 0.9 % WEIGHT BASED INFUSION: 3.0000 mL/kg/h | INTRAVENOUS | Status: DC

## 2020-12-20 MED ORDER — HEPARIN SODIUM (PORCINE) 1000 UNIT/ML IJ SOLN
INTRAMUSCULAR | Status: AC
  Filled 2020-12-20: qty 1

## 2020-12-20 MED ORDER — ACETAMINOPHEN 325 MG PO TABS
ORAL_TABLET | ORAL | Status: AC
  Filled 2020-12-20: qty 2

## 2020-12-20 MED ORDER — TICAGRELOR 90 MG PO TABS
ORAL_TABLET | ORAL | Status: AC
  Filled 2020-12-20: qty 2

## 2020-12-20 MED ORDER — VERAPAMIL HCL 2.5 MG/ML IV SOLN
INTRAVENOUS | Status: DC | PRN
  Administered 2020-12-20: 2.5 mg via INTRA_ARTERIAL

## 2020-12-20 MED ORDER — CARVEDILOL 3.125 MG PO TABS
3.1250 mg | ORAL_TABLET | Freq: Two times a day (BID) | ORAL | Status: DC
  Administered 2020-12-21: 3.125 mg via ORAL
  Filled 2020-12-20 (×2): qty 1

## 2020-12-20 MED ORDER — OCUVITE-LUTEIN PO CAPS
1.0000 | ORAL_CAPSULE | Freq: Every day | ORAL | Status: DC
  Administered 2020-12-21: 1 via ORAL
  Filled 2020-12-20: qty 1

## 2020-12-20 MED ORDER — INSULIN ASPART 100 UNIT/ML IJ SOLN
0.0000 [IU] | Freq: Every day | INTRAMUSCULAR | Status: DC
  Administered 2020-12-20: 2 [IU] via SUBCUTANEOUS
  Filled 2020-12-20: qty 1

## 2020-12-20 MED ORDER — HYDRALAZINE HCL 20 MG/ML IJ SOLN: 5.0000 mg | INTRAMUSCULAR | Status: DC | PRN

## 2020-12-20 MED ORDER — HEPARIN SODIUM (PORCINE) 1000 UNIT/ML IJ SOLN
INTRAMUSCULAR | Status: DC | PRN
  Administered 2020-12-20: 6000 [IU] via INTRAVENOUS
  Administered 2020-12-20: 5000 [IU] via INTRAVENOUS

## 2020-12-20 MED ORDER — NITROGLYCERIN 0.4 MG SL SUBL: 0.4000 mg | SUBLINGUAL_TABLET | SUBLINGUAL | Status: DC | PRN

## 2020-12-20 MED ORDER — VITAMIN D 25 MCG (1000 UNIT) PO TABS
1000.0000 [IU] | ORAL_TABLET | Freq: Every day | ORAL | Status: DC
  Administered 2020-12-21: 1000 [IU] via ORAL
  Filled 2020-12-20: qty 1

## 2020-12-20 MED ORDER — TICAGRELOR 90 MG PO TABS
ORAL_TABLET | ORAL | Status: DC | PRN
  Administered 2020-12-20: 180 mg via ORAL

## 2020-12-20 MED ORDER — HEPARIN (PORCINE) IN NACL 1000-0.9 UT/500ML-% IV SOLN
INTRAVENOUS | Status: AC
  Filled 2020-12-20: qty 1000

## 2020-12-20 MED ORDER — SODIUM CHLORIDE 0.9 % IV SOLN: INTRAVENOUS | Status: AC

## 2020-12-20 MED ORDER — ENOXAPARIN SODIUM 40 MG/0.4ML IJ SOSY: 40.0000 mg | PREFILLED_SYRINGE | INTRAMUSCULAR | Status: DC

## 2020-12-20 MED ORDER — ONDANSETRON HCL 4 MG/2ML IJ SOLN: 4.0000 mg | Freq: Three times a day (TID) | INTRAMUSCULAR | Status: DC | PRN

## 2020-12-20 MED ORDER — HEPARIN (PORCINE) IN NACL 2000-0.9 UNIT/L-% IV SOLN
INTRAVENOUS | Status: DC | PRN
  Administered 2020-12-20: 1000 mL

## 2020-12-20 MED ORDER — SODIUM CHLORIDE 0.9 % IV SOLN: 250.0000 mL | INTRAVENOUS | Status: DC | PRN

## 2020-12-20 MED ORDER — VERAPAMIL HCL 2.5 MG/ML IV SOLN
INTRAVENOUS | Status: AC
  Filled 2020-12-20: qty 2

## 2020-12-20 MED ORDER — SODIUM CHLORIDE 0.9 % WEIGHT BASED INFUSION
3.0000 mL/kg/h | INTRAVENOUS | Status: DC
  Administered 2020-12-20: 3 mL/kg/h via INTRAVENOUS

## 2020-12-20 MED ORDER — FENTANYL CITRATE (PF) 100 MCG/2ML IJ SOLN
INTRAMUSCULAR | Status: AC
  Filled 2020-12-20: qty 2

## 2020-12-20 MED ORDER — ZINC 25 MG PO TABS: 1.0000 | ORAL_TABLET | Freq: Every day | ORAL | Status: DC

## 2020-12-20 MED ORDER — IOHEXOL 300 MG/ML  SOLN
INTRAMUSCULAR | Status: DC | PRN
  Administered 2020-12-20: 170 mL

## 2020-12-20 MED ORDER — SODIUM CHLORIDE 0.9% FLUSH: 3.0000 mL | INTRAVENOUS | Status: DC | PRN

## 2020-12-20 MED ORDER — ASPIRIN 81 MG PO CHEW
81.0000 mg | CHEWABLE_TABLET | Freq: Every day | ORAL | Status: DC
  Administered 2020-12-21: 81 mg via ORAL
  Filled 2020-12-20: qty 1

## 2020-12-20 MED ORDER — INSULIN ASPART 100 UNIT/ML IJ SOLN
0.0000 [IU] | Freq: Three times a day (TID) | INTRAMUSCULAR | Status: DC
  Filled 2020-12-20: qty 1

## 2020-12-20 MED ORDER — MORPHINE SULFATE (PF) 2 MG/ML IV SOLN: 2.0000 mg | INTRAVENOUS | Status: DC | PRN

## 2020-12-20 MED ORDER — LIDOCAINE HCL (PF) 1 % IJ SOLN
INTRAMUSCULAR | Status: DC | PRN
  Administered 2020-12-20: 2 mL

## 2020-12-20 MED ORDER — ENOXAPARIN SODIUM 60 MG/0.6ML IJ SOSY
0.5000 mg/kg | PREFILLED_SYRINGE | INTRAMUSCULAR | Status: DC
  Administered 2020-12-21: 57.5 mg via SUBCUTANEOUS
  Filled 2020-12-20: qty 0.57

## 2020-12-20 MED ORDER — VITAMIN B-12 1000 MCG PO TABS
1000.0000 ug | ORAL_TABLET | Freq: Every day | ORAL | Status: DC
  Administered 2020-12-21: 1000 ug via ORAL
  Filled 2020-12-20: qty 1

## 2020-12-20 MED ORDER — SODIUM CHLORIDE 0.9% FLUSH: 3.0000 mL | Freq: Two times a day (BID) | INTRAVENOUS | Status: DC

## 2020-12-20 MED ORDER — LIDOCAINE HCL (PF) 1 % IJ SOLN
INTRAMUSCULAR | Status: AC
  Filled 2020-12-20: qty 30

## 2020-12-20 MED ORDER — MORPHINE SULFATE (PF) 4 MG/ML IV SOLN
4.0000 mg | Freq: Once | INTRAVENOUS | Status: AC
  Administered 2020-12-20: 4 mg via INTRAVENOUS
  Filled 2020-12-20: qty 1

## 2020-12-20 MED ORDER — FISH OIL 1200 MG PO CAPS: 2400.0000 mg | ORAL_CAPSULE | Freq: Every day | ORAL | Status: DC

## 2020-12-20 MED ORDER — PANTOPRAZOLE SODIUM 40 MG PO TBEC
40.0000 mg | DELAYED_RELEASE_TABLET | Freq: Two times a day (BID) | ORAL | Status: DC
  Administered 2020-12-21: 40 mg via ORAL
  Filled 2020-12-20: qty 1

## 2020-12-20 MED ORDER — HEPARIN (PORCINE) 25000 UT/250ML-% IV SOLN
1400.0000 [IU]/h | INTRAVENOUS | Status: DC
  Administered 2020-12-20: 1400 [IU]/h via INTRAVENOUS
  Filled 2020-12-20: qty 250

## 2020-12-20 MED ORDER — MAGNESIUM OXIDE -MG SUPPLEMENT 400 (240 MG) MG PO TABS
400.0000 mg | ORAL_TABLET | Freq: Every day | ORAL | Status: DC
  Administered 2020-12-21: 400 mg via ORAL
  Filled 2020-12-20: qty 1

## 2020-12-20 MED ORDER — HEPARIN BOLUS VIA INFUSION
4000.0000 [IU] | Freq: Once | INTRAVENOUS | Status: AC
  Administered 2020-12-20: 4000 [IU] via INTRAVENOUS
  Filled 2020-12-20: qty 4000

## 2020-12-20 MED ORDER — OXYCODONE HCL 5 MG PO TABS: 5.0000 mg | ORAL_TABLET | Freq: Four times a day (QID) | ORAL | Status: DC | PRN

## 2020-12-20 MED ORDER — SODIUM CHLORIDE 0.9% FLUSH
3.0000 mL | Freq: Two times a day (BID) | INTRAVENOUS | Status: DC
  Administered 2020-12-21: 3 mL via INTRAVENOUS

## 2020-12-20 MED ORDER — MIDAZOLAM HCL 2 MG/2ML IJ SOLN
INTRAMUSCULAR | Status: AC
  Filled 2020-12-20: qty 2

## 2020-12-20 MED ORDER — MIDAZOLAM HCL 2 MG/2ML IJ SOLN
INTRAMUSCULAR | Status: DC | PRN
  Administered 2020-12-20 (×2): 1 mg via INTRAVENOUS

## 2020-12-20 MED ORDER — NITROGLYCERIN IN D5W 200-5 MCG/ML-% IV SOLN
0.0000 ug/min | INTRAVENOUS | Status: DC
  Administered 2020-12-20: 5 ug/min via INTRAVENOUS
  Filled 2020-12-20: qty 250

## 2020-12-20 MED ORDER — TICAGRELOR 90 MG PO TABS
90.0000 mg | ORAL_TABLET | Freq: Two times a day (BID) | ORAL | Status: DC
  Administered 2020-12-20 – 2020-12-21 (×2): 90 mg via ORAL
  Filled 2020-12-20 (×2): qty 1

## 2020-12-20 MED ORDER — VITAMIN E 180 MG (400 UNIT) PO CAPS
1200.0000 [IU] | ORAL_CAPSULE | Freq: Every day | ORAL | Status: DC
  Administered 2020-12-21: 1200 [IU] via ORAL
  Filled 2020-12-20: qty 3

## 2020-12-20 MED ORDER — NITROGLYCERIN 1 MG/10 ML FOR IR/CATH LAB
INTRA_ARTERIAL | Status: DC | PRN
  Administered 2020-12-20 (×2): 200 ug via INTRACORONARY

## 2020-12-20 SURGICAL SUPPLY — 21 items
BALLN MINITREK RX 2.0X12 (BALLOONS) ×3
BALLN ~~LOC~~ EUPHORA RX 3.0X20 (BALLOONS) ×3
BALLN ~~LOC~~ TREK RX 3.5X8 (BALLOONS) ×3
BALLOON MINITREK RX 2.0X12 (BALLOONS) ×1 IMPLANT
BALLOON ~~LOC~~ EUPHORA RX 3.0X20 (BALLOONS) ×1 IMPLANT
BALLOON ~~LOC~~ TREK RX 3.5X8 (BALLOONS) ×1 IMPLANT
CATH INFINITI 5FR JK (CATHETERS) ×3 IMPLANT
CATH LAUNCHER 6FR EBU3.5 (CATHETERS) ×3 IMPLANT
DRAPE BRACHIAL (DRAPES) ×3 IMPLANT
GLIDESHEATH SLEND SS 6F .021 (SHEATH) ×3 IMPLANT
KIT ENCORE 26 ADVANTAGE (KITS) ×3 IMPLANT
PACK CARDIAC CATH (CUSTOM PROCEDURE TRAY) ×3 IMPLANT
PROTECTION STATION PRESSURIZED (MISCELLANEOUS) ×3
SET ATX SIMPLICITY (MISCELLANEOUS) ×3 IMPLANT
STATION PROTECTION PRESSURIZED (MISCELLANEOUS) ×1 IMPLANT
STENT RESOLUTE ONYX 2.75X34 (Permanent Stent) ×3 IMPLANT
STENT RESOLUTE ONYX 3.0X12 (Permanent Stent) ×3 IMPLANT
TUBING CIL FLEX 10 FLL-RA (TUBING) ×3 IMPLANT
WIRE EMERALD 3MM-J .035X260CM (WIRE) ×3 IMPLANT
WIRE HITORQ VERSACORE ST 145CM (WIRE) ×3 IMPLANT
WIRE RUNTHROUGH .014X180CM (WIRE) ×3 IMPLANT

## 2020-12-20 NOTE — Consult Note (Addendum)
ANTICOAGULATION CONSULT NOTE  Pharmacy Consult for heparin Indication: chest pain/ACS  Allergies  Allergen Reactions   Ace Inhibitors Other (See Comments)    weakness   Isosorbide     weakness   Losartan     weakness   Metoprolol Tartrate Other (See Comments)    weakness   Statins Nausea And Vomiting and Other (See Comments)    Patient states he gets real weak and headache.   Sulfa Antibiotics Other (See Comments)    Unknown childhood reaction     Patient Measurements: Height: 6' (182.9 cm) Weight: 122.5 kg (270 lb) IBW/kg (Calculated) : 77.6 Heparin Dosing Weight: 104.6 kg  Vital Signs: Temp: 98.4 F (36.9 C) (07/01 0747) Temp Source: Oral (07/01 0747) BP: 147/72 (07/01 1000) Pulse Rate: 62 (07/01 1000)  Labs: Recent Labs    12/20/20 0749  HGB 13.9  HCT 40.0  PLT 166  CREATININE 1.32*  TROPONINIHS 141*    Estimated Creatinine Clearance: 64.4 mL/min (A) (by C-G formula based on SCr of 1.32 mg/dL (H)).   Medical History: Past Medical History:  Diagnosis Date   Cellulitis    Diabetes mellitus without complication (HCC)    MI, old     Medications:  No PTA anticoagulation or antiplatelet therapy Aspirin 81 mg was stopped in November due to upper GI bleed  Assessment: 75 y.o. male with past medical history of diabetes, CAD, and CKD who presents to the ED complaining of chest pain. The patient's chest pain eased after 3 doses of nitroglycerin and he was found to have a troponin elevated to 141. Pharmacy has been consulted for heparin dosing for ACS.  Hgb and plts WNL Heparin Dosing Weight: 104.6 kg  Goal of Therapy:  Heparin level 0.3-0.7 units/ml Monitor platelets by anticoagulation protocol: Yes   Plan:  Give 4000 units bolus x 1 Start heparin infusion at 1400 units/hr Check anti-Xa level in 8 hours and daily while on heparin Continue to monitor H&H and platelets  Derrek Gu, PharmD 12/20/2020,10:16 AM

## 2020-12-20 NOTE — Progress Notes (Signed)
PHARMACIST - PHYSICIAN COMMUNICATION  CONCERNING:  Enoxaparin (Lovenox) for DVT Prophylaxis    RECOMMENDATION: Patient was prescribed enoxaprin 40mg  q24 hours for VTE prophylaxis.   Filed Weights   12/20/20 0747 12/20/20 1341 12/20/20 1530  Weight: 122.5 kg (270 lb) 122.5 kg (270 lb) 112.5 kg (248 lb)    Body mass index is 33.63 kg/m.  Estimated Creatinine Clearance: 61.7 mL/min (A) (by C-G formula based on SCr of 1.32 mg/dL (H)).   Based on [facility] policy patient is candidate for enoxaparin 0.5mg /kg TBW SQ every 24 hours based on BMI being >30.   dESCRIPTION: Pharmacy has adjusted enoxaparin dose per [facility] policy.  Patient is now receiving enoxaparin 57.5 mg every 24 hours    [facility], PharmD Clinical Pharmacist  12/20/2020 6:45 PM

## 2020-12-20 NOTE — Plan of Care (Signed)
  Problem: Education: Goal: Knowledge of General Education information will improve Description: Including pain rating scale, medication(s)/side effects and non-pharmacologic comfort measures Outcome: Progressing   Problem: Clinical Measurements: Goal: Diagnostic test results will improve Outcome: Progressing   Problem: Coping: Goal: Level of anxiety will decrease Outcome: Progressing   

## 2020-12-20 NOTE — ED Notes (Signed)
Pt assisted to toilet to have BM 

## 2020-12-20 NOTE — Progress Notes (Signed)
Dr. Lady Gary and Dr. Clyde Lundborg visited pt. At bedside now. 12 lead EKG done. Urine for drug screen sent per orders. Phoned pt. Daughter Marylene Land to update her on pt. Status and pt. Spoke with her via phone. Right wrist Clean, dry, intact without any complications. Pt. C/o "aching" 1-2/10 on scale 1-10. In no acute distress at present.

## 2020-12-20 NOTE — ED Notes (Signed)
MD at bedside. 

## 2020-12-20 NOTE — H&P (View-Only) (Signed)
Cardiology Consultation Note    Patient ID: Aaron [name]., MRN: [id number], DOB/AGE: Jan 14, 1946 75 y.o. Admit date: 12/20/2020   Date of Consult: 12/20/2020 Primary Physician: Lonie Peak, [name] Primary Cardiologist: Dr. Gwen Pounds  Chief Complaint: chest paiin Reason for Consultation: chest pain/abnormal troponin Requesting MD: Dr. Larinda Buttery  HPI: Thane Age. is a 75 y.o. male with history of coronary artery disease status post PCI of the mid RCA with a drug-eluting stent in 2017.  He had moderate disease in small vessels including the diagonal and circumflex.  He underwent a repeat cardiac catheterization in April 2018 showing a patent stent in the LAD and RCA.  Medical management is recommended.  Cardiac catheterization in December 2020 revealed a 95% distal RCA stenosis and underwent PCI.  There was a patent stent in the mid RCA a.  He now returns with complaints of chest pain.  EKG is nondiagnostic.  Initial troponin was mildly elevated at 141.  Was placed on heparin given nitrates and morphine.  Hemodynamically stable.  Patient has mildly acute on chronic renal insufficiency with a creatinine of 1.32 up from 1.12.  Subsequent troponins are pending.  Patient denies of positional or pleuritic component to his chest pain.  He was diagnosed with COVID-19 approximately 2 weeks ago but had been feeling better until approximately 2 days ago.  He was more weak than he had been previously starting 2 days ago.  He is on Zetia, glipizide, aspirin, losartan, prasugrel 10 mg daily as an outpatient.  Past Medical History:  Diagnosis Date   Cellulitis    Diabetes mellitus without complication (HCC)    MI, old       Surgical History:  Past Surgical History:  Procedure Laterality Date   ABDOMINAL SURGERY     APPENDECTOMY     CARDIAC CATHETERIZATION Left 10/21/2015   Procedure: Left Heart Cath and Coronary Angiography;  Surgeon: Lamar Blinks, MD;  Location: [facility];   Service: Cardiovascular;  Laterality: Left;   CARDIAC CATHETERIZATION N/A 10/21/2015   Procedure: Coronary Stent Intervention;  Surgeon: Marcina Millard, MD;  Location: [facility];  Service: Cardiovascular;  Laterality: N/A;   CHOLECYSTECTOMY     CORONARY ANGIOPLASTY WITH STENT PLACEMENT     CORONARY STENT INTERVENTION N/A 06/13/2019   Procedure: CORONARY STENT INTERVENTION;  Surgeon: Marcina Millard, MD;  Location: [facility];  Service: Cardiovascular;  Laterality: N/A;   LEFT HEART CATH AND CORONARY ANGIOGRAPHY Left 10/07/2016   Procedure: Left Heart Cath and Coronary Angiography;  Surgeon: Lamar Blinks, MD;  Location: [facility];  Service: Cardiovascular;  Laterality: Left;   LEFT HEART CATH AND CORONARY ANGIOGRAPHY N/A 06/13/2019   Procedure: LEFT HEART CATH AND CORONARY ANGIOGRAPHY;  Surgeon: Lamar Blinks, MD;  Location: [facility];  Service: Cardiovascular;  Laterality: N/A;   SHOULDER ARTHROSCOPY WITH SUBACROMIAL DECOMPRESSION AND BICEP TENDON REPAIR Right 02/24/2018   Procedure: SHOULDER ARTHROSCOPY WITH SUBACROMIAL DEBRIDEMENT, DECOMPRESSION, ROTATOR CUFF REPAIR AND POSSIBLE BICEP TENODESIS;  Surgeon: Christena Flake, MD;  Location: [facility];  Service: Orthopedics;  Laterality: Right;   SHOULDER ARTHROSCOPY WITH SUBACROMIAL DECOMPRESSION AND BICEP TENDON REPAIR Left 04/25/2019   Procedure: SHOULDER ARTHROSCOPY WITH DEBRIDEMENT, DECOMPRESSION AND BICEP TENDON REPAIR, ROTATOR CUFF REPAIR;  Surgeon: Christena Flake, MD;  Location: [facility];  Service: Orthopedics;  Laterality: Left;   TONSILLECTOMY       Home Meds: Prior to Admission medications   Medication  Sig Start Date End Date Taking? Authorizing Provider  cholecalciferol (VITAMIN D3) 25 MCG (1000 UNIT) tablet Take 1,000 Units by mouth daily.   Yes [provider]  ezetimibe (ZETIA) 10 MG tablet Take 1 tablet (10 mg total) by mouth daily. 06/15/19  Yes Lynn ItoAmery, Sahar, MD   glipiZIDE (GLUCOTROL XL) 10 MG 24 hr tablet  11/26/20  Yes [provider]  ketoconazole (NIZORAL) 2 % shampoo Apply 1 application topically 2 (two) times a week. 02/03/18  Yes [provider]  MAGNESIUM OXIDE PO Take by mouth.   Yes [provider]  MANGANESE PO Take by mouth.   Yes [provider]  Multiple Vitamin (MULTIVITAMIN PO) Take by mouth.   Yes [provider]  Multiple Vitamins-Minerals (ZINC PO) Take by mouth.   Yes [provider]  multivitamin-lutein (OCUVITE-LUTEIN) CAPS capsule Take 1 capsule by mouth daily.   Yes [provider]  nitroGLYCERIN (NITROSTAT) 0.4 MG SL tablet Take 0.4 mg by mouth every 5 (five) minutes as needed for chest pain.  03/29/14  Yes [provider]  Omega-3 Fatty Acids (FISH OIL) 1200 MG CAPS Take 2,400 mg by mouth daily.    Yes [provider]  pantoprazole (PROTONIX) 40 MG tablet Take 1 tablet (40 mg total) by mouth 2 (two) times daily before a meal. 06/14/19  Yes Lynn ItoAmery, Sahar, MD  traMADol (ULTRAM) 50 MG tablet Take 50 mg by mouth every 6 (six) hours as needed for moderate pain.   Yes [provider]  vitamin B-12 (CYANOCOBALAMIN) 1000 MCG tablet Take 1,000 mcg by mouth daily.   Yes [provider]  vitamin E 1000 UNIT capsule Take 1,000 Units by mouth daily.   Yes [provider]  aspirin 81 MG chewable tablet Chew 1 tablet (81 mg total) by mouth daily. Patient not taking: No sig reported 06/15/19   Lynn ItoAmery, Sahar, MD  losartan (COZAAR) 25 MG tablet Take 1 tablet (25 mg total) by mouth daily. Patient not taking: No sig reported 06/15/19   Lynn ItoAmery, Sahar, MD  oxyCODONE (ROXICODONE) 5 MG immediate release tablet Take 1-2 tablets (5-10 mg total) by mouth every 4 (four) hours as needed. Patient not taking: Reported on 12/20/2020 04/25/19   Poggi, Excell SeltzerJohn J, MD  PAXLOVID 20 x 150 MG & 10 x 100MG  TBPK SMARTSIG:Pack(s) By Mouth Twice Daily Patient not taking: No  sig reported 11/29/20   [provider]  prasugrel (EFFIENT) 10 MG TABS tablet Take 1 tablet (10 mg total) by mouth daily. Patient not taking: No sig reported 06/15/19   Lynn ItoAmery, Sahar, MD    Inpatient Medications:    heparin 1,400 Units/hr (12/20/20 1053)   nitroGLYCERIN 5 mcg/min (12/20/20 1116)    Allergies:  Allergies  Allergen Reactions   Ace Inhibitors Other (See Comments)    weakness   Isosorbide     weakness   Losartan     weakness   Metoprolol Tartrate Other (See Comments)    weakness   Statins Nausea And Vomiting and Other (See Comments)    Patient states he gets real weak and headache.   Sulfa Antibiotics Other (See Comments)    Unknown childhood reaction     Social History   Socioeconomic History   Marital status: Widowed    Spouse name: Not on file   Number of children: Not on file   Years of education: Not on file   Highest education level: Not on file  Occupational History   Not on file  Tobacco Use   Smoking status: Former    Pack years: 0.00   Smokeless tobacco: Former  Building services engineer Use: Never used  Substance and Sexual Activity   Alcohol use: No   Drug use: No   Sexual activity: Not on file  Other Topics Concern   Not on file  Social History Narrative   Not on file   Social Determinants of Health   Financial Resource Strain: Not on file  Food Insecurity: Not on file  Transportation Needs: Not on file  Physical Activity: Not on file  Stress: Not on file  Social Connections: Not on file  Intimate Partner Violence: Not on file     Family History  Problem Relation Age of Onset   Angina Mother      Review of Systems: A 12-system review of systems was performed and is negative except as noted in the HPI.  Labs: No results for input(s): CKTOTAL, CKMB, TROPONINI in the last 72 hours. Lab Results  Component Value Date   WBC 9.0 12/20/2020   HGB 13.9 12/20/2020   HCT 40.0 12/20/2020   MCV 89.9 12/20/2020   PLT 166  12/20/2020    Recent Labs  Lab 12/20/20 0749  NA 135  K 4.8  CL 102  CO2 26  BUN 29*  CREATININE 1.32*  CALCIUM 8.8*  GLUCOSE 284*   Lab Results  Component Value Date   CHOL 148 06/13/2019   HDL 25 (L) 06/13/2019   LDLCALC UNABLE TO CALCULATE IF TRIGLYCERIDE OVER 400 mg/dL 37/09/8887   TRIG 169 (H) 06/13/2019   Lab Results  Component Value Date   DDIMER 0.40 12/20/2020    Radiology/Studies:  DG Chest 2 View  Result Date: 12/20/2020 CLINICAL DATA:  Acute chest pain EXAM: CHEST - 2 VIEW COMPARISON:  06/12/2019 FINDINGS: The cardiomediastinal silhouette is unremarkable. There is no evidence of focal airspace disease, pulmonary edema, suspicious pulmonary nodule/mass, pleural effusion, or pneumothorax. No acute bony abnormalities are identified. IMPRESSION: No active cardiopulmonary disease. Electronically Signed   By: Harmon Pier M.D.   On: 12/20/2020 08:30    Wt Readings from Last 3 Encounters:  12/20/20 122.5 kg  06/14/19 108.1 kg  04/18/19 111.1 kg    EKG: Normal sinus rhythm with nonspecific ST-T wave changes.  Physical Exam:  Blood pressure (!) 148/78, pulse 78, temperature 98.4 F (36.9 C), temperature source Oral, resp. rate 12, height 6' (1.829 m), weight 122.5 kg, SpO2 98 %. Body mass index is 36.62 kg/m. General: Well developed, well nourished, in no acute distress. Head: Normocephalic, atraumatic, sclera non-icteric, no xanthomas, nares are without discharge.  Neck: Negative for carotid bruits. JVD not elevated. Lungs: Clear bilaterally to auscultation without wheezes, rales, or rhonchi. Breathing is unlabored. Heart: RRR with S1 S2. No murmurs, rubs, or gallops appreciated. Abdomen: Soft, non-tender, non-distended with normoactive bowel sounds. No hepatomegaly. No rebound/guarding. No obvious abdominal masses. Msk:  Strength and tone appear normal for age. Extremities: No clubbing or cyanosis. No edema.  Distal pedal pulses are 2+ and equal  bilaterally. Neuro: Alert and oriented X 3. No facial asymmetry. No focal deficit. Moves all extremities spontaneously. Psych:  Responds to questions appropriately with a normal affect.     Assessment and Plan  75 year old male with coronary disease status post PCI of the RCA x2 now presenting with chest pain with both typical atypical features.  EKG nondiagnostic.  Is on Zetia and aspirin and prasugrel.  Is statin intolerant.  Initial troponin  mildly elevated.  Awaiting subsequent 1.  We will continue with heparin, as needed morphine and depending on clinical course, symptoms and troponin, will consider medical management versus relook cardiac catheterization.  Signed, Dalia Heading MD 12/20/2020, 11:21 AM Pager: [phone]   Still having chest pain with rising troponin. Will proceed with cardiac cath with possible pci this afternoon.

## 2020-12-20 NOTE — Interval H&P Note (Signed)
History and Physical Interval Note:  12/20/2020 3:29 PM  Aaron [name].  has presented today for surgery, with the diagnosis of nstemi.  The various methods of treatment have been discussed with the patient and family. After consideration of risks, benefits and other options for treatment, the patient has consented to  Procedure(s): LEFT HEART CATH AND CORONARY ANGIOGRAPHY (N/A) as a surgical intervention.  The patient's history has been reviewed, patient examined, no change in status, stable for surgery.  I have reviewed the patient's chart and labs.  Questions were answered to the patient's satisfaction.     Lorine Bears

## 2020-12-20 NOTE — H&P (Signed)
History and Physical    [name]. NGE:[id number] DOB: May 13, 1946 DOA: 12/20/2020  Referring MD/NP/PA:   PCP: Lonie Peak, [name]   Patient coming from:  The patient is coming from home.  At baseline, pt is independent for most of ADL.        Chief Complaint: chest pain  HPI: Aaron [name]. is a 75 y.o. male with medical history significant of hypertension, hyperlipidemia, diabetes mellitus, CAD, stent placement, CKD-3A, sCHF with EF of 40%, who presents with chest pain.  Patient states that he has been having intermittent chest pain for about 7 days, which has been progressively worsening since this morning.  The chest pain is located in the substernal area, initially severe, pressure-like, radiating to his throat.  Patient has mild dry cough, no shortness of breath, fever or chills.  Denies nausea vomiting, diarrhea or abdominal pain.  No symptoms of UTI.  Patient was found to have elevated troponin 141 in ED, cardiology is consulted. patient underwent urgent cardiac cath by Dr. Kirke Corin, which showed showed 99% circumflex and 80% LAD disease, both were stented.   ED Course: pt was found to have troponin level 141, negative D-dimer 0.40, negative COVID PCR, slightly worsening renal function, temperature normal, blood pressure 148/78, heart rate 78, RR 21, oxygen saturation 98% on room air.  Chest x-ray negative.  Patient is placed on progressive bed for observation.  Review of Systems:   General: no fevers, chills, no body weight gain, has fatigue HEENT: no blurry vision, hearing changes or sore throat Respiratory: no dyspnea, has mild coughing, no wheezing CV: has chest pain, no palpitations GI: no nausea, vomiting, abdominal pain, diarrhea, constipation GU: no dysuria, burning on urination, increased urinary frequency, hematuria  Ext: has mild leg edema Neuro: no unilateral weakness, numbness, or tingling, no vision change or hearing loss Skin: no rash, no skin  tear. MSK: No muscle spasm, no deformity, no limitation of range of movement in spin Heme: No easy bruising.  Travel history: No recent long distant travel.  Allergy:  Allergies  Allergen Reactions   Ace Inhibitors Other (See Comments)    weakness   Isosorbide     weakness   Losartan     weakness   Metoprolol Tartrate Other (See Comments)    weakness   Statins Nausea And Vomiting and Other (See Comments)    Patient states he gets real weak and headache.   Sulfa Antibiotics Other (See Comments)    Unknown childhood reaction     Past Medical History:  Diagnosis Date   Cellulitis    Diabetes mellitus without complication (HCC)    MI, old     Past Surgical History:  Procedure Laterality Date   ABDOMINAL SURGERY     APPENDECTOMY     CARDIAC CATHETERIZATION Left 10/21/2015   Procedure: Left Heart Cath and Coronary Angiography;  Surgeon: Lamar Blinks, MD;  Location: [facility];  Service: Cardiovascular;  Laterality: Left;   CARDIAC CATHETERIZATION N/A 10/21/2015   Procedure: Coronary Stent Intervention;  Surgeon: Marcina Millard, MD;  Location: [facility];  Service: Cardiovascular;  Laterality: N/A;   CHOLECYSTECTOMY     CORONARY ANGIOPLASTY WITH STENT PLACEMENT     CORONARY STENT INTERVENTION N/A 06/13/2019   Procedure: CORONARY STENT INTERVENTION;  Surgeon: Marcina Millard, MD;  Location: [facility];  Service: Cardiovascular;  Laterality: N/A;   LEFT HEART CATH AND CORONARY ANGIOGRAPHY Left 10/07/2016   Procedure: Left Heart  Cath and Coronary Angiography;  Surgeon: Lamar Blinks, MD;  Location: [facility];  Service: Cardiovascular;  Laterality: Left;   LEFT HEART CATH AND CORONARY ANGIOGRAPHY N/A 06/13/2019   Procedure: LEFT HEART CATH AND CORONARY ANGIOGRAPHY;  Surgeon: Lamar Blinks, MD;  Location: [facility];  Service: Cardiovascular;  Laterality: N/A;   SHOULDER ARTHROSCOPY WITH SUBACROMIAL DECOMPRESSION AND  BICEP TENDON REPAIR Right 02/24/2018   Procedure: SHOULDER ARTHROSCOPY WITH SUBACROMIAL DEBRIDEMENT, DECOMPRESSION, ROTATOR CUFF REPAIR AND POSSIBLE BICEP TENODESIS;  Surgeon: Christena Flake, MD;  Location: [facility];  Service: Orthopedics;  Laterality: Right;   SHOULDER ARTHROSCOPY WITH SUBACROMIAL DECOMPRESSION AND BICEP TENDON REPAIR Left 04/25/2019   Procedure: SHOULDER ARTHROSCOPY WITH DEBRIDEMENT, DECOMPRESSION AND BICEP TENDON REPAIR, ROTATOR CUFF REPAIR;  Surgeon: Christena Flake, MD;  Location: [facility];  Service: Orthopedics;  Laterality: Left;   TONSILLECTOMY      Social History:  reports that he has quit smoking. He has quit using smokeless tobacco. He reports that he does not drink alcohol and does not use drugs.  Family History:  Family History  Problem Relation Age of Onset   Angina Mother      Prior to Admission medications   Medication Sig Start Date End Date Taking? Authorizing Provider  cholecalciferol (VITAMIN D3) 25 MCG (1000 UNIT) tablet Take 1,000 Units by mouth daily.   Yes [provider]  ezetimibe (ZETIA) 10 MG tablet Take 1 tablet (10 mg total) by mouth daily. 06/15/19  Yes Lynn Ito, MD  glipiZIDE (GLUCOTROL XL) 10 MG 24 hr tablet  11/26/20  Yes [provider]  ketoconazole (NIZORAL) 2 % shampoo Apply 1 application topically 2 (two) times a week. 02/03/18  Yes [provider]  MAGNESIUM OXIDE PO Take by mouth.   Yes [provider]  MANGANESE PO Take by mouth.   Yes [provider]  Multiple Vitamin (MULTIVITAMIN PO) Take by mouth.   Yes [provider]  Multiple Vitamins-Minerals (ZINC PO) Take by mouth.   Yes [provider]  multivitamin-lutein (OCUVITE-LUTEIN) CAPS capsule Take 1 capsule by mouth daily.   Yes [provider]  nitroGLYCERIN (NITROSTAT) 0.4 MG SL tablet Take 0.4 mg by mouth every 5 (five) minutes as needed for chest pain.  03/29/14  Yes [provider]  Omega-3 Fatty  Acids (FISH OIL) 1200 MG CAPS Take 2,400 mg by mouth daily.    Yes [provider]  pantoprazole (PROTONIX) 40 MG tablet Take 1 tablet (40 mg total) by mouth 2 (two) times daily before a meal. 06/14/19  Yes Lynn Ito, MD  traMADol (ULTRAM) 50 MG tablet Take 50 mg by mouth every 6 (six) hours as needed for moderate pain.   Yes [provider]  vitamin B-12 (CYANOCOBALAMIN) 1000 MCG tablet Take 1,000 mcg by mouth daily.   Yes [provider]  vitamin E 1000 UNIT capsule Take 1,000 Units by mouth daily.   Yes [provider]  aspirin 81 MG chewable tablet Chew 1 tablet (81 mg total) by mouth daily. Patient not taking: No sig reported 06/15/19   Lynn Ito, MD  losartan (COZAAR) 25 MG tablet Take 1 tablet (25 mg total) by mouth daily. Patient not taking: No sig reported 06/15/19   Lynn Ito, MD  oxyCODONE (ROXICODONE) 5 MG immediate release tablet Take 1-2 tablets (5-10 mg total) by mouth every 4 (four) hours as needed. Patient not taking: Reported on 12/20/2020 04/25/19   Poggi, Excell Seltzer, MD  PAXLOVID 20 x 150 MG & 10 x 100MG  TBPK SMARTSIG:Pack(s) By Mouth Twice Daily Patient not taking: No sig reported 11/29/20   [provider]  prasugrel (EFFIENT) 10 MG TABS tablet Take 1 tablet (10 mg total) by mouth daily. Patient not taking: No sig reported 06/15/19   06/17/19, MD    Physical Exam: Vitals:   12/20/20 1526 12/20/20 1530 12/20/20 1655 12/20/20 1700  BP:   127/78 133/74  Pulse:    80  Resp:    13  Temp:      TempSrc:      SpO2: 100%   98%  Weight:  112.5 kg    Height:       General: Not in acute distress HEENT:       Eyes: PERRL, EOMI, no scleral icterus.       ENT: No discharge from the ears and nose, no pharynx injection, no tonsillar enlargement.        Neck: No JVD, no bruit, no mass felt. Heme: No neck lymph node enlargement. Cardiac: S1/S2, RRR, No murmurs, No gallops or rubs. Respiratory: No rales, wheezing, rhonchi or  rubs. GI: Soft, nondistended, nontender, no rebound pain, no organomegaly, BS present. GU: No hematuria Ext: has trace leg edema bilaterally. 1+DP/PT pulse bilaterally. Musculoskeletal: No joint deformities, No joint redness or warmth, no limitation of ROM in spin. Skin: No rashes.  Neuro: Alert, oriented X3, cranial nerves II-XII grossly intact, moves all extremities normally.  Psych: Patient is not psychotic, no suicidal or hemocidal ideation.  Labs on Admission: I have personally reviewed following labs and imaging studies  CBC: Recent Labs  Lab 12/20/20 0749  WBC 9.0  HGB 13.9  HCT 40.0  MCV 89.9  PLT 166   Basic Metabolic Panel: Recent Labs  Lab 12/20/20 0749  NA 135  K 4.8  CL 102  CO2 26  GLUCOSE 284*  BUN 29*  CREATININE 1.32*  CALCIUM 8.8*   GFR: Estimated Creatinine Clearance: 61.7 mL/min (A) (by C-G formula based on SCr of 1.32 mg/dL (H)). Liver Function Tests: No results for input(s): AST, ALT, ALKPHOS, BILITOT, PROT, ALBUMIN in the last 168 hours. No results for input(s): LIPASE, AMYLASE in the last 168 hours. No results for input(s): AMMONIA in the last 168 hours. Coagulation Profile: Recent Labs  Lab 12/20/20 0746  INR 1.0   Cardiac Enzymes: No results for input(s): CKTOTAL, CKMB, CKMBINDEX, TROPONINI in the last 168 hours. BNP (last 3 results) No results for input(s): PROBNP in the last 8760 hours. HbA1C: No results for input(s): HGBA1C in the last 72 hours. CBG: Recent Labs  Lab 12/20/20 1330 12/20/20 1651  GLUCAP 232* 195*   Lipid Profile: No results for input(s): CHOL, HDL, LDLCALC, TRIG, CHOLHDL, LDLDIRECT in the last 72 hours. Thyroid Function Tests: No results for input(s): TSH, T4TOTAL, FREET4, T3FREE, THYROIDAB in the last 72 hours. Anemia Panel: No results for input(s): VITAMINB12, FOLATE, FERRITIN, TIBC, IRON, RETICCTPCT in the last 72 hours. Urine analysis:    Component Value Date/Time   COLORURINE STRAW (A) 02/24/2015  1340   APPEARANCEUR CLEAR (A) 02/24/2015 1340   APPEARANCEUR CLEAR 09/12/2014 0842   LABSPEC 1.011 02/24/2015 1340   LABSPEC 1.019 09/12/2014 0842   PHURINE 5.0 02/24/2015 1340   GLUCOSEU NEGATIVE 02/24/2015 1340   GLUCOSEU NEGATIVE 09/12/2014 0842   HGBUR NEGATIVE 02/24/2015 1340   BILIRUBINUR NEGATIVE 02/24/2015 1340   BILIRUBINUR NEGATIVE 09/12/2014 0842   KETONESUR NEGATIVE 02/24/2015 1340   PROTEINUR NEGATIVE 02/24/2015  1340   NITRITE NEGATIVE 02/24/2015 1340   LEUKOCYTESUR NEGATIVE 02/24/2015 1340   LEUKOCYTESUR NEGATIVE 09/12/2014 0842   Sepsis Labs: @LABRCNTIP (procalcitonin:4,lacticidven:4) ) Recent Results (from the past 240 hour(s))  Resp Panel by RT-PCR (Flu A&B, Covid) Nasopharyngeal Swab     Status: None   Collection Time: 12/20/20 10:49 AM   Specimen: Nasopharyngeal Swab; Nasopharyngeal(NP) swabs in vial transport medium  Result Value Ref Range Status   SARS Coronavirus 2 by RT PCR NEGATIVE NEGATIVE Final    Comment: (NOTE) SARS-CoV-2 target nucleic acids are NOT DETECTED.  The SARS-CoV-2 RNA is generally detectable in upper respiratory specimens during the acute phase of infection. The lowest concentration of SARS-CoV-2 viral copies this assay can detect is 138 copies/mL. A negative result does not preclude SARS-Cov-2 infection and should not be used as the sole basis for treatment or other patient management decisions. A negative result may occur with  improper specimen collection/handling, submission of specimen other than nasopharyngeal swab, presence of viral mutation(s) within the areas targeted by this assay, and inadequate number of viral copies(<138 copies/mL). A negative result must be combined with clinical observations, patient history, and epidemiological information. The expected result is Negative.  Fact Sheet for Patients:  02/20/21  Fact Sheet for Healthcare Providers:   BloggerCourse.com  This test is no t yet approved or cleared by the [employer] FDA and  has been authorized for detection and/or diagnosis of SARS-CoV-2 by FDA under an Emergency Use Authorization (EUA). This EUA will remain  in effect (meaning this test can be used) for the duration of the COVID-19 declaration under Section 564(b)(1) of the Act, 21 U.S.C.section 360bbb-3(b)(1), unless the authorization is terminated  or revoked sooner.       Influenza A by PCR NEGATIVE NEGATIVE Final   Influenza B by PCR NEGATIVE NEGATIVE Final    Comment: (NOTE) The Xpert Xpress SARS-CoV-2/FLU/RSV plus assay is intended as an aid in the diagnosis of influenza from Nasopharyngeal swab specimens and should not be used as a sole basis for treatment. Nasal washings and aspirates are unacceptable for Xpert Xpress SARS-CoV-2/FLU/RSV testing.  Fact Sheet for Patients: [place]  Fact Sheet for Healthcare Providers: BloggerCourse.com  This test is not yet approved or cleared by the [employer] FDA and has been authorized for detection and/or diagnosis of SARS-CoV-2 by FDA under an Emergency Use Authorization (EUA). This EUA will remain in effect (meaning this test can be used) for the duration of the COVID-19 declaration under Section 564(b)(1) of the Act, 21 U.S.C. section 360bbb-3(b)(1), unless the authorization is terminated or revoked.  Performed at [facility], [address], [place], [place]      Radiological Exams on Admission: DG Chest 2 View  Result Date: 12/20/2020 CLINICAL DATA:  Acute chest pain EXAM: CHEST - 2 VIEW COMPARISON:  06/12/2019 FINDINGS: The cardiomediastinal silhouette is unremarkable. There is no evidence of focal airspace disease, pulmonary edema, suspicious pulmonary nodule/mass, pleural effusion, or pneumothorax. No acute bony abnormalities are identified.  IMPRESSION: No active cardiopulmonary disease. Electronically Signed   By: 06/14/2019 M.D.   On: 12/20/2020 08:30   CARDIAC CATHETERIZATION  Result Date: 12/20/2020  There is mild left ventricular systolic dysfunction.  LV end diastolic pressure is mildly elevated.  The left ventricular ejection fraction is 45-50% by visual estimate.  Mid LM to Dist LM lesion is 20% stenosed.  Ost LAD lesion is 30% stenosed.  Mid LAD lesion is 10% stenosed.  Mid LAD to [facility] LAD  lesion is 30% stenosed.  Prox Cx to Mid Cx lesion is 60% stenosed.  Mid Cx to Dist Cx lesion is 99% stenosed.  A drug-eluting stent was successfully placed using a STENT RESOLUTE ONYX [id number].  Post intervention, there is a 0% residual stenosis.  Post intervention, there is a 0% residual stenosis.  Prox LAD to Mid LAD lesion is 80% stenosed.  Post intervention, there is a 0% residual stenosis.  A drug-eluting stent was successfully placed using a STENT RESOLUTE ONYX [id number].  Prox RCA lesion is 50% stenosed.  Prox RCA to Mid RCA lesion is 10% stenosed.  Previously placed Dist RCA stent (unknown type) is widely patent.  1.  Significant underlying three-vessel coronary artery disease with patent RCA stents with minimal restenosis.  There is moderate proximal RCA stenosis.  The mid LAD stent is patent but there is significant stenosis proximal to the stent.  In addition, there is severe mid left circumflex disease which seems to be the culprit for non-ST elevation myocardial infarction. 2.  Mildly reduced LV systolic function with mildly elevated left ventricular end-diastolic pressure. 3.  Successful angioplasty and drug-eluting stent placement to the left circumflex as well as LAD. Recommendations: Dual antiplatelet therapy for at least 12 years and consider longer given multiple stents. Aggressive treatment of risk factors.  Consider alternative hyperlipidemia therapy given intolerance to statins. Will start small dose carvedilol for  elevated blood pressure. The patient will be continued on nitroglycerin drip until his chest pain is resolved. Possible discharge home tomorrow if stable.     EKG: I have personally reviewed.  Sinus rhythm, QTC 451, low voltage, LAD, poor R wave progression, nonspecific to change   Assessment/Plan Principal Problem:   NSTEMI (non-ST elevated myocardial infarction) (HCC) Active Problems:   CAD (coronary artery disease)   CKD (chronic kidney disease), stage IIIa   Type II diabetes mellitus with renal manifestations (HCC)   HLD (hyperlipidemia)   GERD (gastroesophageal reflux disease)   Chronic systolic CHF (congestive heart failure) (HCC)   NSTEMI (non-ST elevated myocardial infarction) (HCC) and hx of CAD: trop 141.  Patient underwent urgent dialysis by Dr. Kary KosArita.  Stents were placed.  Patient was initially treated with IV heparin.  - place to progressive unit for observation - Trend Trop - Repeat EKG in the am  - prn Nitroglycerin, Morphine, and aspirin, zetia, Brilinta - Risk factor stratification: will check FLP and A1C  - check UDS  CKD (chronic kidney disease), stage IIIa: Renal function close to baseline.  Recent baseline creatinine 1.12, his creatinine is 1.32, BUN 29 -f/u with BMP  Type II diabetes mellitus with renal manifestations [facility](HCC): Recent A1c 9.6.  Poorly controlled.  Patient is taking glipizide -Sliding scale insulin  HLD (hyperlipidemia): Patient is allergic to statin -Zetia   GERD (gastroesophageal reflux disease) -Protonix  Chronic systolic CHF (congestive heart failure) (HCC): 2D echo 07/22/2020 showed EF of 40%.  Patient has trace leg edema.  No respiratory distress.  Chest x-ray negative for edema.  CHF seem to be compensated. -Check BNP     DVT ppx: SQ Lovenox Code Status: Full code Family Communication:   Yes, patient's daughter by phone Disposition Plan:  Anticipate discharge back to previous environment Consults called: Dr. Lady GaryFath and Dr. Kirke CorinArida  of card Admission status and Level of care: Progressive Cardiac:  for obs        Status is: Observation  The patient remains OBS appropriate and will d/c before 2 midnights.  Dispo: The patient  is from: Home              Anticipated d/c is to: Home              Patient currently is not medically stable to d/c.   Difficult to place patient No          Date of Service 12/20/2020    Lorretta Harp [facility]   If 7PM-7AM, please contact night-coverage [email or web address] 12/20/2020, 5:14 PM

## 2020-12-20 NOTE — ED Notes (Signed)
Informed RN bed assigned 

## 2020-12-20 NOTE — ED Notes (Signed)
Pt gone to cathlab. 

## 2020-12-20 NOTE — Progress Notes (Signed)
Critical Lab  Troponin 24,000  Dr. Arville Care notified 12/20/2020 at 2100.

## 2020-12-20 NOTE — ED Provider Notes (Signed)
[facility] Emergency Department Provider Note   ____________________________________________   Event Date/Time   First MD Initiated Contact with Patient 12/20/20 [id number]     (approximate)  I have reviewed the triage vital signs and the nursing notes.   HISTORY  Chief Complaint Chest Pain    HPI Aaron [name]. is a 75 y.o. male with past medical history of diabetes, CAD, and CKD who presents to the ED complaining of chest pain.  Patient reports that he was woken from sleep this morning with severe pain in the center of his chest.  He describes it as a pressure that has gradually eased since onset but then seems to worsen when he tries to stand up and walk.  He denies any associated fevers, cough, shortness of breath, pain or swelling in his legs.  He does state for the past 2 days he has been feeling weaker than usual with some mild discomfort in his chest when he stands up to walk.  He took some nitroglycerin yesterday without significant relief, again tried nitroglycerin this morning and had some easing of his pain after 3 doses.  He took 324 mg of aspirin prior to EMS arrival this morning.  He does state that he was diagnosed with COVID-19 about 3 weeks ago, but had been feeling better until 2 days ago.        Past Medical History:  Diagnosis Date   Cellulitis    Diabetes mellitus without complication (HCC)    MI, old     Patient Active Problem List   Diagnosis Date Noted   Chest pain 06/12/2019   CKD (chronic kidney disease), stage IIIa 06/12/2019   NSTEMI (non-ST elevated myocardial infarction) (HCC) 06/12/2019   Dark stools 06/12/2019   Type II diabetes mellitus with renal manifestations (HCC) 06/12/2019   Unstable angina (HCC) 10/21/2015   CAD (coronary artery disease) 10/21/2015    Past Surgical History:  Procedure Laterality Date   ABDOMINAL SURGERY     APPENDECTOMY     CARDIAC CATHETERIZATION Left 10/21/2015   Procedure: Left Heart  Cath and Coronary Angiography;  Surgeon: Lamar Blinks, MD;  Location: [facility] IN[facility];  Service: Cardiovascular;  Laterality: Left;   CARDIAC CATHETERIZATION N/A 10/21/2015   Procedure: Coronary Stent Intervention;  Surgeon: Marcina Millard, MD;  Location: [facility];  Service: Cardiovascular;  Laterality: N/A;   CHOLECYSTECTOMY     CORONARY ANGIOPLASTY WITH STENT PLACEMENT     CORONARY STENT INTERVENTION N/A 06/13/2019   Procedure: CORONARY STENT INTERVENTION;  Surgeon: Marcina Millard, MD;  Location: [facility];  Service: Cardiovascular;  Laterality: N/A;   LEFT HEART CATH AND CORONARY ANGIOGRAPHY Left 10/07/2016   Procedure: Left Heart Cath and Coronary Angiography;  Surgeon: Lamar Blinks, MD;  Location: [facility];  Service: Cardiovascular;  Laterality: Left;   LEFT HEART CATH AND CORONARY ANGIOGRAPHY N/A 06/13/2019   Procedure: LEFT HEART CATH AND CORONARY ANGIOGRAPHY;  Surgeon: Lamar Blinks, MD;  Location: [facility];  Service: Cardiovascular;  Laterality: N/A;   SHOULDER ARTHROSCOPY WITH SUBACROMIAL DECOMPRESSION AND BICEP TENDON REPAIR Right 02/24/2018   Procedure: SHOULDER ARTHROSCOPY WITH SUBACROMIAL DEBRIDEMENT, DECOMPRESSION, ROTATOR CUFF REPAIR AND POSSIBLE BICEP TENODESIS;  Surgeon: Christena Flake, MD;  Location: [facility];  Service: Orthopedics;  Laterality: Right;   SHOULDER ARTHROSCOPY WITH SUBACROMIAL DECOMPRESSION AND BICEP TENDON REPAIR Left 04/25/2019   Procedure: SHOULDER ARTHROSCOPY WITH DEBRIDEMENT, DECOMPRESSION AND BICEP TENDON REPAIR, ROTATOR  CUFF REPAIR;  Surgeon: Christena Flake, MD;  Location: [facility];  Service: Orthopedics;  Laterality: Left;   TONSILLECTOMY      Prior to Admission medications   Medication Sig Start Date End Date Taking? Authorizing Provider  cholecalciferol (VITAMIN D3) 25 MCG (1000 UNIT) tablet Take 1,000 Units by mouth daily.   Yes [provider]  ezetimibe (ZETIA) 10 MG tablet  Take 1 tablet (10 mg total) by mouth daily. 06/15/19  Yes Lynn Ito, MD  glipiZIDE (GLUCOTROL XL) 10 MG 24 hr tablet  11/26/20  Yes [provider]  ketoconazole (NIZORAL) 2 % shampoo Apply 1 application topically 2 (two) times a week. 02/03/18  Yes [provider]  MAGNESIUM OXIDE PO Take by mouth.   Yes [provider]  MANGANESE PO Take by mouth.   Yes [provider]  Multiple Vitamin (MULTIVITAMIN PO) Take by mouth.   Yes [provider]  Multiple Vitamins-Minerals (ZINC PO) Take by mouth.   Yes [provider]  multivitamin-lutein (OCUVITE-LUTEIN) CAPS capsule Take 1 capsule by mouth daily.   Yes [provider]  nitroGLYCERIN (NITROSTAT) 0.4 MG SL tablet Take 0.4 mg by mouth every 5 (five) minutes as needed for chest pain.  03/29/14  Yes [provider]  Omega-3 Fatty Acids (FISH OIL) 1200 MG CAPS Take 2,400 mg by mouth daily.    Yes [provider]  pantoprazole (PROTONIX) 40 MG tablet Take 1 tablet (40 mg total) by mouth 2 (two) times daily before a meal. 06/14/19  Yes Lynn Ito, MD  traMADol (ULTRAM) 50 MG tablet Take 50 mg by mouth every 6 (six) hours as needed for moderate pain.   Yes [provider]  vitamin B-12 (CYANOCOBALAMIN) 1000 MCG tablet Take 1,000 mcg by mouth daily.   Yes [provider]  vitamin E 1000 UNIT capsule Take 1,000 Units by mouth daily.   Yes [provider]  aspirin 81 MG chewable tablet Chew 1 tablet (81 mg total) by mouth daily. Patient not taking: No sig reported 06/15/19   Lynn Ito, MD  losartan (COZAAR) 25 MG tablet Take 1 tablet (25 mg total) by mouth daily. Patient not taking: No sig reported 06/15/19   Lynn Ito, MD  oxyCODONE (ROXICODONE) 5 MG immediate release tablet Take 1-2 tablets (5-10 mg total) by mouth every 4 (four) hours as needed. Patient not taking: Reported on 12/20/2020 04/25/19   Poggi, Excell Seltzer, MD  PAXLOVID 20 x 150 MG & 10 x  100MG  TBPK SMARTSIG:Pack(s) By Mouth Twice Daily Patient not taking: No sig reported 11/29/20   [provider]  prasugrel (EFFIENT) 10 MG TABS tablet Take 1 tablet (10 mg total) by mouth daily. Patient not taking: No sig reported 06/15/19   06/17/19, MD    Allergies Ace inhibitors, Isosorbide, Losartan, Metoprolol tartrate, Statins, and Sulfa antibiotics  Family History  Problem Relation Age of Onset   Angina Mother     Social History Social History   Tobacco Use   Smoking status: Former    Pack years: 0.00   Smokeless tobacco: Former  Lynn Ito Use: Never used  Substance Use Topics   Alcohol use: No   Drug use: No    Review of Systems  Constitutional: No fever/chills Eyes: No visual changes. ENT: No sore throat. Cardiovascular: Positive for chest pain. Respiratory: Denies shortness of breath. Gastrointestinal: No abdominal pain.  No nausea, no vomiting.  No diarrhea.  No constipation. Genitourinary: Negative  for dysuria. Musculoskeletal: Negative for back pain. Skin: Negative for rash. Neurological: Negative for headaches, focal weakness or numbness.  ____________________________________________   PHYSICAL EXAM:  VITAL SIGNS: ED Triage Vitals  Enc Vitals Group     BP 12/20/20 0747 (!) 159/79     Pulse Rate 12/20/20 0747 97     Resp 12/20/20 0747 16     Temp 12/20/20 0747 98.4 F (36.9 C)     Temp Source 12/20/20 0747 Oral     SpO2 12/20/20 0747 99 %     Weight 12/20/20 0747 270 lb (122.5 kg)     Height 12/20/20 0747 6' (1.829 m)     Head Circumference --      Peak Flow --      Pain Score 12/20/20 0751 5     Pain Loc --      Pain Edu? --      Excl. in GC? --     Constitutional: Alert and oriented. Eyes: Conjunctivae are normal. Head: Atraumatic. Nose: No congestion/rhinnorhea. Mouth/Throat: Mucous membranes are moist. Neck: Normal ROM Cardiovascular: Normal rate, regular rhythm. Grossly normal heart sounds.  2+ radial  pulses bilaterally. Respiratory: Normal respiratory effort.  No retractions. Lungs CTAB.  No chest wall tenderness to palpation. Gastrointestinal: Soft and nontender. No distention. Genitourinary: deferred Musculoskeletal: No lower extremity tenderness nor edema. Neurologic:  Normal speech and language. No gross focal neurologic deficits are appreciated. Skin:  Skin is warm, dry and intact. No rash noted. Psychiatric: Mood and affect are normal. Speech and behavior are normal.  ____________________________________________   LABS (all labs ordered are listed, but only abnormal results are displayed)  Labs Reviewed  BASIC METABOLIC PANEL - Abnormal; Notable for the following components:      Result Value   Glucose, Bld 284 (*)    BUN 29 (*)    Creatinine, Ser 1.32 (*)    Calcium 8.8 (*)    GFR, Estimated 56 (*)    All other components within normal limits  TROPONIN I (HIGH SENSITIVITY) - Abnormal; Notable for the following components:   Troponin I (High Sensitivity) 141 (*)    All other components within normal limits  RESP PANEL BY RT-PCR (FLU A&B, COVID) ARPGX2  CBC  D-DIMER, QUANTITATIVE  APTT  PROTIME-INR  HEPARIN LEVEL (UNFRACTIONATED)  TROPONIN I (HIGH SENSITIVITY)   ____________________________________________  EKG  ED ECG REPORT I, Chesley Noonharles Bethann Qualley, the attending physician, personally viewed and interpreted this ECG.   Date: 12/20/2020  EKG Time: 7:44  Rate: 89  Rhythm: normal sinus rhythm  Axis: LAD  Intervals:left anterior fascicular block  ST&T Change: None   PROCEDURES  Procedure(s) performed (including Critical Care):  .Critical Care  Date/Time: 12/20/2020 11:23 AM Performed by: Chesley NoonJessup, Rosio Weiss, MD Authorized by: Chesley NoonJessup, Heidy Mccubbin, MD   Critical care provider statement:    Critical care time (minutes):  45   Critical care time was exclusive of:  Separately billable procedures and treating other patients and teaching time   Critical care was  necessary to treat or prevent imminent or life-threatening deterioration of the following conditions:  Cardiac failure   Critical care was time spent personally by me on the following activities:  Discussions with consultants, evaluation of patient's response to treatment, examination of patient, ordering and performing treatments and interventions, ordering and review of laboratory studies, ordering and review of radiographic studies, pulse oximetry, re-evaluation of patient's condition, obtaining history from patient or surrogate and review of old charts   I assumed direction of  critical care for this patient from another provider in my specialty: no     Care discussed with: admitting provider     ____________________________________________   INITIAL IMPRESSION / ASSESSMENT AND PLAN / ED COURSE      75 year old male with past medical history of diabetes, CAD, and CKD who presents to the ED complaining of significant chest pain in the center of his chest upon waking up this morning, has gradually eased off since then.  He had similar milder pain for the past couple of days and presentation is concerning for ACS given association with exertion and patient's significant cardiac history.  EKG shows no acute ischemic changes, we will plan to check labs including 2 sets troponin.  Patient has already received aspirin and nitroglycerin.  We will also check D-dimer given he is at high risk for PE with his recent COVID diagnosis.  Troponin mildly elevated at 141, D-dimer within normal limits so I have a low suspicion for PE.  Patient continues to complain of ongoing chest pain and we will start him on heparin.  Pain was not alleviated with IV morphine and we will start him on a nitroglycerin drip.  Dr. Lady Gary of cardiology was consulted and case discussed with hospitalist for admission.      ____________________________________________   FINAL CLINICAL IMPRESSION(S) / ED DIAGNOSES  Final diagnoses:   NSTEMI (non-ST elevated myocardial infarction) [facility])     ED Discharge Orders     None        Note:  This document was prepared using Dragon voice recognition software and may include unintentional dictation errors.    Chesley Noon, MD 12/20/20 1124

## 2020-12-20 NOTE — Consult Note (Addendum)
Cardiology Consultation Note    Patient ID: Aaron [name]., MRN: [id number], DOB/AGE: January 06, 1946 75 y.o. Admit date: 12/20/2020   Date of Consult: 12/20/2020 Primary Physician: Lonie Peak, [name] Primary Cardiologist: Dr. Gwen Pounds  Chief Complaint: chest paiin Reason for Consultation: chest pain/abnormal troponin Requesting MD: Dr. Larinda Buttery  HPI: [name]. is a 75 y.o. male with history of coronary artery disease status post PCI of the mid RCA with a drug-eluting stent in 2017.  He had moderate disease in small vessels including the diagonal and circumflex.  He underwent a repeat cardiac catheterization in April 2018 showing a patent stent in the LAD and RCA.  Medical management is recommended.  Cardiac catheterization in December 2020 revealed a 95% distal RCA stenosis and underwent PCI.  There was a patent stent in the mid RCA a.  He now returns with complaints of chest pain.  EKG is nondiagnostic.  Initial troponin was mildly elevated at 141.  Was placed on heparin given nitrates and morphine.  Hemodynamically stable.  Patient has mildly acute on chronic renal insufficiency with a creatinine of 1.32 up from 1.12.  Subsequent troponins are pending.  Patient denies of positional or pleuritic component to his chest pain.  He was diagnosed with COVID-19 approximately 2 weeks ago but had been feeling better until approximately 2 days ago.  He was more weak than he had been previously starting 2 days ago.  He is on Zetia, glipizide, aspirin, losartan, daily as an outpatient.  Past Medical History:  Diagnosis Date   Cellulitis    Diabetes mellitus without complication (HCC)    MI, old       Surgical History:  Past Surgical History:  Procedure Laterality Date   ABDOMINAL SURGERY     APPENDECTOMY     CARDIAC CATHETERIZATION Left 10/21/2015   Procedure: Left Heart Cath and Coronary Angiography;  Surgeon: Lamar Blinks, MD;  Location: [facility];  Service:  Cardiovascular;  Laterality: Left;   CARDIAC CATHETERIZATION N/A 10/21/2015   Procedure: Coronary Stent Intervention;  Surgeon: Marcina Millard, MD;  Location: [facility];  Service: Cardiovascular;  Laterality: N/A;   CHOLECYSTECTOMY     CORONARY ANGIOPLASTY WITH STENT PLACEMENT     CORONARY STENT INTERVENTION N/A 06/13/2019   Procedure: CORONARY STENT INTERVENTION;  Surgeon: Marcina Millard, MD;  Location: [facility];  Service: Cardiovascular;  Laterality: N/A;   LEFT HEART CATH AND CORONARY ANGIOGRAPHY Left 10/07/2016   Procedure: Left Heart Cath and Coronary Angiography;  Surgeon: Lamar Blinks, MD;  Location: [facility];  Service: Cardiovascular;  Laterality: Left;   LEFT HEART CATH AND CORONARY ANGIOGRAPHY N/A 06/13/2019   Procedure: LEFT HEART CATH AND CORONARY ANGIOGRAPHY;  Surgeon: Lamar Blinks, MD;  Location: [facility];  Service: Cardiovascular;  Laterality: N/A;   SHOULDER ARTHROSCOPY WITH SUBACROMIAL DECOMPRESSION AND BICEP TENDON REPAIR Right 02/24/2018   Procedure: SHOULDER ARTHROSCOPY WITH SUBACROMIAL DEBRIDEMENT, DECOMPRESSION, ROTATOR CUFF REPAIR AND POSSIBLE BICEP TENODESIS;  Surgeon: Christena Flake, MD;  Location: [facility];  Service: Orthopedics;  Laterality: Right;   SHOULDER ARTHROSCOPY WITH SUBACROMIAL DECOMPRESSION AND BICEP TENDON REPAIR Left 04/25/2019   Procedure: SHOULDER ARTHROSCOPY WITH DEBRIDEMENT, DECOMPRESSION AND BICEP TENDON REPAIR, ROTATOR CUFF REPAIR;  Surgeon: Christena Flake, MD;  Location: [facility];  Service: Orthopedics;  Laterality: Left;   TONSILLECTOMY       Home Meds: Prior to Admission medications   Medication Sig Start Date  End Date Taking? Authorizing Provider  cholecalciferol (VITAMIN D3) 25 MCG (1000 UNIT) tablet Take 1,000 Units by mouth daily.   Yes [provider]  ezetimibe (ZETIA) 10 MG tablet Take 1 tablet (10 mg total) by mouth daily. 06/15/19  Yes Lynn Ito, MD  glipiZIDE  (GLUCOTROL XL) 10 MG 24 hr tablet  11/26/20  Yes [provider]  ketoconazole (NIZORAL) 2 % shampoo Apply 1 application topically 2 (two) times a week. 02/03/18  Yes [provider]  MAGNESIUM OXIDE PO Take by mouth.   Yes [provider]  MANGANESE PO Take by mouth.   Yes [provider]  Multiple Vitamin (MULTIVITAMIN PO) Take by mouth.   Yes [provider]  Multiple Vitamins-Minerals (ZINC PO) Take by mouth.   Yes [provider]  multivitamin-lutein (OCUVITE-LUTEIN) CAPS capsule Take 1 capsule by mouth daily.   Yes [provider]  nitroGLYCERIN (NITROSTAT) 0.4 MG SL tablet Take 0.4 mg by mouth every 5 (five) minutes as needed for chest pain.  03/29/14  Yes [provider]  Omega-3 Fatty Acids (FISH OIL) 1200 MG CAPS Take 2,400 mg by mouth daily.    Yes [provider]  pantoprazole (PROTONIX) 40 MG tablet Take 1 tablet (40 mg total) by mouth 2 (two) times daily before a meal. 06/14/19  Yes Lynn Ito, MD  traMADol (ULTRAM) 50 MG tablet Take 50 mg by mouth every 6 (six) hours as needed for moderate pain.   Yes [provider]  vitamin B-12 (CYANOCOBALAMIN) 1000 MCG tablet Take 1,000 mcg by mouth daily.   Yes [provider]  vitamin E 1000 UNIT capsule Take 1,000 Units by mouth daily.   Yes [provider]  aspirin 81 MG chewable tablet Chew 1 tablet (81 mg total) by mouth daily. Patient not taking: No sig reported 06/15/19   Lynn Ito, MD  losartan (COZAAR) 25 MG tablet Take 1 tablet (25 mg total) by mouth daily. Patient not taking: No sig reported 06/15/19   Lynn Ito, MD  oxyCODONE (ROXICODONE) 5 MG immediate release tablet Take 1-2 tablets (5-10 mg total) by mouth every 4 (four) hours as needed. Patient not taking: Reported on 12/20/2020 04/25/19   Christena Flake, MD  PAXLOVID 20 x 150 MG & 10 x 100MG  TBPK SMARTSIG:Pack(s) By Mouth Twice Daily Patient not taking: No sig reported  11/29/20   [provider]           Inpatient Medications:    heparin 1,400 Units/hr (12/20/20 1053)   nitroGLYCERIN 5 mcg/min (12/20/20 1116)    Allergies:  Allergies  Allergen Reactions   Ace Inhibitors Other (See Comments)    weakness   Isosorbide     weakness   Losartan     weakness   Metoprolol Tartrate Other (See Comments)    weakness   Statins Nausea And Vomiting and Other (See Comments)    Patient states he gets real weak and headache.   Sulfa Antibiotics Other (See Comments)    Unknown childhood reaction     Social History   Socioeconomic History   Marital status: Widowed    Spouse name: Not on file   Number of children: Not on file   Years of education: Not on file   Highest education level: Not on file  Occupational History   Not on file  Tobacco Use   Smoking status: Former    Pack years: 0.00   Smokeless tobacco: Former  02/20/21  Vaping Use: Never used  Substance and Sexual Activity   Alcohol use: No   Drug use: No   Sexual activity: Not on file  Other Topics Concern   Not on file  Social History Narrative   Not on file   Social Determinants of Health   Financial Resource Strain: Not on file  Food Insecurity: Not on file  Transportation Needs: Not on file  Physical Activity: Not on file  Stress: Not on file  Social Connections: Not on file  Intimate Partner Violence: Not on file     Family History  Problem Relation Age of Onset   Angina Mother      Review of Systems: A 12-system review of systems was performed and is negative except as noted in the HPI.  Labs: No results for input(s): CKTOTAL, CKMB, TROPONINI in the last 72 hours. Lab Results  Component Value Date   WBC 9.0 12/20/2020   HGB 13.9 12/20/2020   HCT 40.0 12/20/2020   MCV 89.9 12/20/2020   PLT 166 12/20/2020    Recent Labs  Lab 12/20/20 0749  NA 135  K 4.8  CL 102  CO2 26  BUN 29*  CREATININE 1.32*  CALCIUM 8.8*  GLUCOSE 284*   Lab  Results  Component Value Date   CHOL 148 06/13/2019   HDL 25 (L) 06/13/2019   LDLCALC UNABLE TO CALCULATE IF TRIGLYCERIDE OVER 400 mg/dL 16/10/960412/22/2020   TRIG 540505 (H) 06/13/2019   Lab Results  Component Value Date   DDIMER 0.40 12/20/2020    Radiology/Studies:  DG Chest 2 View  Result Date: 12/20/2020 CLINICAL DATA:  Acute chest pain EXAM: CHEST - 2 VIEW COMPARISON:  06/12/2019 FINDINGS: The cardiomediastinal silhouette is unremarkable. There is no evidence of focal airspace disease, pulmonary edema, suspicious pulmonary nodule/mass, pleural effusion, or pneumothorax. No acute bony abnormalities are identified. IMPRESSION: No active cardiopulmonary disease. Electronically Signed   By: Harmon PierJeffrey  Hu M.D.   On: 12/20/2020 08:30    Wt Readings from Last 3 Encounters:  12/20/20 122.5 kg  06/14/19 108.1 kg  04/18/19 111.1 kg    EKG: Normal sinus rhythm with nonspecific ST-T wave changes.  Physical Exam:  Blood pressure (!) 148/78, pulse 78, temperature 98.4 F (36.9 C), temperature source Oral, resp. rate 12, height 6' (1.829 m), weight 122.5 kg, SpO2 98 %. Body mass index is 36.62 kg/m. General: Well developed, well nourished, in no acute distress. Head: Normocephalic, atraumatic, sclera non-icteric, no xanthomas, nares are without discharge.  Neck: Negative for carotid bruits. JVD not elevated. Lungs: Clear bilaterally to auscultation without wheezes, rales, or rhonchi. Breathing is unlabored. Heart: RRR with S1 S2. No murmurs, rubs, or gallops appreciated. Abdomen: Soft, non-tender, non-distended with normoactive bowel sounds. No hepatomegaly. No rebound/guarding. No obvious abdominal masses. Msk:  Strength and tone appear normal for age. Extremities: No clubbing or cyanosis. No edema.  Distal pedal pulses are 2+ and equal bilaterally. Neuro: Alert and oriented X 3. No facial asymmetry. No focal deficit. Moves all extremities spontaneously. Psych:  Responds to questions appropriately  with a normal affect.     Assessment and Plan  75 year old male with coronary disease status post PCI of the RCA x2 now presenting with chest pain with both typical atypical features.  EKG nondiagnostic.  Is on Zetia and aspirin .  Is statin intolerant.  Initial troponin mildly elevated.  Awaiting subsequent 1.  We will continue with heparin, as needed morphine and depending on clinical course, symptoms and troponin, will  consider medical management versus relook cardiac catheterization.  Signed, Dalia Heading MD 12/20/2020, 11:21 AM Pager: [phone]   Still having chest pain with rising troponin. Will proceed with cardiac cath with possible pci this afternoon.

## 2020-12-20 NOTE — ED Triage Notes (Signed)
BIB [facility] reference [facility] from home. HX of same. started at 0530. Intermittent. 325 of ASA and 3 NTG priot to EMS relief. EKG normal  HR 92 BP 142/89 RR 16 98% RA  BGL 275 20 G IV LFA

## 2020-12-20 NOTE — ED Notes (Signed)
[facility] called for report.  °

## 2020-12-21 DIAGNOSIS — I5022 Chronic systolic (congestive) heart failure: Secondary | ICD-10-CM | POA: Diagnosis not present

## 2020-12-21 DIAGNOSIS — E1122 Type 2 diabetes mellitus with diabetic chronic kidney disease: Secondary | ICD-10-CM | POA: Diagnosis not present

## 2020-12-21 DIAGNOSIS — Z955 Presence of coronary angioplasty implant and graft: Secondary | ICD-10-CM | POA: Diagnosis not present

## 2020-12-21 DIAGNOSIS — Z882 Allergy status to sulfonamides status: Secondary | ICD-10-CM | POA: Diagnosis not present

## 2020-12-21 DIAGNOSIS — I214 Non-ST elevation (NSTEMI) myocardial infarction: Principal | ICD-10-CM

## 2020-12-21 DIAGNOSIS — Z7984 Long term (current) use of oral hypoglycemic drugs: Secondary | ICD-10-CM | POA: Diagnosis not present

## 2020-12-21 DIAGNOSIS — R079 Chest pain, unspecified: Secondary | ICD-10-CM | POA: Diagnosis not present

## 2020-12-21 DIAGNOSIS — I25110 Atherosclerotic heart disease of native coronary artery with unstable angina pectoris: Secondary | ICD-10-CM | POA: Diagnosis not present

## 2020-12-21 DIAGNOSIS — N1831 Chronic kidney disease, stage 3a: Secondary | ICD-10-CM

## 2020-12-21 DIAGNOSIS — Z7902 Long term (current) use of antithrombotics/antiplatelets: Secondary | ICD-10-CM | POA: Diagnosis not present

## 2020-12-21 DIAGNOSIS — I252 Old myocardial infarction: Secondary | ICD-10-CM | POA: Diagnosis not present

## 2020-12-21 DIAGNOSIS — R749 Abnormal serum enzyme level, unspecified: Secondary | ICD-10-CM | POA: Diagnosis not present

## 2020-12-21 DIAGNOSIS — R072 Precordial pain: Secondary | ICD-10-CM | POA: Diagnosis not present

## 2020-12-21 DIAGNOSIS — Z87891 Personal history of nicotine dependence: Secondary | ICD-10-CM | POA: Diagnosis not present

## 2020-12-21 DIAGNOSIS — E785 Hyperlipidemia, unspecified: Secondary | ICD-10-CM | POA: Diagnosis not present

## 2020-12-21 DIAGNOSIS — Z8616 Personal history of COVID-19: Secondary | ICD-10-CM | POA: Diagnosis not present

## 2020-12-21 DIAGNOSIS — Z20822 Contact with and (suspected) exposure to covid-19: Secondary | ICD-10-CM | POA: Diagnosis not present

## 2020-12-21 DIAGNOSIS — Z79899 Other long term (current) drug therapy: Secondary | ICD-10-CM | POA: Diagnosis not present

## 2020-12-21 DIAGNOSIS — Z888 Allergy status to other drugs, medicaments and biological substances status: Secondary | ICD-10-CM | POA: Diagnosis not present

## 2020-12-21 DIAGNOSIS — K219 Gastro-esophageal reflux disease without esophagitis: Secondary | ICD-10-CM | POA: Diagnosis present

## 2020-12-21 DIAGNOSIS — I130 Hypertensive heart and chronic kidney disease with heart failure and stage 1 through stage 4 chronic kidney disease, or unspecified chronic kidney disease: Secondary | ICD-10-CM | POA: Diagnosis not present

## 2020-12-21 DIAGNOSIS — I2510 Atherosclerotic heart disease of native coronary artery without angina pectoris: Secondary | ICD-10-CM | POA: Diagnosis not present

## 2020-12-21 DIAGNOSIS — Z7982 Long term (current) use of aspirin: Secondary | ICD-10-CM | POA: Diagnosis not present

## 2020-12-21 LAB — BASIC METABOLIC PANEL
Anion gap: 10 (ref 5–15)
BUN: 20 mg/dL (ref 8–23)
CO2: 24 mmol/L (ref 22–32)
Calcium: 8.3 mg/dL — ABNORMAL LOW (ref 8.9–10.3)
Chloride: 101 mmol/L (ref 98–111)
Creatinine, Ser: 0.97 mg/dL (ref 0.61–1.24)
GFR, Estimated: >60 mL/min (ref >60)
Glucose, Bld: 171 mg/dL — ABNORMAL HIGH (ref 70–99)
Potassium: 3.9 mmol/L (ref 3.5–5.1)
Sodium: 135 mmol/L (ref 135–145)

## 2020-12-21 LAB — LIPID PANEL
Cholesterol: 136 mg/dL (ref 0–200)
HDL: 24 mg/dL — ABNORMAL LOW (ref >40)
LDL Cholesterol: UNDETERMINED mg/dL (ref 0–99)
Total CHOL/HDL Ratio: 5.7 RATIO
Triglycerides: 529 mg/dL — ABNORMAL HIGH (ref <150)
VLDL: UNDETERMINED mg/dL (ref 0–40)

## 2020-12-21 LAB — CBC
HCT: 37.8 % — ABNORMAL LOW (ref 39.0–52.0)
Hemoglobin: 13.1 g/dL (ref 13.0–17.0)
MCH: 31.4 pg (ref 26.0–34.0)
MCHC: 34.7 g/dL (ref 30.0–36.0)
MCV: 90.6 fL (ref 80.0–100.0)
Platelets: 156 10*3/uL (ref 150–400)
RBC: 4.17 MIL/uL — ABNORMAL LOW (ref 4.22–5.81)
RDW: 15.1 % (ref 11.5–15.5)
WBC: 11 10*3/uL — ABNORMAL HIGH (ref 4.0–10.5)
nRBC: 0 % (ref 0.0–0.2)

## 2020-12-21 LAB — GLUCOSE, CAPILLARY: Glucose-Capillary: 190 mg/dL — ABNORMAL HIGH (ref 70–99)

## 2020-12-21 LAB — TROPONIN I (HIGH SENSITIVITY): Troponin I (High Sensitivity): 16649 ng/L (ref <18)

## 2020-12-21 MED ORDER — TICAGRELOR 90 MG PO TABS: 90.0000 mg | ORAL_TABLET | Freq: Two times a day (BID) | ORAL | 0 refills | Status: DC

## 2020-12-21 MED ORDER — CARVEDILOL 3.125 MG PO TABS: 3.1250 mg | ORAL_TABLET | Freq: Two times a day (BID) | ORAL | 0 refills | Status: DC

## 2020-12-21 NOTE — Discharge Summary (Signed)
Physician Discharge Summary  Patient ID: Aaron [name]. MRN: [id number] DOB/AGE: March 07, 1946 75 y.o.  Admit date: 12/20/2020 Discharge date: 12/21/2020  Admission Diagnoses:  Discharge Diagnoses:  Principal Problem:   NSTEMI (non-ST elevated myocardial infarction) (HCC) Active Problems:   CAD (coronary artery disease)   CKD (chronic kidney disease), stage IIIa   Type II diabetes mellitus with renal manifestations (HCC)   HLD (hyperlipidemia)   GERD (gastroesophageal reflux disease)   Chronic systolic CHF (congestive heart failure) (HCC)   Discharged Condition: good  Hospital Course:  Aaron Stanforth. is a 75 y.o. male with medical history significant of hypertension, hyperlipidemia, diabetes mellitus, CAD, stent placement, CKD-3A, sCHF with EF of 40%, who presents with chest [name]. Is diagnosed with a non-STEMI with troponin more than 24,000.  He was brought to the Cath Lab by Dr. Bascom Levels, performed angiogram on 12/20/2020. Drug-eluting stents are placed into LAD and circumflex. Patient is treated with aspirin and Brilinta. Since surgery, patient has been doing well, no additional chest [name].  No short of breath.  He has been seen by Dr. Lady Gary again this morning, he is medically stable to be discharged.  He will be followed up with cardiology as well as PCP in the near future.    Consults: cardiology Heart cath: 12/20/2020 Significant Diagnostic Studies:   There is mild left ventricular systolic dysfunction. LV end diastolic pressure is mildly elevated. The left ventricular ejection fraction is 45-50% by visual estimate. Mid LM to Dist LM lesion is 20% stenosed. Ost LAD lesion is 30% stenosed. Mid LAD lesion is 10% stenosed. Mid LAD to Dist LAD lesion is 30% stenosed. Prox Cx to Mid Cx lesion is 60% stenosed. Mid Cx to Dist Cx lesion is 99% stenosed. A drug-eluting stent was successfully placed using a STENT RESOLUTE ONYX [id number]. Post intervention, there is a 0%  residual stenosis. Post intervention, there is a 0% residual stenosis. Prox LAD to Mid LAD lesion is 80% stenosed. Post intervention, there is a 0% residual stenosis. A drug-eluting stent was successfully placed using a STENT RESOLUTE ONYX 3.0X12. Prox RCA lesion is 50% stenosed. Prox RCA to Mid RCA lesion is 10% stenosed. Previously placed Dist RCA stent (unknown type) is widely patent.   1.  Significant underlying three-vessel coronary artery disease with patent RCA stents with minimal restenosis.  There is moderate proximal RCA stenosis.  The mid LAD stent is patent but there is significant stenosis proximal to the stent.  In addition, there is severe mid left circumflex disease which seems to be the culprit for non-ST elevation myocardial infarction. 2.  Mildly reduced LV systolic function with mildly elevated left ventricular end-diastolic pressure. 3.  Successful angioplasty and drug-eluting stent placement to the left circumflex as well as LAD.   Recommendations: Dual antiplatelet therapy for at least 12 years and consider longer given multiple stents. Aggressive treatment of risk factors.  Consider alternative hyperlipidemia therapy given intolerance to statins. Will start small dose carvedilol for elevated blood pressure. The patient will be continued on nitroglycerin drip until his chest [name] is resolved. Possible discharge home tomorrow if stable.    Treatments: Heparin, aspirin, brillinta  Discharge Exam: Blood pressure (!) 146/79, pulse 85, temperature 98.4 F (36.9 C), temperature source Oral, resp. rate 18, height 6' (1.829 m), weight 109.9 kg, SpO2 100 %. General appearance: alert and cooperative Resp: clear to auscultation bilaterally Cardio: regular rate and rhythm, S1, S2 normal, no murmur, click, rub or gallop GI: soft, non-tender; bowel  sounds normal; no masses,  no organomegaly Extremities: extremities normal, atraumatic, no cyanosis or edema  Disposition:  Discharge disposition: 01-Home or Self Care       Discharge Instructions     AMB Referral to Cardiac Rehabilitation - Phase II   Complete by: As directed    Diagnosis:  NSTEMI Coronary Stents     After initial evaluation and assessments completed: Virtual Based Care may be provided alone or in conjunction with Phase 2 Cardiac Rehab based on patient barriers.: Yes   Diet - low sodium heart healthy   Complete by: As directed    Increase activity slowly   Complete by: As directed       Allergies as of 12/21/2020       Reactions   Ace Inhibitors Other (See Comments)   weakness   Isosorbide    weakness   Losartan    weakness   Metoprolol Tartrate Other (See Comments)   weakness   Statins Nausea And Vomiting, Other (See Comments)   Patient states he gets real weak and headache.   Sulfa Antibiotics Other (See Comments)   Unknown childhood reaction         Medication List     STOP taking these medications    losartan 25 MG tablet Commonly known as: COZAAR   oxyCODONE 5 MG immediate release tablet Commonly known as: Roxicodone   Paxlovid 20 x 150 MG & 10 x 100MG  Tbpk Generic drug: Nirmatrelvir & Ritonavir   prasugrel 10 MG Tabs tablet Commonly known as: EFFIENT   vitamin E 1000 UNIT capsule   ZINC PO       TAKE these medications    aspirin 81 MG chewable tablet Chew 1 tablet (81 mg total) by mouth daily.   carvedilol 3.125 MG tablet Commonly known as: COREG Take 1 tablet (3.125 mg total) by mouth 2 (two) times daily with a meal.   cholecalciferol 25 MCG (1000 UNIT) tablet Commonly known as: VITAMIN D3 Take 1,000 Units by mouth daily.   ezetimibe 10 MG tablet Commonly known as: ZETIA Take 1 tablet (10 mg total) by mouth daily.   Fish Oil 1200 MG Caps Take 2,400 mg by mouth daily.   glipiZIDE 10 MG 24 hr tablet Commonly known as: GLUCOTROL XL   ketoconazole 2 % shampoo Commonly known as: NIZORAL Apply 1 application topically 2 (two) times a  week.   MAGNESIUM OXIDE PO Take by mouth.   MANGANESE PO Take by mouth.   MULTIVITAMIN PO Take by mouth.   multivitamin-lutein Caps capsule Take 1 capsule by mouth daily.   nitroGLYCERIN 0.4 MG SL tablet Commonly known as: NITROSTAT Take 0.4 mg by mouth every 5 (five) minutes as needed for chest [name].   pantoprazole 40 MG tablet Commonly known as: PROTONIX Take 1 tablet (40 mg total) by mouth 2 (two) times daily before a meal.   ticagrelor 90 MG Tabs tablet Commonly known as: BRILINTA Take 1 tablet (90 mg total) by mouth 2 (two) times daily.   traMADol 50 MG tablet Commonly known as: ULTRAM Take 50 mg by mouth every 6 (six) hours as needed for moderate [name].   vitamin B-12 1000 MCG tablet Commonly known as: CYANOCOBALAMIN Take 1,000 mcg by mouth daily.        Follow-up Information     , MD Follow up in 1 week(s).   Specialty: Cardiology Contact information: [address] [facility] [place] [phone]  Lonie Peak, [name] Follow up in 1 week(s).   Specialty: Physician Assistant Contact information: [address] [place] [address] [phone]                 Signed: Marrion Coy 12/21/2020, 10:01 AM

## 2020-12-21 NOTE — Progress Notes (Signed)
Patient Name: Aaron [name]. Date of Encounter: 12/21/2020  Hospital Problem List     Principal Problem:   NSTEMI (non-ST elevated myocardial infarction) [facility]) Active Problems:   CAD (coronary artery disease)   CKD (chronic kidney disease), stage IIIa   Type II diabetes mellitus with renal manifestations (HCC)   HLD (hyperlipidemia)   GERD (gastroesophageal reflux disease)   Chronic systolic CHF (congestive heart failure) [facility])    Patient Profile     75 y.o. male with history of coronary artery disease status post PCI of the mid RCA with a drug-eluting stent in 2017.  He had moderate disease in small vessels including the diagonal and circumflex.  He underwent a repeat cardiac catheterization in April 2018 showing a patent stent in the LAD and RCA.  Medical management is recommended.  Cardiac catheterization in December 2020 revealed a 95% distal RCA stenosis and underwent PCI.  There was a patent stent in the mid RCA a.  He now returns with complaints of chest pain.  EKG is nondiagnostic.  Initial troponin was mildly elevated at 141.  Was placed on heparin given nitrates and morphine.  Hemodynamically stable.  Patient has mildly acute on chronic renal insufficiency with a creatinine of 1.32 up from 1.12.  Troponins peaked at 24,000 currently 16,649.  Patient denies of positional or pleuritic component to his chest pain.  He was diagnosed with COVID-19 approximately 2 weeks ago but had been feeling better until approximately 2 days ago.  He was more weak than he had been previously starting 2 days ago.  He is on Zetia, glipizide, aspirin, losartan, daily as an outpatient.  Subjective   No chest pain.  Radial cath site clean and dry.  Inpatient Medications     aspirin  81 mg Oral Daily   carvedilol  3.125 mg Oral BID WC   cholecalciferol  1,000 Units Oral Daily   enoxaparin (LOVENOX) injection  0.5 mg/kg Subcutaneous Q24H   ezetimibe  10 mg Oral Daily   insulin aspart  0-5  Units Subcutaneous QHS   insulin aspart  0-9 Units Subcutaneous TID WC   magnesium oxide  400 mg Oral Daily   multivitamin with minerals  1 tablet Oral Daily   multivitamin-lutein  1 capsule Oral Daily   omega-3 acid ethyl esters  1 g Oral BID   pantoprazole  40 mg Oral BID AC   sodium chloride flush  3 mL Intravenous Q12H   sodium chloride flush  3 mL Intravenous Q12H   sodium chloride flush  3 mL Intravenous Q12H   ticagrelor  90 mg Oral BID   vitamin B-12  1,000 mcg Oral Daily   Vitamin E  1,200 Units Oral Daily   zinc sulfate  220 mg Oral Daily    Vital Signs    Vitals:   12/20/20 2010 12/21/20 0235 12/21/20 0438 12/21/20 0730  BP: 130/82  (!) 146/73 (!) 146/79  Pulse: 82  75 85  Resp: 16  20 18   Temp:   97.6 F (36.4 C) 98.4 F (36.9 C)  TempSrc:    Oral  SpO2: 100%  100% 100%  Weight:  109.9 kg    Height:        Intake/Output Summary (Last 24 hours) at 12/21/2020 0920 Last data filed at 12/21/2020 0730 Gross per 24 hour  Intake 1325.27 ml  Output 965 ml  Net 360.27 ml   Filed Weights   12/20/20 1341 12/20/20 1530 12/21/20 0235  Weight:  122.5 kg 112.5 kg 109.9 kg    Physical Exam    GEN: Well nourished, well developed, in no acute distress.  HEENT: normal.  Neck: Supple, no JVD, carotid bruits, or masses. Cardiac: RRR, no murmurs, rubs, or gallops. No clubbing, cyanosis, edema.  Radials/DP/PT 2+ and equal bilaterally.  Respiratory:  Respirations regular and unlabored, clear to auscultation bilaterally. GI: Soft, nontender, nondistended, BS + x 4. MS: no deformity or atrophy. Skin: warm and dry, no rash. Neuro:  Strength and sensation are intact. Psych: Normal affect.  Labs    CBC Recent Labs    12/20/20 1957 12/21/20 0510  WBC 10.4 11.0*  HGB 13.2 13.1  HCT 38.5* 37.8*  MCV 90.4 90.6  PLT 166 156   Basic Metabolic Panel Recent Labs    48/54/62 0749 12/20/20 1957 12/21/20 0510  NA 135  --  135  K 4.8  --  3.9  CL 102  --  101  CO2 26  --   24  GLUCOSE 284*  --  171*  BUN 29*  --  20  CREATININE 1.32* 1.20 0.97  CALCIUM 8.8*  --  8.3*   Liver Function Tests No results for input(s): AST, ALT, ALKPHOS, BILITOT, PROT, ALBUMIN in the last 72 hours. No results for input(s): LIPASE, AMYLASE in the last 72 hours. Cardiac Enzymes No results for input(s): CKTOTAL, CKMB, CKMBINDEX, TROPONINI in the last 72 hours. BNP Recent Labs    12/20/20 0749  BNP 23.6   D-Dimer Recent Labs    12/20/20 0746  DDIMER 0.40   Hemoglobin A1C No results for input(s): HGBA1C in the last 72 hours. Fasting Lipid Panel Recent Labs    12/21/20 0510  CHOL 136  HDL 24*  LDLCALC UNABLE TO CALCULATE IF TRIGLYCERIDE OVER 400 mg/dL  TRIG 703*  CHOLHDL 5.7   Thyroid Function Tests No results for input(s): TSH, T4TOTAL, T3FREE, THYROIDAB in the last 72 hours.  Invalid input(s): FREET3  Telemetry    Normal sinus rhythm  ECG    Normal sinus rhythm with nonspecific ST-T wave changes  Radiology    DG Chest 2 View  Result Date: 12/20/2020 CLINICAL DATA:  Acute chest pain EXAM: CHEST - 2 VIEW COMPARISON:  06/12/2019 FINDINGS: The cardiomediastinal silhouette is unremarkable. There is no evidence of focal airspace disease, pulmonary edema, suspicious pulmonary nodule/mass, pleural effusion, or pneumothorax. No acute bony abnormalities are identified. IMPRESSION: No active cardiopulmonary disease. Electronically Signed   By: Harmon Pier M.D.   On: 12/20/2020 08:30   CARDIAC CATHETERIZATION  Result Date: 12/20/2020  There is mild left ventricular systolic dysfunction.  LV end diastolic pressure is mildly elevated.  The left ventricular ejection fraction is 45-50% by visual estimate.  Mid LM to Dist LM lesion is 20% stenosed.  Ost LAD lesion is 30% stenosed.  Mid LAD lesion is 10% stenosed.  Mid LAD to Dist LAD lesion is 30% stenosed.  Prox Cx to Mid Cx lesion is 60% stenosed.  Mid Cx to Dist Cx lesion is 99% stenosed.  A drug-eluting stent  was successfully placed using a STENT RESOLUTE ONYX [id number].  Post intervention, there is a 0% residual stenosis.  Post intervention, there is a 0% residual stenosis.  Prox LAD to Mid LAD lesion is 80% stenosed.  Post intervention, there is a 0% residual stenosis.  A drug-eluting stent was successfully placed using a STENT RESOLUTE ONYX 3.0X12.  Prox RCA lesion is 50% stenosed.  Prox RCA to Mid RCA lesion is  10% stenosed.  Previously placed Dist RCA stent (unknown type) is widely patent.  1.  Significant underlying three-vessel coronary artery disease with patent RCA stents with minimal restenosis.  There is moderate proximal RCA stenosis.  The mid LAD stent is patent but there is significant stenosis proximal to the stent.  In addition, there is severe mid left circumflex disease which seems to be the culprit for non-ST elevation myocardial infarction. 2.  Mildly reduced LV systolic function with mildly elevated left ventricular end-diastolic pressure. 3.  Successful angioplasty and drug-eluting stent placement to the left circumflex as well as LAD. Recommendations: Dual antiplatelet therapy for at least 12 years and consider longer given multiple stents. Aggressive treatment of risk factors.  Consider alternative hyperlipidemia therapy given intolerance to statins. Will start small dose carvedilol for elevated blood pressure. The patient will be continued on nitroglycerin drip until his chest pain is resolved. Possible discharge home tomorrow if stable.    Assessment & Plan    75 year old male with history of coronary disease status post PCI of the mid and distal RCA as well as a previous LAD stent who presented with unstable angina.  Ruled in for non-ST elevation myocardial infarction.  Underwent left heart cath via radial approach revealing 99% OM1 felt to be the infarct-related vessel with an 80% stenosis in the proximal LAD.  Underwent PCI with drug-eluting stents in the left circumflex and LAD.   Doing well post PCI.  Tolerating Brilinta well.  Renal function stable post PCI.  Able for discharge.  Would discharge on Brilinta 90 mg twice daily, enteric-coated aspirin 81 mg daily, ezetimibe 10 mg daily (statin intolerant) carvedilol 3.125 mg twice daily.  Cardiac rehab phase 2 recommended.  We will follow-up in our office in 1 week.  Signed, Darlin Priestly Jacinta Penalver MD 12/21/2020, 9:20 AM  Pager: [phone]

## 2020-12-22 LAB — LDL CHOLESTEROL, DIRECT: Direct LDL: 49.6 mg/dL (ref 0–99)

## 2020-12-24 ENCOUNTER — Encounter: Payer: Self-pay | Admitting: Cardiovascular Disease

## 2020-12-24 LAB — HEMOGLOBIN A1C
Hgb A1c MFr Bld: 8.1 % — ABNORMAL HIGH (ref 4.8–5.6)
Mean Plasma Glucose: 186 mg/dL

## 2020-12-26 DIAGNOSIS — I25118 Atherosclerotic heart disease of native coronary artery with other forms of angina pectoris: Secondary | ICD-10-CM | POA: Diagnosis not present

## 2020-12-26 DIAGNOSIS — E782 Mixed hyperlipidemia: Secondary | ICD-10-CM | POA: Diagnosis not present

## 2020-12-26 DIAGNOSIS — I10 Essential (primary) hypertension: Secondary | ICD-10-CM | POA: Diagnosis not present

## 2020-12-26 DIAGNOSIS — I6523 Occlusion and stenosis of bilateral carotid arteries: Secondary | ICD-10-CM | POA: Diagnosis not present

## 2020-12-26 DIAGNOSIS — I255 Ischemic cardiomyopathy: Secondary | ICD-10-CM | POA: Diagnosis not present

## 2021-01-08 ENCOUNTER — Other Ambulatory Visit: Payer: Self-pay

## 2021-01-08 ENCOUNTER — Encounter: Payer: Medicare HMO | Attending: Internal Medicine | Admitting: *Deleted

## 2021-01-08 DIAGNOSIS — Z955 Presence of coronary angioplasty implant and graft: Secondary | ICD-10-CM | POA: Insufficient documentation

## 2021-01-08 DIAGNOSIS — I214 Non-ST elevation (NSTEMI) myocardial infarction: Secondary | ICD-10-CM | POA: Insufficient documentation

## 2021-01-08 NOTE — Progress Notes (Signed)
Completed virtual orientation today.  EP evaluation is scheduled for Wednesday 01/15/21 at 930am.  Documentation for diagnosis can be found in [facility] encounter 12/20/20.

## 2021-01-15 ENCOUNTER — Other Ambulatory Visit: Payer: Self-pay

## 2021-01-15 ENCOUNTER — Encounter: Payer: Medicare HMO | Admitting: *Deleted

## 2021-01-15 VITALS — Ht 71.6 in | Wt 244.8 lb

## 2021-01-15 DIAGNOSIS — Z955 Presence of coronary angioplasty implant and graft: Secondary | ICD-10-CM | POA: Diagnosis not present

## 2021-01-15 DIAGNOSIS — I214 Non-ST elevation (NSTEMI) myocardial infarction: Secondary | ICD-10-CM | POA: Diagnosis not present

## 2021-01-15 NOTE — Patient Instructions (Signed)
Patient Instructions  Patient Details  Name: Aaron [name]. MRN: [id number] Date of Birth: [birth year] Referring Provider:  Lamar Blinks, MD  Below are your personal goals for exercise, nutrition, and risk factors. Our goal is to help you stay on track towards obtaining and maintaining these goals. We will be discussing your progress on these goals with you throughout the program.  Initial Exercise Prescription:  Initial Exercise Prescription - 01/15/21 1100       Date of Initial Exercise RX and Referring Provider   Date 01/15/21    Referring Provider Arnoldo Hooker MD      Treadmill   MPH 1.5    Grade 0.5    Minutes 15    METs 2.25      Recumbant Elliptical   Level 1    RPM 50    Minutes 15    METs 2      REL-XR   Level 1    Speed 50    Minutes 15    METs 2      Prescription Details   Frequency (times per week) 3    Duration Progress to 30 minutes of continuous aerobic without signs/symptoms of physical distress      Intensity   THRR 40-80% of Max Heartrate 107-132    Ratings of Perceived Exertion 11-13    Perceived Dyspnea 0-4      Progression   Progression Continue to progress workloads to maintain intensity without signs/symptoms of physical distress.      Resistance Training   Training Prescription Yes    Weight 4 lb    Reps 10-15             Exercise Goals: Frequency: Be able to perform aerobic exercise two to three times per week in program working toward 2-5 days per week of home exercise.  Intensity: Work with a perceived exertion of 11 (fairly light) - 15 (hard) while following your exercise prescription.  We will make changes to your prescription with you as you progress through the program.   Duration: Be able to do 30 to 45 minutes of continuous aerobic exercise in addition to a 5 minute warm-up and a 5 minute cool-down routine.   Nutrition Goals: Your personal nutrition goals will be established when you do your nutrition  analysis with the dietician.  The following are general nutrition guidelines to follow: Cholesterol < 200mg /day Sodium < 1500mg /day Fiber: Men over 50 yrs - 30 grams per day  Personal Goals:  Personal Goals and Risk Factors at Admission - 01/15/21 1147       Core Components/Risk Factors/Patient Goals on Admission    Weight Management Yes;Weight Loss;Obesity    Intervention Weight Management: Develop a combined nutrition and exercise program designed to reach desired caloric intake, while maintaining appropriate intake of nutrient and fiber, sodium and fats, and appropriate energy expenditure required for the weight goal.;Weight Management: Provide education and appropriate resources to help participant work on and attain dietary goals.;Weight Management/Obesity: Establish reasonable short term and long term weight goals.;Obesity: Provide education and appropriate resources to help participant work on and attain dietary goals.    Admit Weight 244 lb 12.8 oz (111 kg)    Goal Weight: Short Term 240 lb (108.9 kg)    Goal Weight: Long Term 230 lb (104.3 kg)    Expected Outcomes Short Term: Continue to assess and modify interventions until short term weight is achieved;Long Term: Adherence to nutrition and physical activity/exercise program aimed toward  attainment of established weight goal;Weight Loss: Understanding of general recommendations for a balanced deficit meal plan, which promotes 1-2 lb weight loss per week and includes a negative energy balance of [phone] kcal/d;Understanding of distribution of calorie intake throughout the day with the consumption of 4-5 meals/snacks;Understanding recommendations for meals to include 15-35% energy as protein, 25-35% energy from fat, 35-60% energy from carbohydrates, less than 200mg  of dietary cholesterol, 20-35 gm of total fiber daily    Diabetes Yes    Intervention Provide education about signs/symptoms and action to take for hypo/hyperglycemia.;Provide  education about proper nutrition, including hydration, and aerobic/resistive exercise prescription along with prescribed medications to achieve blood glucose in normal ranges: Fasting glucose 65-99 mg/dL    Expected Outcomes Short Term: Participant verbalizes understanding of the signs/symptoms and immediate care of hyper/hypoglycemia, proper foot care and importance of medication, aerobic/resistive exercise and nutrition plan for blood glucose control.;Long Term: Attainment of HbA1C < 7%.    Heart Failure Yes    Intervention Provide a combined exercise and nutrition program that is supplemented with education, support and counseling about heart failure. Directed toward relieving symptoms such as shortness of breath, decreased exercise tolerance, and extremity edema.    Expected Outcomes Improve functional capacity of life;Short term: Attendance in program 2-3 days a week with increased exercise capacity. Reported lower sodium intake. Reported increased fruit and vegetable intake. Reports medication compliance.;Short term: Daily weights obtained and reported for increase. Utilizing diuretic protocols set by physician.;Long term: Adoption of self-care skills and reduction of barriers for early signs and symptoms recognition and intervention leading to self-care maintenance.    Hypertension Yes    Intervention Provide education on lifestyle modifcations including regular physical activity/exercise, weight management, moderate sodium restriction and increased consumption of fresh fruit, vegetables, and low fat dairy, alcohol moderation, and smoking cessation.;Monitor prescription use compliance.    Expected Outcomes Short Term: Continued assessment and intervention until BP is < 140/36mm HG in hypertensive participants. < 130/65mm HG in hypertensive participants with diabetes, heart failure or chronic kidney disease.;Long Term: Maintenance of blood pressure at goal levels.    Lipids Yes    Intervention Provide  education and support for participant on nutrition & aerobic/resistive exercise along with prescribed medications to achieve LDL 70mg , HDL >40mg .    Expected Outcomes Long Term: Cholesterol controlled with medications as prescribed, with individualized exercise RX and with personalized nutrition plan. Value goals: LDL < 70mg , HDL > 40 mg.;Short Term: Participant states understanding of desired cholesterol values and is compliant with medications prescribed. Participant is following exercise prescription and nutrition guidelines.             Tobacco Use Initial Evaluation: Social History   Tobacco Use  Smoking Status Former  Smokeless Tobacco Former  Tobacco Comments   7/20 quit 35 years ago    Exercise Goals and Review:  Exercise Goals     Row Name 01/15/21 1145             Exercise Goals   Increase Physical Activity Yes       Intervention Provide advice, education, support and counseling about physical activity/exercise needs.;Develop an individualized exercise prescription for aerobic and resistive training based on initial evaluation findings, risk stratification, comorbidities and participant's personal goals.       Expected Outcomes Short Term: Attend rehab on a regular basis to increase amount of physical activity.;Long Term: Add in home exercise to make exercise part of routine and to increase amount of physical activity.;Long Term: Exercising  regularly at least 3-5 days a week.       Increase Strength and Stamina Yes       Intervention Provide advice, education, support and counseling about physical activity/exercise needs.;Develop an individualized exercise prescription for aerobic and resistive training based on initial evaluation findings, risk stratification, comorbidities and participant's personal goals.       Expected Outcomes Short Term: Increase workloads from initial exercise prescription for resistance, speed, and METs.;Short Term: Perform resistance training  exercises routinely during rehab and add in resistance training at home;Long Term: Improve cardiorespiratory fitness, muscular endurance and strength as measured by increased METs and functional capacity ( )       Able to understand and use rate of perceived exertion (RPE) scale Yes       Intervention Provide education and explanation on how to use RPE scale       Expected Outcomes Short Term: Able to use RPE daily in rehab to express subjective intensity level;Long Term:  Able to use RPE to guide intensity level when exercising independently       Able to understand and use Dyspnea scale Yes       Intervention Provide education and explanation on how to use Dyspnea scale       Expected Outcomes Short Term: Able to use Dyspnea scale daily in rehab to express subjective sense of shortness of breath during exertion;Long Term: Able to use Dyspnea scale to guide intensity level when exercising independently       Knowledge and understanding of Target Heart Rate Range (THRR) Yes       Intervention Provide education and explanation of THRR including how the numbers were predicted and where they are located for reference       Expected Outcomes Short Term: Able to state/look up THRR;Long Term: Able to use THRR to govern intensity when exercising independently;Short Term: Able to use daily as guideline for intensity in rehab       Able to check pulse independently Yes       Intervention Provide education and demonstration on how to check pulse in carotid and radial arteries.;Review the importance of being able to check your own pulse for safety during independent exercise       Expected Outcomes Short Term: Able to explain why pulse checking is important during independent exercise;Long Term: Able to check pulse independently and accurately       Understanding of Exercise Prescription Yes       Intervention Provide education, explanation, and written materials on patient's individual exercise prescription        Expected Outcomes Short Term: Able to explain program exercise prescription;Long Term: Able to explain home exercise prescription to exercise independently                Copy of goals given to participant.

## 2021-01-15 NOTE — Progress Notes (Signed)
Cardiac Individual Treatment Plan  Patient Details  Name: Aaron [name]. MRN: [id number] Date of Birth: 1946-04-09 Referring Provider:   Flowsheet Row Cardiac Rehab from 01/15/2021 in [facility] Cardiac and Pulmonary Rehab  Referring Provider Serafina Royals MD       Initial Encounter Date:  Flowsheet Row Cardiac Rehab from 01/15/2021 in [facility] Cardiac and Pulmonary Rehab  Date 01/15/21       Visit Diagnosis: NSTEMI (non-ST elevated myocardial infarction) [facility])  Status post coronary artery stent placement  Patient's Home Medications on Admission:  Current Outpatient Medications:    amLODipine (NORVASC) 5 MG tablet, Take by mouth., Disp: , Rfl:    aspirin 81 MG chewable tablet, Chew 1 tablet (81 mg total) by mouth daily., Disp:  , Rfl:    carvedilol (COREG) 3.125 MG tablet, Take 1 tablet (3.125 mg total) by mouth 2 (two) times daily with a meal., Disp: 60 tablet, Rfl: 0   cholecalciferol (VITAMIN D3) 25 MCG (1000 UNIT) tablet, Take 1,000 Units by mouth daily., Disp: , Rfl:    Coenzyme Q10 100 MG capsule, Take by mouth., Disp: , Rfl:    ezetimibe (ZETIA) 10 MG tablet, Take 1 tablet (10 mg total) by mouth daily., Disp: 30 tablet, Rfl: 0   ferrous sulfate 325 (65 FE) MG tablet, Take by mouth., Disp: , Rfl:    glipiZIDE (GLUCOTROL XL) 10 MG 24 hr tablet, , Disp: , Rfl:    glucosamine-chondroitin 500-400 MG tablet, Take 1 tablet by mouth 3 (three) times daily., Disp: , Rfl:    ketoconazole (NIZORAL) 2 % shampoo, Apply 1 application topically 2 (two) times a week., Disp: , Rfl: 3   MAGNESIUM OXIDE PO, Take by mouth., Disp: , Rfl:    MANGANESE PO, Take by mouth., Disp: , Rfl:    Multiple Vitamin (MULTIVITAMIN PO), Take by mouth., Disp: , Rfl:    multivitamin-lutein (OCUVITE-LUTEIN) CAPS capsule, Take 1 capsule by mouth daily., Disp: , Rfl:    nitroGLYCERIN (NITROSTAT) 0.4 MG SL tablet, Take 0.4 mg by mouth every 5 (five) minutes as needed for chest pain. , Disp: , Rfl:    Omega-3 Fatty  Acids (FISH OIL) 1200 MG CAPS, Take 2,400 mg by mouth daily. , Disp: , Rfl:    pantoprazole (PROTONIX) 40 MG tablet, Take 1 tablet (40 mg total) by mouth 2 (two) times daily before a meal., Disp: 60 tablet, Rfl: 0   phytonadione (VITAMIN K) 2 MG/ML SOLN oral solution, Take by mouth., Disp: , Rfl:    ticagrelor (BRILINTA) 90 MG TABS tablet, Take 1 tablet (90 mg total) by mouth 2 (two) times daily., Disp: 60 tablet, Rfl: 0   traMADol (ULTRAM) 50 MG tablet, Take 50 mg by mouth every 6 (six) hours as needed for moderate pain., Disp: , Rfl:    vitamin B-12 (CYANOCOBALAMIN) 1000 MCG tablet, Take 1,000 mcg by mouth daily., Disp: , Rfl:   Past Medical History: Past Medical History:  Diagnosis Date   Cellulitis    Diabetes mellitus without complication (HCC)    MI, old     Tobacco Use: Social History   Tobacco Use  Smoking Status Former  Smokeless Tobacco Former  Tobacco Comments   7/20 quit 35 years ago    Labs: Recent Review Flowsheet Data     Labs for ITP Cardiac and Pulmonary Rehab Latest Ref Rng & Units 09/13/2014 06/12/2019 06/13/2019 12/20/2020 12/21/2020   Cholestrol 0 - 200 mg/dL - - 148 - 136   LDLCALC 0 -  99 mg/dL - - UNABLE TO CALCULATE IF TRIGLYCERIDE OVER 400 mg/dL - UNABLE TO CALCULATE IF TRIGLYCERIDE OVER 400 mg/dL   LDLDIRECT 0 - 99 mg/dL - - 16.1 - 09.6   HDL >04 mg/dL - - 54(U) - 98(J)   Trlycerides <150 mg/dL - - 191(Y) - 782(N)   Hemoglobin A1c 4.8 - 5.6 % 6.1(H) 10.6(H) 9.6(H) 8.1(H) -        Exercise Target Goals: Exercise Program Goal: Individual exercise prescription set using results from initial 6 min walk test and THRR while considering  patient's activity barriers and safety.   Exercise Prescription Goal: Initial exercise prescription builds to 30-45 minutes a day of aerobic activity, 2-3 days per week.  Home exercise guidelines will be given to patient during program as part of exercise prescription that the participant will acknowledge.   Education:  Aerobic Exercise: - Group verbal and visual presentation on the components of exercise prescription. Introduces F.I.T.T principle from ACSM for exercise prescriptions.  Reviews F.I.T.T. principles of aerobic exercise including progression. Written material given at graduation.   Education: Resistance Exercise: - Group verbal and visual presentation on the components of exercise prescription. Introduces F.I.T.T principle from ACSM for exercise prescriptions  Reviews F.I.T.T. principles of resistance exercise including progression. Written material given at graduation.    Education: Exercise & Equipment Safety: - Individual verbal instruction and demonstration of equipment use and safety with use of the equipment. Flowsheet Row Cardiac Rehab from 01/15/2021 in [facility] Cardiac and Pulmonary Rehab  Date 01/15/21  Educator [facility]  Instruction Review Code 1- Verbalizes Understanding       Education: Exercise Physiology & General Exercise Guidelines: - Group verbal and written instruction with models to review the exercise physiology of the cardiovascular system and associated critical values. Provides general exercise guidelines with specific guidelines to those with heart or lung disease.    Education: Flexibility, Balance, Mind/Body Relaxation: - Group verbal and visual presentation with interactive activity on the components of exercise prescription. Introduces F.I.T.T principle from ACSM for exercise prescriptions. Reviews F.I.T.T. principles of flexibility and balance exercise training including progression. Also discusses the mind body connection.  Reviews various relaxation techniques to help reduce and manage stress (i.e. Deep breathing, progressive muscle relaxation, and visualization). Balance handout provided to take home. Written material given at graduation.   Activity Barriers & Risk Stratification:  Activity Barriers & Cardiac Risk Stratification - 01/15/21 1142       Activity Barriers  & Cardiac Risk Stratification   Activity Barriers Deconditioning;Muscular Weakness;History of Falls;Joint Problems;Other (comment);Left Knee Replacement;Shortness of Breath;Back Problems    Comments bilateral shoulder surgeries, hips hurt with walking    Cardiac Risk Stratification High             6 Minute Walk:  6 Minute Walk     Row Name 01/15/21 1141         6 Minute Walk   Phase Initial     Distance 895 feet     Walk Time 6 minutes     # of Rest Breaks 0     MPH 1.69     METS 2.34     RPE 13     Perceived Dyspnea  1     VO2 Peak 8.18     Symptoms Yes (comment)     Comments hip pain 3/10, chest pain 2/10, SOB     Resting HR 82 bpm     Resting BP 124/66     Resting Oxygen Saturation  98 %     Exercise Oxygen Saturation  during 6 min walk 96 %     Max Ex. HR 122 bpm     Max Ex. BP 196/74     2 Minute Post BP 148/70              Oxygen Initial Assessment:   Oxygen Re-Evaluation:   Oxygen Discharge (Final Oxygen Re-Evaluation):   Initial Exercise Prescription:  Initial Exercise Prescription - 01/15/21 1100       Date of Initial Exercise RX and Referring Provider   Date 01/15/21    Referring Provider Arnoldo Hooker MD      Treadmill   MPH 1.5    Grade 0.5    Minutes 15    METs 2.25      Recumbant Elliptical   Level 1    RPM 50    Minutes 15    METs 2      REL-XR   Level 1    Speed 50    Minutes 15    METs 2      Prescription Details   Frequency (times per week) 3    Duration Progress to 30 minutes of continuous aerobic without signs/symptoms of physical distress      Intensity   THRR 40-80% of Max Heartrate 107-132    Ratings of Perceived Exertion 11-13    Perceived Dyspnea 0-4      Progression   Progression Continue to progress workloads to maintain intensity without signs/symptoms of physical distress.      Resistance Training   Training Prescription Yes    Weight 4 lb    Reps 10-15             Perform Capillary  Blood Glucose checks as needed.  Exercise Prescription Changes:   Exercise Prescription Changes     Row Name 01/15/21 1100             Response to Exercise   Blood Pressure (Admit) 124/66       Blood Pressure (Exercise) 196/74  rck 148/70       Blood Pressure (Exit) 132/66       Heart Rate (Admit) 82 bpm       Heart Rate (Exercise) 122 bpm       Heart Rate (Exit) 83 bpm       Oxygen Saturation (Admit) 98 %       Oxygen Saturation (Exercise) 96 %       Rating of Perceived Exertion (Exercise) 13       Perceived Dyspnea (Exercise) 1       Symptoms hip pain 3/10, chest pain 2/10, SOB       Comments walk test results                Exercise Comments:   Exercise Goals and Review:   Exercise Goals     Row Name 01/15/21 1145             Exercise Goals   Increase Physical Activity Yes       Intervention Provide advice, education, support and counseling about physical activity/exercise needs.;Develop an individualized exercise prescription for aerobic and resistive training based on initial evaluation findings, risk stratification, comorbidities and participant's personal goals.       Expected Outcomes Short Term: Attend rehab on a regular basis to increase amount of physical activity.;Long Term: Add in home exercise to make exercise part of routine and to increase amount of physical activity.;Long  Term: Exercising regularly at least 3-5 days a week.       Increase Strength and Stamina Yes       Intervention Provide advice, education, support and counseling about physical activity/exercise needs.;Develop an individualized exercise prescription for aerobic and resistive training based on initial evaluation findings, risk stratification, comorbidities and participant's personal goals.       Expected Outcomes Short Term: Increase workloads from initial exercise prescription for resistance, speed, and METs.;Short Term: Perform resistance training exercises routinely during rehab  and add in resistance training at home;Long Term: Improve cardiorespiratory fitness, muscular endurance and strength as measured by increased METs and functional capacity ( )       Able to understand and use rate of perceived exertion (RPE) scale Yes       Intervention Provide education and explanation on how to use RPE scale       Expected Outcomes Short Term: Able to use RPE daily in rehab to express subjective intensity level;Long Term:  Able to use RPE to guide intensity level when exercising independently       Able to understand and use Dyspnea scale Yes       Intervention Provide education and explanation on how to use Dyspnea scale       Expected Outcomes Short Term: Able to use Dyspnea scale daily in rehab to express subjective sense of shortness of breath during exertion;Long Term: Able to use Dyspnea scale to guide intensity level when exercising independently       Knowledge and understanding of Target Heart Rate Range (THRR) Yes       Intervention Provide education and explanation of THRR including how the numbers were predicted and where they are located for reference       Expected Outcomes Short Term: Able to state/look up THRR;Long Term: Able to use THRR to govern intensity when exercising independently;Short Term: Able to use daily as guideline for intensity in rehab       Able to check pulse independently Yes       Intervention Provide education and demonstration on how to check pulse in carotid and radial arteries.;Review the importance of being able to check your own pulse for safety during independent exercise       Expected Outcomes Short Term: Able to explain why pulse checking is important during independent exercise;Long Term: Able to check pulse independently and accurately       Understanding of Exercise Prescription Yes       Intervention Provide education, explanation, and written materials on patient's individual exercise prescription       Expected Outcomes Short  Term: Able to explain program exercise prescription;Long Term: Able to explain home exercise prescription to exercise independently                Exercise Goals Re-Evaluation :   Discharge Exercise Prescription (Final Exercise Prescription Changes):  Exercise Prescription Changes - 01/15/21 1100       Response to Exercise   Blood Pressure (Admit) 124/66    Blood Pressure (Exercise) 196/74   rck 148/70   Blood Pressure (Exit) 132/66    Heart Rate (Admit) 82 bpm    Heart Rate (Exercise) 122 bpm    Heart Rate (Exit) 83 bpm    Oxygen Saturation (Admit) 98 %    Oxygen Saturation (Exercise) 96 %    Rating of Perceived Exertion (Exercise) 13    Perceived Dyspnea (Exercise) 1    Symptoms hip pain 3/10, chest pain 2/10, SOB  Comments walk test results             Nutrition:  Target Goals: Understanding of nutrition guidelines, daily intake of sodium 1500mg , cholesterol 200mg , calories 30% from fat and 7% or less from saturated fats, daily to have 5 or more servings of fruits and vegetables.  Education: All About Nutrition: -Group instruction provided by verbal, written material, interactive activities, discussions, models, and posters to present general guidelines for heart healthy nutrition including fat, fiber, MyPlate, the role of sodium in heart healthy nutrition, utilization of the nutrition label, and utilization of this knowledge for meal planning. Follow up email sent as well. Written material given at graduation. Flowsheet Row Cardiac Rehab from 01/15/2021 in [facility] Cardiac and Pulmonary Rehab  Education need identified 01/15/21       Biometrics:  Pre Biometrics - 01/15/21 1145       Pre Biometrics   Height 5' 11.6" (1.819 m)    Weight 244 lb 12.8 oz (111 kg)    BMI (Calculated) 33.56    Single Leg Stand 30 seconds              Nutrition Therapy Plan and Nutrition Goals:  Nutrition Therapy & Goals - 01/08/21 1416       Intervention Plan    Intervention Prescribe, educate and counsel regarding individualized specific dietary modifications aiming towards targeted core components such as weight, hypertension, lipid management, diabetes, heart failure and other comorbidities.    Expected Outcomes Short Term Goal: A plan has been developed with personal nutrition goals set during dietitian appointment.;Short Term Goal: Understand basic principles of dietary content, such as calories, fat, sodium, cholesterol and nutrients.;Long Term Goal: Adherence to prescribed nutrition plan.             Nutrition Assessments:  MEDIFICTS Score Key: ?70 Need to make dietary changes  40-70 Heart Healthy Diet ? 40 Therapeutic Level Cholesterol Diet  Flowsheet Row Cardiac Rehab from 01/15/2021 in [facility] Cardiac and Pulmonary Rehab  Picture Your Plate Total Score on Admission 64      Picture Your Plate Scores: <16 Unhealthy dietary pattern with much room for improvement. 41-50 Dietary pattern unlikely to meet recommendations for good health and room for improvement. 51-60 More healthful dietary pattern, with some room for improvement.  >60 Healthy dietary pattern, although there may be some specific behaviors that could be improved.    Nutrition Goals Re-Evaluation:   Nutrition Goals Discharge (Final Nutrition Goals Re-Evaluation):   Psychosocial: Target Goals: Acknowledge presence or absence of significant depression and/or stress, maximize coping skills, provide positive support system. Participant is able to verbalize types and ability to use techniques and skills needed for reducing stress and depression.   Education: Stress, Anxiety, and Depression - Group verbal and visual presentation to define topics covered.  Reviews how body is impacted by stress, anxiety, and depression.  Also discusses healthy ways to reduce stress and to treat/manage anxiety and depression.  Written material given at graduation.   Education: Sleep  Hygiene -Provides group verbal and written instruction about how sleep can affect your health.  Define sleep hygiene, discuss sleep cycles and impact of sleep habits. Review good sleep hygiene tips.    Initial Review & Psychosocial Screening:  Initial Psych Review & Screening - 01/08/21 1416       Initial Review   Current issues with Current Sleep Concerns;Current Stress Concerns    Source of Stress Concerns Chronic Illness;Financial    Comments if he naps during day  he has a hard time getting to sleep, finances and health are stable but not great      Family Dynamics   Good Support System? Yes   daughter calls to check in, son lives in [place] but rest live nearby     Barriers   Psychosocial barriers to participate in program The patient should benefit from training in stress management and relaxation.;Psychosocial barriers identified (see note)      Screening Interventions   Interventions Encouraged to exercise;Provide feedback about the scores to participant;To provide support and resources with identified psychosocial needs    Expected Outcomes Short Term goal: Utilizing psychosocial counselor, staff and physician to assist with identification of specific Stressors or current issues interfering with healing process. Setting desired goal for each stressor or current issue identified.;Long Term Goal: Stressors or current issues are controlled or eliminated.;Long Term goal: The participant improves quality of Life and PHQ9 Scores as seen by post scores and/or verbalization of changes;Short Term goal: Identification and review with participant of any Quality of Life or Depression concerns found by scoring the questionnaire.             Quality of Life Scores:   Quality of Life - 01/15/21 1145       Quality of Life   Select Quality of Life      Quality of Life Scores   Health/Function Pre 19.29 %    Socioeconomic Pre 28.29 %    Psych/Spiritual Pre 22.29 %    Family Pre 25.5 %     GLOBAL Pre 22.69 %            Scores of 19 and below usually indicate a poorer quality of life in these areas.  A difference of  2-3 points is a clinically meaningful difference.  A difference of 2-3 points in the total score of the Quality of Life Index has been associated with significant improvement in overall quality of life, self-image, physical symptoms, and general health in studies assessing change in quality of life.  PHQ-9: Recent Review Flowsheet Data     Depression screen [facility] 2/9 01/15/2021   Decreased Interest 1   Down, Depressed, Hopeless 0   PHQ - 2 Score 1   Altered sleeping 0   Tired, decreased energy 3   Change in appetite 0   Feeling bad or failure about yourself  0   Trouble concentrating 0   Moving slowly or fidgety/restless 0   Suicidal thoughts 0   PHQ-9 Score 4   Difficult doing work/chores Very difficult      Interpretation of Total Score  Total Score Depression Severity:  1-4 = Minimal depression, 5-9 = Mild depression, 10-14 = Moderate depression, 15-19 = Moderately severe depression, 20-27 = Severe depression   Psychosocial Evaluation and Intervention:  Psychosocial Evaluation - 01/08/21 1420       Psychosocial Evaluation & Interventions   Interventions Stress management education;Encouraged to exercise with the program and follow exercise prescription    Comments Mr. 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  He has never done rehab before with his prior events but really wants to get moving again and feel better overall.  He wants to rebuild his strength to be able to do what he would like at home.  He denies any depression or anxiety.  He usually sleeps well at night and gets about 8hrs a night.  If he naps, he has a harder time getting to sleep at  night.  He has a great support system as his son and daughter both live within 500 ft and check in often.  His son is currently in [place] by his wife checks in on him.  He also has a  history of diabetes and kidney disease.  His kidneys are doing well and he is hoping to get a FreeStyle meter from his doctor to make checking his sugars easier.    Expected Outcomes Short: Start rehab to build strength Long: Attend regularly to build up stamina.    Continue Psychosocial Services  Follow up required by staff             Psychosocial Re-Evaluation:   Psychosocial Discharge (Final Psychosocial Re-Evaluation):   Vocational Rehabilitation: Provide vocational rehab assistance to qualifying candidates.   Vocational Rehab Evaluation & Intervention:   Education: Education Goals: Education classes will be provided on a variety of topics geared toward better understanding of heart health and risk factor modification. Participant will state understanding/return demonstration of topics presented as noted by education test scores.  Learning Barriers/Preferences:  Learning Barriers/Preferences - 01/08/21 1414       Learning Barriers/Preferences   Learning Barriers Sight   glasses   Learning Preferences None             General Cardiac Education Topics:  AED/CPR: - Group verbal and written instruction with the use of models to demonstrate the basic use of the AED with the basic ABC's of resuscitation.   Anatomy and Cardiac Procedures: - Group verbal and visual presentation and models provide information about basic cardiac anatomy and function. Reviews the testing methods done to diagnose heart disease and the outcomes of the test results. Describes the treatment choices: Medical Management, Angioplasty, or Coronary Bypass Surgery for treating various heart conditions including Myocardial Infarction, Angina, Valve Disease, and Cardiac Arrhythmias.  Written material given at graduation. Flowsheet Row Cardiac Rehab from 01/15/2021 in [facility] Cardiac and Pulmonary Rehab  Education need identified 01/15/21       Medication Safety: - Group verbal and visual instruction  to review commonly prescribed medications for heart and lung disease. Reviews the medication, class of the drug, and side effects. Includes the steps to properly store meds and maintain the prescription regimen.  Written material given at graduation.   Intimacy: - Group verbal instruction through game format to discuss how heart and lung disease can affect sexual intimacy. Written material given at graduation..   Know Your Numbers and Heart Failure: - Group verbal and visual instruction to discuss disease risk factors for cardiac and pulmonary disease and treatment options.  Reviews associated critical values for Overweight/Obesity, Hypertension, Cholesterol, and Diabetes.  Discusses basics of heart failure: signs/symptoms and treatments.  Introduces Heart Failure Zone chart for action plan for heart failure.  Written material given at graduation.   Infection Prevention: - Provides verbal and written material to individual with discussion of infection control including proper hand washing and proper equipment cleaning during exercise session. Flowsheet Row Cardiac Rehab from 01/15/2021 in [facility] Cardiac and Pulmonary Rehab  Date 01/15/21  Educator [facility]  Instruction Review Code 1- Verbalizes Understanding       Falls Prevention: - Provides verbal and written material to individual with discussion of falls prevention and safety. Flowsheet Row Cardiac Rehab from 01/15/2021 in [facility] Cardiac and Pulmonary Rehab  Date 01/15/21  Educator [facility]  Instruction Review Code 1- Verbalizes Understanding       Other: -Provides group and verbal instruction on various topics (  see comments)   Knowledge Questionnaire Score:  Knowledge Questionnaire Score - 01/15/21 1146       Knowledge Questionnaire Score   Pre Score 24/26 angina, exercise             Core Components/Risk Factors/Patient Goals at Admission:  Personal Goals and Risk Factors at Admission - 01/15/21 1147       Core Components/Risk  Factors/Patient Goals on Admission    Weight Management Yes;Weight Loss;Obesity    Intervention Weight Management: Develop a combined nutrition and exercise program designed to reach desired caloric intake, while maintaining appropriate intake of nutrient and fiber, sodium and fats, and appropriate energy expenditure required for the weight goal.;Weight Management: Provide education and appropriate resources to help participant work on and attain dietary goals.;Weight Management/Obesity: Establish reasonable short term and long term weight goals.;Obesity: Provide education and appropriate resources to help participant work on and attain dietary goals.    Admit Weight 244 lb 12.8 oz (111 kg)    Goal Weight: Short Term 240 lb (108.9 kg)    Goal Weight: Long Term 230 lb (104.3 kg)    Expected Outcomes Short Term: Continue to assess and modify interventions until short term weight is achieved;Long Term: Adherence to nutrition and physical activity/exercise program aimed toward attainment of established weight goal;Weight Loss: Understanding of general recommendations for a balanced deficit meal plan, which promotes 1-2 lb weight loss per week and includes a negative energy balance of [phone] kcal/d;Understanding of distribution of calorie intake throughout the day with the consumption of 4-5 meals/snacks;Understanding recommendations for meals to include 15-35% energy as protein, 25-35% energy from fat, 35-60% energy from carbohydrates, less than 200mg  of dietary cholesterol, 20-35 gm of total fiber daily    Diabetes Yes    Intervention Provide education about signs/symptoms and action to take for hypo/hyperglycemia.;Provide education about proper nutrition, including hydration, and aerobic/resistive exercise prescription along with prescribed medications to achieve blood glucose in normal ranges: Fasting glucose 65-99 mg/dL    Expected Outcomes Short Term: Participant verbalizes understanding of the  signs/symptoms and immediate care of hyper/hypoglycemia, proper foot care and importance of medication, aerobic/resistive exercise and nutrition plan for blood glucose control.;Long Term: Attainment of HbA1C < 7%.    Heart Failure Yes    Intervention Provide a combined exercise and nutrition program that is supplemented with education, support and counseling about heart failure. Directed toward relieving symptoms such as shortness of breath, decreased exercise tolerance, and extremity edema.    Expected Outcomes Improve functional capacity of life;Short term: Attendance in program 2-3 days a week with increased exercise capacity. Reported lower sodium intake. Reported increased fruit and vegetable intake. Reports medication compliance.;Short term: Daily weights obtained and reported for increase. Utilizing diuretic protocols set by physician.;Long term: Adoption of self-care skills and reduction of barriers for early signs and symptoms recognition and intervention leading to self-care maintenance.    Hypertension Yes    Intervention Provide education on lifestyle modifcations including regular physical activity/exercise, weight management, moderate sodium restriction and increased consumption of fresh fruit, vegetables, and low fat dairy, alcohol moderation, and smoking cessation.;Monitor prescription use compliance.    Expected Outcomes Short Term: Continued assessment and intervention until BP is < 140/89mm HG in hypertensive participants. < 130/71mm HG in hypertensive participants with diabetes, heart failure or chronic kidney disease.;Long Term: Maintenance of blood pressure at goal levels.    Lipids Yes    Intervention Provide education and support for participant on nutrition & aerobic/resistive exercise along with prescribed  medications to achieve LDL 70mg , HDL >40mg .    Expected Outcomes Long Term: Cholesterol controlled with medications as prescribed, with individualized exercise RX and with  personalized nutrition plan. Value goals: LDL < 70mg , HDL > 40 mg.;Short Term: Participant states understanding of desired cholesterol values and is compliant with medications prescribed. Participant is following exercise prescription and nutrition guidelines.             Education:Diabetes - Individual verbal and written instruction to review signs/symptoms of diabetes, desired ranges of glucose level fasting, after meals and with exercise. Acknowledge that pre and post exercise glucose checks will be done for 3 sessions at entry of program. Flowsheet Row [facility]  Date 01/15/21  [facility]  Instruction Review Code 1- Verbalizes Understanding       Core Components/Risk Factors/Patient Goals Review:    Core Components/Risk Factors/Patient Goals at Discharge (Final Review):    ITP Comments:  ITP Comments     Row Name 01/08/21 1425 01/15/21 1140         ITP Comments Completed virtual orientation today.  EP evaluation is scheduled for Wednesday 01/15/21 at 930am.  [facility] Completed 6MWT and gym orientation. Initial ITP created and sent for review to Dr. Bethann PunchesMark Miller, Medical Director.               Comments: Initial ITP

## 2021-01-22 ENCOUNTER — Other Ambulatory Visit: Payer: Self-pay

## 2021-01-22 ENCOUNTER — Encounter: Payer: Medicare HMO | Attending: Internal Medicine

## 2021-01-22 DIAGNOSIS — I214 Non-ST elevation (NSTEMI) myocardial infarction: Secondary | ICD-10-CM | POA: Insufficient documentation

## 2021-01-22 DIAGNOSIS — Z955 Presence of coronary angioplasty implant and graft: Secondary | ICD-10-CM | POA: Insufficient documentation

## 2021-01-22 LAB — GLUCOSE, CAPILLARY
Glucose-Capillary: 255 mg/dL — ABNORMAL HIGH (ref 70–99)
Glucose-Capillary: 252 mg/dL — ABNORMAL HIGH (ref 70–99)

## 2021-01-22 NOTE — Progress Notes (Signed)
Daily Session Note  Patient Details  Name: Aaron [name]. MRN: [id number] Date of Birth: 07/26/1945 Referring Provider:   Flowsheet Row Cardiac Rehab from 01/15/2021 in [facility] Cardiac and Pulmonary Rehab  Referring Provider Serafina Royals MD       Encounter Date: 01/22/2021  Check In:  Session Check In - 01/22/21 1006       Check-In   Supervising physician immediately available to respond to emergencies See telemetry face sheet for immediately available ER MD    Location [facility]-Cardiac & Pulmonary Rehab    Staff Present Hope Budds, RDN, Rowe Pavy, BA, ACSM CEP, Exercise Physiologist;Calina Patrie, RN,BC,MSN    Virtual Visit No    Medication changes reported     No    Fall or balance concerns reported    No    Resistance Training Performed Yes    VAD Patient? No    PAD/SET Patient? No      Pain Assessment   Currently in Pain? No/denies    Multiple Pain Sites No                Social History   Tobacco Use  Smoking Status Former  Smokeless Tobacco Former  Tobacco Comments   7/20 quit 35 years ago    Goals Met:  Independence with exercise equipment Exercise tolerated well No report of cardiac concerns or symptoms  Goals Unmet:  Not Applicable  Comments: Pt able to follow exercise prescription today without complaint.  Will continue to monitor for progression.    Dr. Emily Filbert is Medical Director for [place].  Dr. Ottie Glazier is Medical Director for [facility] Pulmonary Rehabilitation.

## 2021-01-24 ENCOUNTER — Other Ambulatory Visit: Payer: Self-pay

## 2021-01-24 ENCOUNTER — Encounter: Payer: Medicare HMO | Admitting: *Deleted

## 2021-01-24 DIAGNOSIS — I214 Non-ST elevation (NSTEMI) myocardial infarction: Secondary | ICD-10-CM | POA: Diagnosis not present

## 2021-01-24 DIAGNOSIS — Z955 Presence of coronary angioplasty implant and graft: Secondary | ICD-10-CM

## 2021-01-24 LAB — GLUCOSE, CAPILLARY
Glucose-Capillary: 270 mg/dL — ABNORMAL HIGH (ref 70–99)
Glucose-Capillary: 278 mg/dL — ABNORMAL HIGH (ref 70–99)

## 2021-01-24 NOTE — Progress Notes (Signed)
Daily Session Note  Patient Details  Name: Aaron [name]. MRN: [id number] Date of Birth: [birth year] Referring Provider:   Flowsheet Row Cardiac Rehab from 01/15/2021 in [facility] Cardiac and Pulmonary Rehab  Referring Provider Serafina Royals MD       Encounter Date: 01/24/2021  Check In:  Session Check In - 01/24/21 0942       Check-In   Supervising physician immediately available to respond to emergencies See telemetry face sheet for immediately available ER MD    Location [facility]-Cardiac & Pulmonary Rehab    Staff Present Renita Papa, RN BSN;Joseph [place], RCP,RRT,BSRT;Jessica Macclenny, [place], RCEP, CCRP, CCET    Virtual Visit No    Medication changes reported     No    Fall or balance concerns reported    No    Warm-up and Cool-down Performed on first and last piece of equipment    Resistance Training Performed Yes    VAD Patient? No    PAD/SET Patient? No      Pain Assessment   Currently in Pain? No/denies                Social History   Tobacco Use  Smoking Status Former  Smokeless Tobacco Former  Tobacco Comments   7/20 quit 35 years ago    Goals Met:  Independence with exercise equipment Exercise tolerated well No report of cardiac concerns or symptoms Strength training completed today  Goals Unmet:  Not Applicable  Comments: Pt able to follow exercise prescription today without complaint.  Will continue to monitor for progression.    Dr. Emily Filbert is Medical Director for [place].  Dr. Ottie Glazier is Medical Director for [facility] Pulmonary Rehabilitation.

## 2021-01-27 ENCOUNTER — Encounter: Payer: Medicare HMO | Admitting: *Deleted

## 2021-01-27 ENCOUNTER — Other Ambulatory Visit: Payer: Self-pay

## 2021-01-27 DIAGNOSIS — Z955 Presence of coronary angioplasty implant and graft: Secondary | ICD-10-CM

## 2021-01-27 DIAGNOSIS — I214 Non-ST elevation (NSTEMI) myocardial infarction: Secondary | ICD-10-CM | POA: Diagnosis not present

## 2021-01-27 LAB — GLUCOSE, CAPILLARY: Glucose-Capillary: 345 mg/dL — ABNORMAL HIGH (ref 70–99)

## 2021-01-27 NOTE — Progress Notes (Signed)
Incomplete Session Note  Patient Details  Name: Aaron [name]. MRN: [id number] Date of Birth: [birth year] Referring Provider:   Flowsheet Row Cardiac Rehab from 01/15/2021 in [facility] Cardiac and Pulmonary Rehab  Referring Provider Arnoldo Hooker MD       Aaron [name]. did not complete his rehab session.  Blood sugar is 345mg /DL He has asked his provider for a  meter like the Freestyle.  He was advised to call his provider and let them know he does not have a working meter and his blood sugar was elevated this AM.  He normally has been arrive with blood sugar in 200 range.

## 2021-01-29 ENCOUNTER — Other Ambulatory Visit: Payer: Self-pay

## 2021-01-29 ENCOUNTER — Encounter: Payer: Self-pay | Admitting: *Deleted

## 2021-01-29 DIAGNOSIS — Z955 Presence of coronary angioplasty implant and graft: Secondary | ICD-10-CM | POA: Diagnosis not present

## 2021-01-29 DIAGNOSIS — I214 Non-ST elevation (NSTEMI) myocardial infarction: Secondary | ICD-10-CM

## 2021-01-29 LAB — GLUCOSE, CAPILLARY
Glucose-Capillary: 214 mg/dL — ABNORMAL HIGH (ref 70–99)
Glucose-Capillary: 198 mg/dL — ABNORMAL HIGH (ref 70–99)

## 2021-01-29 NOTE — Progress Notes (Signed)
Cardiac Individual Treatment Plan  Patient Details  Name: Aaron [name]. MRN: [id number] Date of Birth: 1946-04-09 Referring Provider:   Flowsheet Row Cardiac Rehab from 01/15/2021 in [facility] Cardiac and Pulmonary Rehab  Referring Provider Serafina Royals MD       Initial Encounter Date:  Flowsheet Row Cardiac Rehab from 01/15/2021 in [facility] Cardiac and Pulmonary Rehab  Date 01/15/21       Visit Diagnosis: NSTEMI (non-ST elevated myocardial infarction) [facility])  Status post coronary artery stent placement  Patient's Home Medications on Admission:  Current Outpatient Medications:    amLODipine (NORVASC) 5 MG tablet, Take by mouth., Disp: , Rfl:    aspirin 81 MG chewable tablet, Chew 1 tablet (81 mg total) by mouth daily., Disp:  , Rfl:    carvedilol (COREG) 3.125 MG tablet, Take 1 tablet (3.125 mg total) by mouth 2 (two) times daily with a meal., Disp: 60 tablet, Rfl: 0   cholecalciferol (VITAMIN D3) 25 MCG (1000 UNIT) tablet, Take 1,000 Units by mouth daily., Disp: , Rfl:    Coenzyme Q10 100 MG capsule, Take by mouth., Disp: , Rfl:    ezetimibe (ZETIA) 10 MG tablet, Take 1 tablet (10 mg total) by mouth daily., Disp: 30 tablet, Rfl: 0   ferrous sulfate 325 (65 FE) MG tablet, Take by mouth., Disp: , Rfl:    glipiZIDE (GLUCOTROL XL) 10 MG 24 hr tablet, , Disp: , Rfl:    glucosamine-chondroitin 500-400 MG tablet, Take 1 tablet by mouth 3 (three) times daily., Disp: , Rfl:    ketoconazole (NIZORAL) 2 % shampoo, Apply 1 application topically 2 (two) times a week., Disp: , Rfl: 3   MAGNESIUM OXIDE PO, Take by mouth., Disp: , Rfl:    MANGANESE PO, Take by mouth., Disp: , Rfl:    Multiple Vitamin (MULTIVITAMIN PO), Take by mouth., Disp: , Rfl:    multivitamin-lutein (OCUVITE-LUTEIN) CAPS capsule, Take 1 capsule by mouth daily., Disp: , Rfl:    nitroGLYCERIN (NITROSTAT) 0.4 MG SL tablet, Take 0.4 mg by mouth every 5 (five) minutes as needed for chest pain. , Disp: , Rfl:    Omega-3 Fatty  Acids (FISH OIL) 1200 MG CAPS, Take 2,400 mg by mouth daily. , Disp: , Rfl:    pantoprazole (PROTONIX) 40 MG tablet, Take 1 tablet (40 mg total) by mouth 2 (two) times daily before a meal., Disp: 60 tablet, Rfl: 0   phytonadione (VITAMIN K) 2 MG/ML SOLN oral solution, Take by mouth., Disp: , Rfl:    ticagrelor (BRILINTA) 90 MG TABS tablet, Take 1 tablet (90 mg total) by mouth 2 (two) times daily., Disp: 60 tablet, Rfl: 0   traMADol (ULTRAM) 50 MG tablet, Take 50 mg by mouth every 6 (six) hours as needed for moderate pain., Disp: , Rfl:    vitamin B-12 (CYANOCOBALAMIN) 1000 MCG tablet, Take 1,000 mcg by mouth daily., Disp: , Rfl:   Past Medical History: Past Medical History:  Diagnosis Date   Cellulitis    Diabetes mellitus without complication (HCC)    MI, old     Tobacco Use: Social History   Tobacco Use  Smoking Status Former  Smokeless Tobacco Former  Tobacco Comments   7/20 quit 35 years ago    Labs: Recent Review Flowsheet Data     Labs for ITP Cardiac and Pulmonary Rehab Latest Ref Rng & Units 09/13/2014 06/12/2019 06/13/2019 12/20/2020 12/21/2020   Cholestrol 0 - 200 mg/dL - - 148 - 136   LDLCALC 0 -  99 mg/dL - - UNABLE TO CALCULATE IF TRIGLYCERIDE OVER 400 mg/dL - UNABLE TO CALCULATE IF TRIGLYCERIDE OVER 400 mg/dL   LDLDIRECT 0 - 99 mg/dL - - 84.6 - 96.2   HDL >95 mg/dL - - 28(U) - 13(K)   Trlycerides <150 mg/dL - - 440(N) - 027(O)   Hemoglobin A1c 4.8 - 5.6 % 6.1(H) 10.6(H) 9.6(H) 8.1(H) -        Exercise Target Goals: Exercise Program Goal: Individual exercise prescription set using results from initial 6 min walk test and THRR while considering  patient's activity barriers and safety.   Exercise Prescription Goal: Initial exercise prescription builds to 30-45 minutes a day of aerobic activity, 2-3 days per week.  Home exercise guidelines will be given to patient during program as part of exercise prescription that the participant will acknowledge.   Education:  Aerobic Exercise: - Group verbal and visual presentation on the components of exercise prescription. Introduces F.I.T.T principle from ACSM for exercise prescriptions.  Reviews F.I.T.T. principles of aerobic exercise including progression. Written material given at graduation.   Education: Resistance Exercise: - Group verbal and visual presentation on the components of exercise prescription. Introduces F.I.T.T principle from ACSM for exercise prescriptions  Reviews F.I.T.T. principles of resistance exercise including progression. Written material given at graduation.    Education: Exercise & Equipment Safety: - Individual verbal instruction and demonstration of equipment use and safety with use of the equipment. Flowsheet Row Cardiac Rehab from 01/22/2021 in [facility] Cardiac and Pulmonary Rehab  Date 01/15/21  Educator [facility]  Instruction Review Code 1- Verbalizes Understanding       Education: Exercise Physiology & General Exercise Guidelines: - Group verbal and written instruction with models to review the exercise physiology of the cardiovascular system and associated critical values. Provides general exercise guidelines with specific guidelines to those with heart or lung disease.    Education: Flexibility, Balance, Mind/Body Relaxation: - Group verbal and visual presentation with interactive activity on the components of exercise prescription. Introduces F.I.T.T principle from ACSM for exercise prescriptions. Reviews F.I.T.T. principles of flexibility and balance exercise training including progression. Also discusses the mind body connection.  Reviews various relaxation techniques to help reduce and manage stress (i.e. Deep breathing, progressive muscle relaxation, and visualization). Balance handout provided to take home. Written material given at graduation.   Activity Barriers & Risk Stratification:  Activity Barriers & Cardiac Risk Stratification - 01/15/21 1142       Activity Barriers  & Cardiac Risk Stratification   Activity Barriers Deconditioning;Muscular Weakness;History of Falls;Joint Problems;Other (comment);Left Knee Replacement;Shortness of Breath;Back Problems    Comments bilateral shoulder surgeries, hips hurt with walking    Cardiac Risk Stratification High             6 Minute Walk:  6 Minute Walk     Row Name 01/15/21 1141         6 Minute Walk   Phase Initial     Distance 895 feet     Walk Time 6 minutes     # of Rest Breaks 0     MPH 1.69     METS 2.34     RPE 13     Perceived Dyspnea  1     VO2 Peak 8.18     Symptoms Yes (comment)     Comments hip pain 3/10, chest pain 2/10, SOB     Resting HR 82 bpm     Resting BP 124/66     Resting Oxygen Saturation  98 %     Exercise Oxygen Saturation  during 6 min walk 96 %     Max Ex. HR 122 bpm     Max Ex. BP 196/74     2 Minute Post BP 148/70              Oxygen Initial Assessment:   Oxygen Re-Evaluation:   Oxygen Discharge (Final Oxygen Re-Evaluation):   Initial Exercise Prescription:  Initial Exercise Prescription - 01/15/21 1100       Date of Initial Exercise RX and Referring Provider   Date 01/15/21    Referring Provider Arnoldo Hooker MD      Treadmill   MPH 1.5    Grade 0.5    Minutes 15    METs 2.25      Recumbant Elliptical   Level 1    RPM 50    Minutes 15    METs 2      REL-XR   Level 1    Speed 50    Minutes 15    METs 2      Prescription Details   Frequency (times per week) 3    Duration Progress to 30 minutes of continuous aerobic without signs/symptoms of physical distress      Intensity   THRR 40-80% of Max Heartrate 107-132    Ratings of Perceived Exertion 11-13    Perceived Dyspnea 0-4      Progression   Progression Continue to progress workloads to maintain intensity without signs/symptoms of physical distress.      Resistance Training   Training Prescription Yes    Weight 4 lb    Reps 10-15             Perform Capillary  Blood Glucose checks as needed.  Exercise Prescription Changes:   Exercise Prescription Changes     Row Name 01/15/21 1100 01/22/21 1300           Response to Exercise   Blood Pressure (Admit) 124/66 130/62      Blood Pressure (Exercise) 196/74  rck 148/70 150/70      Blood Pressure (Exit) 132/66 126/62      Heart Rate (Admit) 82 bpm 62 bpm      Heart Rate (Exercise) 122 bpm 110 bpm      Heart Rate (Exit) 83 bpm 71 bpm      Oxygen Saturation (Admit) 98 % --      Oxygen Saturation (Exercise) 96 % --      Rating of Perceived Exertion (Exercise) 13 11      Perceived Dyspnea (Exercise) 1 --      Symptoms hip pain 3/10, chest pain 2/10, SOB hip pain walking      Comments walk test results --      Duration -- Progress to 30 minutes of  aerobic without signs/symptoms of physical distress      Intensity -- THRR unchanged             Progression      Progression -- Continue to progress workloads to maintain intensity without signs/symptoms of physical distress.      Average METs -- 2.7             Resistance Training      Training Prescription -- Yes      Weight -- 4 lb      Reps -- 10-15             Treadmill  MPH -- 0.7      Minutes -- 15             REL-XR      Level -- 1      Speed -- 50      Minutes -- 15      METs -- 2.7              Exercise Comments:   Exercise Comments     Row Name 01/27/21 1000           Exercise Comments Jerline Pain. did not complete his rehab session.  Blood sugar is 345mg /DL He has asked his provider for a  meter like the Freestyle.   He was advised to call his provider and let them know he does not have a working meter and his blood sugar was elevated this AM.  He normally has been arrive with blood sugar in 200 range.                Exercise Goals and Review:   Exercise Goals     Row Name 01/15/21 1145             Exercise Goals   Increase Physical Activity Yes       Intervention Provide advice,  education, support and counseling about physical activity/exercise needs.;Develop an individualized exercise prescription for aerobic and resistive training based on initial evaluation findings, risk stratification, comorbidities and participant's personal goals.       Expected Outcomes Short Term: Attend rehab on a regular basis to increase amount of physical activity.;Long Term: Add in home exercise to make exercise part of routine and to increase amount of physical activity.;Long Term: Exercising regularly at least 3-5 days a week.       Increase Strength and Stamina Yes       Intervention Provide advice, education, support and counseling about physical activity/exercise needs.;Develop an individualized exercise prescription for aerobic and resistive training based on initial evaluation findings, risk stratification, comorbidities and participant's personal goals.       Expected Outcomes Short Term: Increase workloads from initial exercise prescription for resistance, speed, and METs.;Short Term: Perform resistance training exercises routinely during rehab and add in resistance training at home;Long Term: Improve cardiorespiratory fitness, muscular endurance and strength as measured by increased METs and functional capacity (01/17/21)       Able to understand and use rate of perceived exertion (RPE) scale Yes       Intervention Provide education and explanation on how to use RPE scale       Expected Outcomes Short Term: Able to use RPE daily in rehab to express subjective intensity level;Long Term:  Able to use RPE to guide intensity level when exercising independently       Able to understand and use Dyspnea scale Yes       Intervention Provide education and explanation on how to use Dyspnea scale       Expected Outcomes Short Term: Able to use Dyspnea scale daily in rehab to express subjective sense of shortness of breath during exertion;Long Term: Able to use Dyspnea scale to guide intensity level when  exercising independently       Knowledge and understanding of Target Heart Rate Range (THRR) Yes       Intervention Provide education and explanation of THRR including how the numbers were predicted and where they are located for reference       Expected Outcomes Short Term: Able to  state/look up THRR;Long Term: Able to use THRR to govern intensity when exercising independently;Short Term: Able to use daily as guideline for intensity in rehab       Able to check pulse independently Yes       Intervention Provide education and demonstration on how to check pulse in carotid and radial arteries.;Review the importance of being able to check your own pulse for safety during independent exercise       Expected Outcomes Short Term: Able to explain why pulse checking is important during independent exercise;Long Term: Able to check pulse independently and accurately       Understanding of Exercise Prescription Yes       Intervention Provide education, explanation, and written materials on patient's individual exercise prescription       Expected Outcomes Short Term: Able to explain program exercise prescription;Long Term: Able to explain home exercise prescription to exercise independently                Exercise Goals Re-Evaluation :  Exercise Goals Re-Evaluation     Row Name 01/22/21 1308             Exercise Goal Re-Evaluation   Exercise Goals Review Increase Physical Activity;Increase Strength and Stamina;Able to understand and use rate of perceived exertion (RPE) scale       Comments First full day of exercise!  Patient was oriented to gym and equipment including functions, settings, policies, and procedures.  Patient's individual exercise prescription and treatment plan were reviewed.  All starting workloads were established based on the results of the 6 minute walk test done at initial orientation visit.  The plan for exercise progression was also introduced and progression will be  customized based on patient's performance and goals.       Expected Outcomes Short: Use RPE daily to regulate intensity. Long: Follow program prescription in THR.                Discharge Exercise Prescription (Final Exercise Prescription Changes):  Exercise Prescription Changes - 01/22/21 1300       Response to Exercise   Blood Pressure (Admit) 130/62    Blood Pressure (Exercise) 150/70    Blood Pressure (Exit) 126/62    Heart Rate (Admit) 62 bpm    Heart Rate (Exercise) 110 bpm    Heart Rate (Exit) 71 bpm    Rating of Perceived Exertion (Exercise) 11    Symptoms hip pain walking    Duration Progress to 30 minutes of  aerobic without signs/symptoms of physical distress    Intensity THRR unchanged      Progression   Progression Continue to progress workloads to maintain intensity without signs/symptoms of physical distress.    Average METs 2.7      Resistance Training   Training Prescription Yes    Weight 4 lb    Reps 10-15      Treadmill   MPH 0.7    Minutes 15      REL-XR   Level 1    Speed 50    Minutes 15    METs 2.7             Nutrition:  Target Goals: Understanding of nutrition guidelines, daily intake of sodium 1500mg , cholesterol 200mg , calories 30% from fat and 7% or less from saturated fats, daily to have 5 or more servings of fruits and vegetables.  Education: All About Nutrition: -Group instruction provided by verbal, written material, interactive activities, discussions, models, and posters  to present general guidelines for heart healthy nutrition including fat, fiber, MyPlate, the role of sodium in heart healthy nutrition, utilization of the nutrition label, and utilization of this knowledge for meal planning. Follow up email sent as well. Written material given at graduation. Flowsheet Row Cardiac Rehab from 01/22/2021 in [facility] Cardiac and Pulmonary Rehab  Education need identified 01/15/21       Biometrics:  Pre Biometrics - 01/15/21 1145        Pre Biometrics   Height 5' 11.6" (1.819 m)    Weight 244 lb 12.8 oz (111 kg)    BMI (Calculated) 33.56    Single Leg Stand 30 seconds              Nutrition Therapy Plan and Nutrition Goals:  Nutrition Therapy & Goals - 01/08/21 1416       Intervention Plan   Intervention Prescribe, educate and counsel regarding individualized specific dietary modifications aiming towards targeted core components such as weight, hypertension, lipid management, diabetes, heart failure and other comorbidities.    Expected Outcomes Short Term Goal: A plan has been developed with personal nutrition goals set during dietitian appointment.;Short Term Goal: Understand basic principles of dietary content, such as calories, fat, sodium, cholesterol and nutrients.;Long Term Goal: Adherence to prescribed nutrition plan.             Nutrition Assessments:  MEDIFICTS Score Key: ?70 Need to make dietary changes  40-70 Heart Healthy Diet ? 40 Therapeutic Level Cholesterol Diet  Flowsheet Row Cardiac Rehab from 01/15/2021 in [facility] Cardiac and Pulmonary Rehab  Picture Your Plate Total Score on Admission 64      Picture Your Plate Scores: <16 Unhealthy dietary pattern with much room for improvement. 41-50 Dietary pattern unlikely to meet recommendations for good health and room for improvement. 51-60 More healthful dietary pattern, with some room for improvement.  >60 Healthy dietary pattern, although there may be some specific behaviors that could be improved.    Nutrition Goals Re-Evaluation:   Nutrition Goals Discharge (Final Nutrition Goals Re-Evaluation):   Psychosocial: Target Goals: Acknowledge presence or absence of significant depression and/or stress, maximize coping skills, provide positive support system. Participant is able to verbalize types and ability to use techniques and skills needed for reducing stress and depression.   Education: Stress, Anxiety, and Depression - Group  verbal and visual presentation to define topics covered.  Reviews how body is impacted by stress, anxiety, and depression.  Also discusses healthy ways to reduce stress and to treat/manage anxiety and depression.  Written material given at graduation.   Education: Sleep Hygiene -Provides group verbal and written instruction about how sleep can affect your health.  Define sleep hygiene, discuss sleep cycles and impact of sleep habits. Review good sleep hygiene tips.    Initial Review & Psychosocial Screening:  Initial Psych Review & Screening - 01/08/21 1416       Initial Review   Current issues with Current Sleep Concerns;Current Stress Concerns    Source of Stress Concerns Chronic Illness;Financial    Comments if he naps during day he has a hard time getting to sleep, finances and health are stable but not great      Family Dynamics   Good Support System? Yes   daughter calls to check in, son lives in [place] but rest live nearby     Barriers   Psychosocial barriers to participate in program The patient should benefit from training in stress management and relaxation.;Psychosocial barriers identified (see  note)      Screening Interventions   Interventions Encouraged to exercise;Provide feedback about the scores to participant;To provide support and resources with identified psychosocial needs    Expected Outcomes Short Term goal: Utilizing psychosocial counselor, staff and physician to assist with identification of specific Stressors or current issues interfering with healing process. Setting desired goal for each stressor or current issue identified.;Long Term Goal: Stressors or current issues are controlled or eliminated.;Long Term goal: The participant improves quality of Life and PHQ9 Scores as seen by post scores and/or verbalization of changes;Short Term goal: Identification and review with participant of any Quality of Life or Depression concerns found by scoring the questionnaire.              Quality of Life Scores:   Quality of Life - 01/15/21 1145       Quality of Life   Select Quality of Life      Quality of Life Scores   Health/Function Pre 19.29 %    Socioeconomic Pre 28.29 %    Psych/Spiritual Pre 22.29 %    Family Pre 25.5 %    GLOBAL Pre 22.69 %            Scores of 19 and below usually indicate a poorer quality of life in these areas.  A difference of  2-3 points is a clinically meaningful difference.  A difference of 2-3 points in the total score of the Quality of Life Index has been associated with significant improvement in overall quality of life, self-image, physical symptoms, and general health in studies assessing change in quality of life.  PHQ-9: Recent Review Flowsheet Data     Depression screen [facility] 2/9 01/15/2021   Decreased Interest 1   Down, Depressed, Hopeless 0   PHQ - 2 Score 1   Altered sleeping 0   Tired, decreased energy 3   Change in appetite 0   Feeling bad or failure about yourself  0   Trouble concentrating 0   Moving slowly or fidgety/restless 0   Suicidal thoughts 0   PHQ-9 Score 4   Difficult doing work/chores Very difficult      Interpretation of Total Score  Total Score Depression Severity:  1-4 = Minimal depression, 5-9 = Mild depression, 10-14 = Moderate depression, 15-19 = Moderately severe depression, 20-27 = Severe depression   Psychosocial Evaluation and Intervention:  Psychosocial Evaluation - 01/08/21 1420       Psychosocial Evaluation & Interventions   Interventions Stress management education;Encouraged to exercise with the program and follow exercise prescription    Comments Mr. 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  He has never done rehab before with his prior events but really wants to get moving again and feel better overall.  He wants to rebuild his strength to be able to do what he would like at home.  He denies any depression or anxiety.  He usually sleeps well  at night and gets about 8hrs a night.  If he naps, he has a harder time getting to sleep at night.  He has a great support system as his son and daughter both live within 500 ft and check in often.  His son is currently in [place] by his wife checks in on him.  He also has a history of diabetes and kidney disease.  His kidneys are doing well and he is hoping to get a FreeStyle meter from his doctor to make checking his sugars  easier.    Expected Outcomes Short: Start rehab to build strength Long: Attend regularly to build up stamina.    Continue Psychosocial Services  Follow up required by staff             Psychosocial Re-Evaluation:   Psychosocial Discharge (Final Psychosocial Re-Evaluation):   Vocational Rehabilitation: Provide vocational rehab assistance to qualifying candidates.   Vocational Rehab Evaluation & Intervention:   Education: Education Goals: Education classes will be provided on a variety of topics geared toward better understanding of heart health and risk factor modification. Participant will state understanding/return demonstration of topics presented as noted by education test scores.  Learning Barriers/Preferences:  Learning Barriers/Preferences - 01/08/21 1414       Learning Barriers/Preferences   Learning Barriers Sight   glasses   Learning Preferences None             General Cardiac Education Topics:  AED/CPR: - Group verbal and written instruction with the use of models to demonstrate the basic use of the AED with the basic ABC's of resuscitation.   Anatomy and Cardiac Procedures: - Group verbal and visual presentation and models provide information about basic cardiac anatomy and function. Reviews the testing methods done to diagnose heart disease and the outcomes of the test results. Describes the treatment choices: Medical Management, Angioplasty, or Coronary Bypass Surgery for treating various heart conditions including Myocardial  Infarction, Angina, Valve Disease, and Cardiac Arrhythmias.  Written material given at graduation. Flowsheet Row [facility]  Education need identified 01/15/21       Medication Safety: - Group verbal and visual instruction to review commonly prescribed medications for heart and lung disease. Reviews the medication, class of the drug, and side effects. Includes the steps to properly store meds and maintain the prescription regimen.  Written material given at graduation.   Intimacy: - Group verbal instruction through game format to discuss how heart and lung disease can affect sexual intimacy. Written material given at graduation..   Know Your Numbers and Heart Failure: - Group verbal and visual instruction to discuss disease risk factors for cardiac and pulmonary disease and treatment options.  Reviews associated critical values for Overweight/Obesity, Hypertension, Cholesterol, and Diabetes.  Discusses basics of heart failure: signs/symptoms and treatments.  Introduces Heart Failure Zone chart for action plan for heart failure.  Written material given at graduation.   Infection Prevention: - Provides verbal and written material to individual with discussion of infection control including proper hand washing and proper equipment cleaning during exercise session. Flowsheet Row [facility]  Date 01/15/21  [facility]  Instruction Review Code 1- Verbalizes Understanding       Falls Prevention: - Provides verbal and written material to individual with discussion of falls prevention and safety. Flowsheet [facility]  Date 01/15/21  [facility]  Instruction Review Code 1- Verbalizes Understanding       Other: -Provides group and verbal instruction on various topics (see comments)   Knowledge Questionnaire Score:   Knowledge Questionnaire Score - 01/15/21 1146       Knowledge Questionnaire Score   Pre Score 24/26 angina, exercise             Core Components/Risk Factors/Patient Goals at Admission:  Personal Goals and Risk Factors at Admission - 01/15/21 1147       Core Components/Risk Factors/Patient Goals on  Admission    Weight Management Yes;Weight Loss;Obesity    Intervention Weight Management: Develop a combined nutrition and exercise program designed to reach desired caloric intake, while maintaining appropriate intake of nutrient and fiber, sodium and fats, and appropriate energy expenditure required for the weight goal.;Weight Management: Provide education and appropriate resources to help participant work on and attain dietary goals.;Weight Management/Obesity: Establish reasonable short term and long term weight goals.;Obesity: Provide education and appropriate resources to help participant work on and attain dietary goals.    Admit Weight 244 lb 12.8 oz (111 kg)    Goal Weight: Short Term 240 lb (108.9 kg)    Goal Weight: Long Term 230 lb (104.3 kg)    Expected Outcomes Short Term: Continue to assess and modify interventions until short term weight is achieved;Long Term: Adherence to nutrition and physical activity/exercise program aimed toward attainment of established weight goal;Weight Loss: Understanding of general recommendations for a balanced deficit meal plan, which promotes 1-2 lb weight loss per week and includes a negative energy balance of [phone] kcal/d;Understanding of distribution of calorie intake throughout the day with the consumption of 4-5 meals/snacks;Understanding recommendations for meals to include 15-35% energy as protein, 25-35% energy from fat, 35-60% energy from carbohydrates, less than  of dietary cholesterol, 20-35 gm of total fiber daily    Diabetes Yes    Intervention Provide education about signs/symptoms and action to take for hypo/hyperglycemia.;Provide  education about proper nutrition, including hydration, and aerobic/resistive exercise prescription along with prescribed medications to achieve blood glucose in normal ranges: Fasting glucose 65-99 mg/dL    Expected Outcomes Short Term: Participant verbalizes understanding of the signs/symptoms and immediate care of hyper/hypoglycemia, proper foot care and importance of medication, aerobic/resistive exercise and nutrition plan for blood glucose control.;Long Term: Attainment of HbA1C < 7%.    Heart Failure Yes    Intervention Provide a combined exercise and nutrition program that is supplemented with education, support and counseling about heart failure. Directed toward relieving symptoms such as shortness of breath, decreased exercise tolerance, and extremity edema.    Expected Outcomes Improve functional capacity of life;Short term: Attendance in program 2-3 days a week with increased exercise capacity. Reported lower sodium intake. Reported increased fruit and vegetable intake. Reports medication compliance.;Short term: Daily weights obtained and reported for increase. Utilizing diuretic protocols set by physician.;Long term: Adoption of self-care skills and reduction of barriers for early signs and symptoms recognition and intervention leading to self-care maintenance.    Hypertension Yes    Intervention Provide education on lifestyle modifcations including regular physical activity/exercise, weight management, moderate sodium restriction and increased consumption of fresh fruit, vegetables, and low fat dairy, alcohol moderation, and smoking cessation.;Monitor prescription use compliance.    Expected Outcomes Short Term: Continued assessment and intervention until BP is < 140/10mm HG in hypertensive participants. < 130/34mm HG in hypertensive participants with diabetes, heart failure or chronic kidney disease.;Long Term: Maintenance of blood pressure at goal levels.    Lipids Yes    Intervention Provide  education and support for participant on nutrition & aerobic/resistive exercise along with prescribed medications to achieve LDL 70mg , HDL >40mg .    Expected Outcomes Long Term: Cholesterol controlled with medications as prescribed, with individualized exercise RX and with personalized nutrition plan. Value goals: LDL < , HDL > 40 mg.;Short Term: Participant states understanding of desired cholesterol values and is compliant with medications prescribed. Participant is following exercise prescription and nutrition guidelines.  Education:Diabetes - Individual verbal and written instruction to review signs/symptoms of diabetes, desired ranges of glucose level fasting, after meals and with exercise. Acknowledge that pre and post exercise glucose checks will be done for 3 sessions at entry of program. Flowsheet Row Cardiac Rehab from 01/22/2021 in [facility] Cardiac and Pulmonary Rehab  Date 01/15/21  Educator [facility]  Instruction Review Code 1- Verbalizes Understanding       Core Components/Risk Factors/Patient Goals Review:    Core Components/Risk Factors/Patient Goals at Discharge (Final Review):    ITP Comments:  ITP Comments     Row Name 01/08/21 1425 01/15/21 1140 01/27/21 1000 01/29/21 0725     ITP Comments Completed virtual orientation today.  EP evaluation is scheduled for Wednesday 01/15/21 at 930am.  Documentation for diagnosis can be found in [facility] encounter 12/20/20. Completed and gym orientation. Initial ITP created and sent for review to Dr. Bethann Punches, Medical Director. Jerline Pain. did not complete his rehab session.  Blood sugar is /DL He has asked his provider for a  meter like the Freestyle.   He was advised to call his provider and let them know he does not have a working meter and his blood sugar was elevated this AM.  He normally has been arrive with blood sugar in 200 range. 30 Day review completed. Medical Director ITP review done, changes made as  directed, and signed approval by Medical Director.             Comments:

## 2021-01-29 NOTE — Progress Notes (Signed)
Daily Session Note  Patient Details  Name: Aaron [name]. MRN: [id number] Date of Birth: [birth year] Referring Provider:   Flowsheet Row Cardiac Rehab from 01/15/2021 in [facility] Cardiac and Pulmonary Rehab  Referring Provider Serafina Royals MD       Encounter Date: 01/29/2021  Check In:  Session Check In - 01/29/21 0946       Check-In   Supervising physician immediately available to respond to emergencies See telemetry face sheet for immediately available ER MD    Location [facility]-Cardiac & Pulmonary Rehab    Staff Present Birdie Sons, MPA, Nino Glow, MS, ASCM CEP, Exercise Physiologist;Amanda Oletta Darter, BA, ACSM CEP, Exercise Physiologist    Virtual Visit No    Medication changes reported     No    Fall or balance concerns reported    No    Warm-up and Cool-down Performed on first and last piece of equipment    Resistance Training Performed Yes    VAD Patient? No    PAD/SET Patient? No      Pain Assessment   Currently in Pain? No/denies                Social History   Tobacco Use  Smoking Status Former  Smokeless Tobacco Former  Tobacco Comments   7/20 quit 35 years ago    Goals Met:  Independence with exercise equipment Exercise tolerated well No report of cardiac concerns or symptoms Strength training completed today  Goals Unmet:  Not Applicable  Comments: Pt able to follow exercise prescription today without complaint.  Will continue to monitor for progression.    Dr. Emily Filbert is Medical Director for [place].  Dr. Ottie Glazier is Medical Director for [facility] Pulmonary Rehabilitation.

## 2021-02-03 ENCOUNTER — Encounter: Payer: Medicare HMO | Admitting: *Deleted

## 2021-02-03 ENCOUNTER — Other Ambulatory Visit: Payer: Self-pay

## 2021-02-03 DIAGNOSIS — I214 Non-ST elevation (NSTEMI) myocardial infarction: Secondary | ICD-10-CM

## 2021-02-03 DIAGNOSIS — Z955 Presence of coronary angioplasty implant and graft: Secondary | ICD-10-CM

## 2021-02-03 NOTE — Progress Notes (Signed)
Daily Session Note  Patient Details  Name: Aaron [name]. MRN: [id number] Date of Birth: 09-29-1945 Referring Provider:   Flowsheet Row Cardiac Rehab from 01/15/2021 in [facility] Cardiac and Pulmonary Rehab  Referring Provider Serafina Royals MD       Encounter Date: 02/03/2021  Check In:  Session Check In - 02/03/21 1026       Check-In   Supervising physician immediately available to respond to emergencies See telemetry face sheet for immediately available ER MD    Location [facility]-Cardiac & Pulmonary Rehab    Staff Present Renita Papa, RN BSN;Joseph [place], RCP,RRT,BSRT;Kelly [place], [place], ACSM CEP, Exercise Physiologist    Virtual Visit No    Medication changes reported     No    Fall or balance concerns reported    No    Warm-up and Cool-down Performed on first and last piece of equipment    Resistance Training Performed Yes    VAD Patient? No    PAD/SET Patient? No      Pain Assessment   Currently in Pain? No/denies                Social History   Tobacco Use  Smoking Status Former  Smokeless Tobacco Former  Tobacco Comments   7/20 quit 35 years ago    Goals Met:  Independence with exercise equipment Exercise tolerated well No report of cardiac concerns or symptoms Strength training completed today  Goals Unmet:  Not Applicable  Comments: Pt able to follow exercise prescription today without complaint.  Will continue to monitor for progression.    Dr. Emily Filbert is Medical Director for [place].  Dr. Ottie Glazier is Medical Director for [facility] Pulmonary Rehabilitation.

## 2021-02-05 ENCOUNTER — Other Ambulatory Visit: Payer: Self-pay

## 2021-02-05 DIAGNOSIS — Z955 Presence of coronary angioplasty implant and graft: Secondary | ICD-10-CM

## 2021-02-05 DIAGNOSIS — I214 Non-ST elevation (NSTEMI) myocardial infarction: Secondary | ICD-10-CM | POA: Diagnosis not present

## 2021-02-05 NOTE — Progress Notes (Signed)
Daily Session Note  Patient Details  Name: Aaron [name]. MRN: [id number] Date of Birth: 1945-07-10 Referring Provider:   Flowsheet Row Cardiac Rehab from 01/15/2021 in [facility] Cardiac and Pulmonary Rehab  Referring Provider Serafina Royals MD       Encounter Date: 02/05/2021  Check In:  Session Check In - 02/05/21 0948       Check-In   Supervising physician immediately available to respond to emergencies See telemetry face sheet for immediately available ER MD    Location [facility]-Cardiac & Pulmonary Rehab    Staff Present Birdie Sons, MPA, RN;Laureen Owens [place], BS, RRT, CPFT;Joseph [place], [place]    Virtual Visit No    Medication changes reported     No    Fall or balance concerns reported    No    Warm-up and Cool-down Performed on first and last piece of equipment    Resistance Training Performed Yes    VAD Patient? No    PAD/SET Patient? No      Pain Assessment   Currently in Pain? No/denies                Social History   Tobacco Use  Smoking Status Former  Smokeless Tobacco Former  Tobacco Comments   7/20 quit 35 years ago    Goals Met:  Independence with exercise equipment Exercise tolerated well No report of cardiac concerns or symptoms Strength training completed today  Goals Unmet:  Not Applicable  Comments: Pt able to follow exercise prescription today without complaint.  Will continue to monitor for progression.    Dr. Emily Filbert is Medical Director for [place].  Dr. Ottie Glazier is Medical Director for [facility] Pulmonary Rehabilitation.

## 2021-02-07 ENCOUNTER — Other Ambulatory Visit: Payer: Self-pay

## 2021-02-07 ENCOUNTER — Encounter: Payer: Medicare HMO | Admitting: *Deleted

## 2021-02-07 DIAGNOSIS — I214 Non-ST elevation (NSTEMI) myocardial infarction: Secondary | ICD-10-CM

## 2021-02-07 DIAGNOSIS — Z955 Presence of coronary angioplasty implant and graft: Secondary | ICD-10-CM

## 2021-02-07 NOTE — Progress Notes (Signed)
Daily Session Note  Patient Details  Name: Aaron [name]. MRN: [id number] Date of Birth: 1945/11/12 Referring Provider:   Flowsheet Row Cardiac Rehab from 01/15/2021 in [facility] Cardiac and Pulmonary Rehab  Referring Provider Serafina Royals MD       Encounter Date: 02/07/2021  Check In:  Session Check In - 02/07/21 0955       Check-In   Supervising physician immediately available to respond to emergencies See telemetry face sheet for immediately available ER MD    Location [facility]-Cardiac & Pulmonary Rehab    Staff Present Renita Papa, RN BSN;Joseph Tessie Fass, RCP,RRT,BSRT;Kelly [place], MPA, RN    Virtual Visit No    Medication changes reported     No    Fall or balance concerns reported    No    Warm-up and Cool-down Performed on first and last piece of equipment    Resistance Training Performed Yes    VAD Patient? No    PAD/SET Patient? No      Pain Assessment   Currently in Pain? No/denies                Social History   Tobacco Use  Smoking Status Former  Smokeless Tobacco Former  Tobacco Comments   7/20 quit 35 years ago    Goals Met:  Independence with exercise equipment Exercise tolerated well No report of cardiac concerns or symptoms Strength training completed today  Goals Unmet:  Not Applicable  Comments: Pt able to follow exercise prescription today without complaint.  Will continue to monitor for progression.    Dr. Emily Filbert is Medical Director for [place].  Dr. Ottie Glazier is Medical Director for [facility] Pulmonary Rehabilitation.

## 2021-02-10 ENCOUNTER — Other Ambulatory Visit: Payer: Self-pay

## 2021-02-10 ENCOUNTER — Encounter: Payer: Medicare HMO | Admitting: *Deleted

## 2021-02-10 DIAGNOSIS — Z955 Presence of coronary angioplasty implant and graft: Secondary | ICD-10-CM | POA: Diagnosis not present

## 2021-02-10 DIAGNOSIS — I214 Non-ST elevation (NSTEMI) myocardial infarction: Secondary | ICD-10-CM

## 2021-02-10 NOTE — Progress Notes (Signed)
Daily Session Note  Patient Details  Name: Aaron [name]. MRN: [id number] Date of Birth: 03-Dec-1945 Referring Provider:   Flowsheet Row Cardiac Rehab from 01/15/2021 in [facility] Cardiac and Pulmonary Rehab  Referring Provider Serafina Royals MD       Encounter Date: 02/10/2021  Check In:  Session Check In - 02/10/21 1043       Check-In   Supervising physician immediately available to respond to emergencies See telemetry face sheet for immediately available ER MD    Location [facility]-Cardiac & Pulmonary Rehab    Staff Present Renita Papa, RN BSN;Joseph [place], RCP,RRT,BSRT;Kelly [place], BS, ACSM CEP, Exercise Physiologist;Susanne Bice, RN, BSN, Dorris Singh, MPA, RN    Virtual Visit No    Medication changes reported     No    Fall or balance concerns reported    No    Resistance Training Performed Yes    VAD Patient? No    PAD/SET Patient? No      Pain Assessment   Currently in Pain? No/denies                Social History   Tobacco Use  Smoking Status Former  Smokeless Tobacco Former  Tobacco Comments   7/20 quit 35 years ago    Goals Met:  Independence with exercise equipment Exercise tolerated well No report of cardiac concerns or symptoms Strength training completed today  Goals Unmet:  Not Applicable  Comments: Pt able to follow exercise prescription today without complaint.  Will continue to monitor for progression.    Dr. Emily Filbert is Medical Director for [place].  Dr. Ottie Glazier is Medical Director for [facility] Pulmonary Rehabilitation.

## 2021-02-12 ENCOUNTER — Other Ambulatory Visit: Payer: Self-pay

## 2021-02-12 DIAGNOSIS — Z955 Presence of coronary angioplasty implant and graft: Secondary | ICD-10-CM | POA: Diagnosis not present

## 2021-02-12 DIAGNOSIS — I214 Non-ST elevation (NSTEMI) myocardial infarction: Secondary | ICD-10-CM

## 2021-02-12 NOTE — Progress Notes (Signed)
Daily Session Note  Patient Details  Name: Aaron [name]. MRN: [id number] Date of Birth: [birth year] Referring Provider:   Flowsheet Row Cardiac Rehab from 01/15/2021 in [facility] Cardiac and Pulmonary Rehab  Referring Provider Serafina Royals MD       Encounter Date: 02/12/2021  Check In:  Session Check In - 02/12/21 1027       Check-In   Supervising physician immediately available to respond to emergencies See telemetry face sheet for immediately available ER MD    Location [facility]-Cardiac & Pulmonary Rehab    Staff Present Birdie Sons, MPA, [place]veria Rising, BA, ACSM CEP, Exercise Physiologist;Joseph [place], RCP,RRT,BSRT;Melissa [place], RDN, LDN    Virtual Visit No    Medication changes reported     No    Fall or balance concerns reported    No    Warm-up and Cool-down Performed on first and last piece of equipment    Resistance Training Performed Yes    VAD Patient? No    PAD/SET Patient? No      Pain Assessment   Currently in Pain? No/denies                Social History   Tobacco Use  Smoking Status Former  Smokeless Tobacco Former  Tobacco Comments   7/20 quit 35 years ago    Goals Met:  Independence with exercise equipment Exercise tolerated well No report of cardiac concerns or symptoms Strength training completed today  Goals Unmet:  Not Applicable  Comments: Pt able to follow exercise prescription today without complaint.  Will continue to monitor for progression.    Dr. Emily Filbert is Medical Director for [place].  Dr. Ottie Glazier is Medical Director for [facility] Pulmonary Rehabilitation.

## 2021-02-17 DIAGNOSIS — I129 Hypertensive chronic kidney disease with stage 1 through stage 4 chronic kidney disease, or unspecified chronic kidney disease: Secondary | ICD-10-CM | POA: Diagnosis not present

## 2021-02-17 DIAGNOSIS — N189 Chronic kidney disease, unspecified: Secondary | ICD-10-CM | POA: Diagnosis not present

## 2021-02-17 DIAGNOSIS — R059 Cough, unspecified: Secondary | ICD-10-CM | POA: Diagnosis not present

## 2021-02-17 DIAGNOSIS — N3001 Acute cystitis with hematuria: Secondary | ICD-10-CM | POA: Diagnosis not present

## 2021-02-17 DIAGNOSIS — Z7984 Long term (current) use of oral hypoglycemic drugs: Secondary | ICD-10-CM | POA: Diagnosis not present

## 2021-02-17 DIAGNOSIS — Z7982 Long term (current) use of aspirin: Secondary | ICD-10-CM | POA: Diagnosis not present

## 2021-02-17 DIAGNOSIS — I252 Old myocardial infarction: Secondary | ICD-10-CM | POA: Diagnosis not present

## 2021-02-17 DIAGNOSIS — R918 Other nonspecific abnormal finding of lung field: Secondary | ICD-10-CM | POA: Diagnosis not present

## 2021-02-17 DIAGNOSIS — R109 Unspecified abdominal pain: Secondary | ICD-10-CM | POA: Diagnosis not present

## 2021-02-17 DIAGNOSIS — R300 Dysuria: Secondary | ICD-10-CM | POA: Diagnosis not present

## 2021-02-17 DIAGNOSIS — Z87891 Personal history of nicotine dependence: Secondary | ICD-10-CM | POA: Diagnosis not present

## 2021-02-17 DIAGNOSIS — R509 Fever, unspecified: Secondary | ICD-10-CM | POA: Diagnosis not present

## 2021-02-17 DIAGNOSIS — E1122 Type 2 diabetes mellitus with diabetic chronic kidney disease: Secondary | ICD-10-CM | POA: Diagnosis not present

## 2021-02-17 DIAGNOSIS — I2510 Atherosclerotic heart disease of native coronary artery without angina pectoris: Secondary | ICD-10-CM | POA: Diagnosis not present

## 2021-02-17 DIAGNOSIS — R079 Chest pain, unspecified: Secondary | ICD-10-CM | POA: Diagnosis not present

## 2021-02-17 DIAGNOSIS — Z79899 Other long term (current) drug therapy: Secondary | ICD-10-CM | POA: Diagnosis not present

## 2021-02-17 DIAGNOSIS — J984 Other disorders of lung: Secondary | ICD-10-CM | POA: Diagnosis not present

## 2021-02-17 DIAGNOSIS — K5900 Constipation, unspecified: Secondary | ICD-10-CM | POA: Diagnosis not present

## 2021-02-19 DIAGNOSIS — R509 Fever, unspecified: Secondary | ICD-10-CM | POA: Diagnosis not present

## 2021-02-19 DIAGNOSIS — N453 Epididymo-orchitis: Secondary | ICD-10-CM | POA: Diagnosis not present

## 2021-02-19 DIAGNOSIS — I2510 Atherosclerotic heart disease of native coronary artery without angina pectoris: Secondary | ICD-10-CM | POA: Diagnosis not present

## 2021-02-19 DIAGNOSIS — N50811 Right testicular pain: Secondary | ICD-10-CM | POA: Diagnosis not present

## 2021-02-19 DIAGNOSIS — I129 Hypertensive chronic kidney disease with stage 1 through stage 4 chronic kidney disease, or unspecified chronic kidney disease: Secondary | ICD-10-CM | POA: Diagnosis not present

## 2021-02-19 DIAGNOSIS — N5089 Other specified disorders of the male genital organs: Secondary | ICD-10-CM | POA: Diagnosis not present

## 2021-02-19 DIAGNOSIS — K4090 Unilateral inguinal hernia, without obstruction or gangrene, not specified as recurrent: Secondary | ICD-10-CM | POA: Diagnosis not present

## 2021-02-19 DIAGNOSIS — R110 Nausea: Secondary | ICD-10-CM | POA: Diagnosis not present

## 2021-02-19 DIAGNOSIS — N189 Chronic kidney disease, unspecified: Secondary | ICD-10-CM | POA: Diagnosis not present

## 2021-02-19 DIAGNOSIS — K5900 Constipation, unspecified: Secondary | ICD-10-CM | POA: Diagnosis not present

## 2021-02-19 DIAGNOSIS — Z882 Allergy status to sulfonamides status: Secondary | ICD-10-CM | POA: Diagnosis not present

## 2021-02-19 DIAGNOSIS — N433 Hydrocele, unspecified: Secondary | ICD-10-CM | POA: Diagnosis not present

## 2021-02-19 DIAGNOSIS — R300 Dysuria: Secondary | ICD-10-CM | POA: Diagnosis not present

## 2021-02-19 DIAGNOSIS — E1122 Type 2 diabetes mellitus with diabetic chronic kidney disease: Secondary | ICD-10-CM | POA: Diagnosis not present

## 2021-02-20 DIAGNOSIS — Z9181 History of falling: Secondary | ICD-10-CM | POA: Diagnosis not present

## 2021-02-20 DIAGNOSIS — Z1331 Encounter for screening for depression: Secondary | ICD-10-CM | POA: Diagnosis not present

## 2021-02-20 DIAGNOSIS — E669 Obesity, unspecified: Secondary | ICD-10-CM | POA: Diagnosis not present

## 2021-02-20 DIAGNOSIS — Z0000 Encounter for general adult medical examination without abnormal findings: Secondary | ICD-10-CM | POA: Diagnosis not present

## 2021-02-20 DIAGNOSIS — E785 Hyperlipidemia, unspecified: Secondary | ICD-10-CM | POA: Diagnosis not present

## 2021-02-21 ENCOUNTER — Encounter: Payer: Medicare HMO | Attending: Internal Medicine

## 2021-02-21 DIAGNOSIS — I214 Non-ST elevation (NSTEMI) myocardial infarction: Secondary | ICD-10-CM | POA: Insufficient documentation

## 2021-02-21 DIAGNOSIS — Z955 Presence of coronary angioplasty implant and graft: Secondary | ICD-10-CM | POA: Insufficient documentation

## 2021-02-25 DIAGNOSIS — I6523 Occlusion and stenosis of bilateral carotid arteries: Secondary | ICD-10-CM | POA: Diagnosis not present

## 2021-02-25 DIAGNOSIS — E782 Mixed hyperlipidemia: Secondary | ICD-10-CM | POA: Diagnosis not present

## 2021-02-25 DIAGNOSIS — I10 Essential (primary) hypertension: Secondary | ICD-10-CM | POA: Diagnosis not present

## 2021-02-25 DIAGNOSIS — E118 Type 2 diabetes mellitus with unspecified complications: Secondary | ICD-10-CM | POA: Diagnosis not present

## 2021-02-25 DIAGNOSIS — I25118 Atherosclerotic heart disease of native coronary artery with other forms of angina pectoris: Secondary | ICD-10-CM | POA: Diagnosis not present

## 2021-02-25 DIAGNOSIS — I255 Ischemic cardiomyopathy: Secondary | ICD-10-CM | POA: Diagnosis not present

## 2021-02-26 ENCOUNTER — Encounter: Payer: Self-pay | Admitting: *Deleted

## 2021-02-26 DIAGNOSIS — Z955 Presence of coronary angioplasty implant and graft: Secondary | ICD-10-CM

## 2021-02-26 DIAGNOSIS — I214 Non-ST elevation (NSTEMI) myocardial infarction: Secondary | ICD-10-CM

## 2021-02-26 NOTE — Progress Notes (Signed)
Cardiac Individual Treatment Plan  Patient Details  Name: Aaron [name]. MRN: [id number] Date of Birth: 1946-04-09 Referring Provider:   Flowsheet Row Cardiac Rehab from 01/15/2021 in [facility] Cardiac and Pulmonary Rehab  Referring Provider Serafina Royals MD       Initial Encounter Date:  Flowsheet Row Cardiac Rehab from 01/15/2021 in [facility] Cardiac and Pulmonary Rehab  Date 01/15/21       Visit Diagnosis: NSTEMI (non-ST elevated myocardial infarction) [facility])  Status post coronary artery stent placement  Patient's Home Medications on Admission:  Current Outpatient Medications:    amLODipine (NORVASC) 5 MG tablet, Take by mouth., Disp: , Rfl:    aspirin 81 MG chewable tablet, Chew 1 tablet (81 mg total) by mouth daily., Disp:  , Rfl:    carvedilol (COREG) 3.125 MG tablet, Take 1 tablet (3.125 mg total) by mouth 2 (two) times daily with a meal., Disp: 60 tablet, Rfl: 0   cholecalciferol (VITAMIN D3) 25 MCG (1000 UNIT) tablet, Take 1,000 Units by mouth daily., Disp: , Rfl:    Coenzyme Q10 100 MG capsule, Take by mouth., Disp: , Rfl:    ezetimibe (ZETIA) 10 MG tablet, Take 1 tablet (10 mg total) by mouth daily., Disp: 30 tablet, Rfl: 0   ferrous sulfate 325 (65 FE) MG tablet, Take by mouth., Disp: , Rfl:    glipiZIDE (GLUCOTROL XL) 10 MG 24 hr tablet, , Disp: , Rfl:    glucosamine-chondroitin 500-400 MG tablet, Take 1 tablet by mouth 3 (three) times daily., Disp: , Rfl:    ketoconazole (NIZORAL) 2 % shampoo, Apply 1 application topically 2 (two) times a week., Disp: , Rfl: 3   MAGNESIUM OXIDE PO, Take by mouth., Disp: , Rfl:    MANGANESE PO, Take by mouth., Disp: , Rfl:    Multiple Vitamin (MULTIVITAMIN PO), Take by mouth., Disp: , Rfl:    multivitamin-lutein (OCUVITE-LUTEIN) CAPS capsule, Take 1 capsule by mouth daily., Disp: , Rfl:    nitroGLYCERIN (NITROSTAT) 0.4 MG SL tablet, Take 0.4 mg by mouth every 5 (five) minutes as needed for chest pain. , Disp: , Rfl:    Omega-3 Fatty  Acids (FISH OIL) 1200 MG CAPS, Take 2,400 mg by mouth daily. , Disp: , Rfl:    pantoprazole (PROTONIX) 40 MG tablet, Take 1 tablet (40 mg total) by mouth 2 (two) times daily before a meal., Disp: 60 tablet, Rfl: 0   phytonadione (VITAMIN K) 2 MG/ML SOLN oral solution, Take by mouth., Disp: , Rfl:    ticagrelor (BRILINTA) 90 MG TABS tablet, Take 1 tablet (90 mg total) by mouth 2 (two) times daily., Disp: 60 tablet, Rfl: 0   traMADol (ULTRAM) 50 MG tablet, Take 50 mg by mouth every 6 (six) hours as needed for moderate pain., Disp: , Rfl:    vitamin B-12 (CYANOCOBALAMIN) 1000 MCG tablet, Take 1,000 mcg by mouth daily., Disp: , Rfl:   Past Medical History: Past Medical History:  Diagnosis Date   Cellulitis    Diabetes mellitus without complication (HCC)    MI, old     Tobacco Use: Social History   Tobacco Use  Smoking Status Former  Smokeless Tobacco Former  Tobacco Comments   7/20 quit 35 years ago    Labs: Recent Review Flowsheet Data     Labs for ITP Cardiac and Pulmonary Rehab Latest Ref Rng & Units 09/13/2014 06/12/2019 06/13/2019 12/20/2020 12/21/2020   Cholestrol 0 - 200 mg/dL - - 148 - 136   LDLCALC 0 -  99 mg/dL - - UNABLE TO CALCULATE IF TRIGLYCERIDE OVER 400 mg/dL - UNABLE TO CALCULATE IF TRIGLYCERIDE OVER 400 mg/dL   LDLDIRECT 0 - 99 mg/dL - - 65.4 - 49.6   HDL >40 mg/dL - - 25(L) - 24(L)   Trlycerides <150 mg/dL - - 505(H) - 529(H)   Hemoglobin A1c 4.8 - 5.6 % 6.1(H) 10.6(H) 9.6(H) 8.1(H) -        Exercise Target Goals: Exercise Program Goal: Individual exercise prescription set using results from initial 6 min walk test and THRR while considering  patient's activity barriers and safety.   Exercise Prescription Goal: Initial exercise prescription builds to 30-45 minutes a day of aerobic activity, 2-3 days per week.  Home exercise guidelines will be given to patient during program as part of exercise prescription that the participant will acknowledge.   Education:  Aerobic Exercise: - Group verbal and visual presentation on the components of exercise prescription. Introduces F.I.T.T principle from ACSM for exercise prescriptions.  Reviews F.I.T.T. principles of aerobic exercise including progression. Written material given at graduation. Flowsheet Row Cardiac Rehab from 02/12/2021 in [facility] Cardiac and Pulmonary Rehab  Date 02/12/21  Educator [facility]  Instruction Review Code 1- Verbalizes Understanding       Education: Resistance Exercise: - Group verbal and visual presentation on the components of exercise prescription. Introduces F.I.T.T principle from ACSM for exercise prescriptions  Reviews F.I.T.T. principles of resistance exercise including progression. Written material given at graduation.    Education: Exercise & Equipment Safety: - Individual verbal instruction and demonstration of equipment use and safety with use of the equipment. Flowsheet Row Cardiac Rehab from 02/12/2021 in [facility] Cardiac and Pulmonary Rehab  Date 01/15/21  Educator [facility]  Instruction Review Code 1- Verbalizes Understanding       Education: Exercise Physiology & General Exercise Guidelines: - Group verbal and written instruction with models to review the exercise physiology of the cardiovascular system and associated critical values. Provides general exercise guidelines with specific guidelines to those with heart or lung disease.  Flowsheet Row Cardiac Rehab from 02/12/2021 in [facility] Cardiac and Pulmonary Rehab  Date 02/05/21  Educator [facility]  Instruction Review Code 1- Verbalizes Understanding       Education: Flexibility, Balance, Mind/Body Relaxation: - Group verbal and visual presentation with interactive activity on the components of exercise prescription. Introduces F.I.T.T principle from ACSM for exercise prescriptions. Reviews F.I.T.T. principles of flexibility and balance exercise training including progression. Also discusses the mind body connection.  Reviews various  relaxation techniques to help reduce and manage stress (i.e. Deep breathing, progressive muscle relaxation, and visualization). Balance handout provided to take home. Written material given at graduation.   Activity Barriers & Risk Stratification:  Activity Barriers & Cardiac Risk Stratification - 01/15/21 1142       Activity Barriers & Cardiac Risk Stratification   Activity Barriers Deconditioning;Muscular Weakness;History of Falls;Joint Problems;Other (comment);Left Knee Replacement;Shortness of Breath;Back Problems    Comments bilateral shoulder surgeries, hips hurt with walking    Cardiac Risk Stratification High             6 Minute Walk:  6 Minute Walk     Row Name 01/15/21 1141         6 Minute Walk   Phase Initial     Distance 895 feet     Walk Time 6 minutes     # of Rest Breaks 0     MPH 1.69     METS 2.34  RPE 13     Perceived Dyspnea  1     VO2 Peak 8.18     Symptoms Yes (comment)     Comments hip pain 3/10, chest pain 2/10, SOB     Resting HR 82 bpm     Resting BP 124/66     Resting Oxygen Saturation  98 %     Exercise Oxygen Saturation  during 6 min walk 96 %     Max Ex. HR 122 bpm     Max Ex. BP 196/74     2 Minute Post BP 148/70              Oxygen Initial Assessment:   Oxygen Re-Evaluation:   Oxygen Discharge (Final Oxygen Re-Evaluation):   Initial Exercise Prescription:  Initial Exercise Prescription - 01/15/21 1100       Date of Initial Exercise RX and Referring Provider   Date 01/15/21    Referring Provider Serafina Royals MD      Treadmill   MPH 1.5    Grade 0.5    Minutes 15    METs 2.25      Recumbant Elliptical   Level 1    RPM 50    Minutes 15    METs 2      REL-XR   Level 1    Speed 50    Minutes 15    METs 2      Prescription Details   Frequency (times per week) 3    Duration Progress to 30 minutes of continuous aerobic without signs/symptoms of physical distress      Intensity   THRR 40-80% of  Max Heartrate 107-132    Ratings of Perceived Exertion 11-13    Perceived Dyspnea 0-4      Progression   Progression Continue to progress workloads to maintain intensity without signs/symptoms of physical distress.      Resistance Training   Training Prescription Yes    Weight 4 lb    Reps 10-15             Perform Capillary Blood Glucose checks as needed.  Exercise Prescription Changes:   Exercise Prescription Changes     Row Name 01/15/21 1100 01/22/21 1300 02/05/21 1000 02/17/21 1100       Response to Exercise   Blood Pressure (Admit) 124/66 130/62 124/70 122/76    Blood Pressure (Exercise) 196/74  rck 148/70 150/70 110/80 124/74    Blood Pressure (Exit) 132/66 126/62 124/62 104/68    Heart Rate (Admit) 82 bpm 62 bpm 81 bpm 92 bpm    Heart Rate (Exercise) 122 bpm 110 bpm 107 bpm 113 bpm    Heart Rate (Exit) 83 bpm 71 bpm 82 bpm 68 bpm    Oxygen Saturation (Admit) 98 % -- -- --    Oxygen Saturation (Exercise) 96 % -- -- --    Rating of Perceived Exertion (Exercise) 13 11 12 13     Perceived Dyspnea (Exercise) 1 -- -- --    Symptoms hip pain 3/10, chest pain 2/10, SOB hip pain walking none none    Comments walk test results -- -- --    Duration -- Progress to 30 minutes of  aerobic without signs/symptoms of physical distress Progress to 30 minutes of  aerobic without signs/symptoms of physical distress Progress to 30 minutes of  aerobic without signs/symptoms of physical distress    Intensity -- THRR unchanged THRR unchanged THRR unchanged  Progression   Progression -- Continue to progress workloads to maintain intensity without signs/symptoms of physical distress. Continue to progress workloads to maintain intensity without signs/symptoms of physical distress. Continue to progress workloads to maintain intensity without signs/symptoms of physical distress.    Average METs -- 2.7 2.33 2.5         Resistance Training   Training Prescription -- Yes Yes Yes     Weight -- 4 lb 4 lb 4 lb    Reps -- 10-15 10-15 10-15         Interval Training   Interval Training -- -- No No         Treadmill   MPH -- 0.7 1.1 --    Grade -- -- 0 --    Minutes -- 15 15 --    METs -- -- 1.8 --         Recumbant Bike   Level -- -- 2 --    RPM -- -- 60 --    Watts -- -- 25 --    Minutes -- -- 15 --    METs -- -- 2.71 --         NuStep   Level -- -- 1 --    Minutes -- -- 15 --    METs -- -- 2.4 --         Arm Ergometer   Level -- -- -- 1    Minutes -- -- -- 15    METs -- -- -- 2         Recumbant Elliptical   Level -- -- 1 --    Minutes -- -- 15 --    METs -- -- 1.4 --         REL-XR   Level -- 1 -- --    Speed -- 50 -- --    Minutes -- 15 -- --    METs -- 2.7 -- --         Track   Laps -- -- -- 25    Minutes -- -- -- 15    METs -- -- -- 2.36             Exercise Comments:   Exercise Comments     Row Name 01/27/21 1000           Exercise Comments [employer]. did not complete his rehab session.  Blood sugar is $Remove'345mg'[id number]$ /DL He has asked his provider for a  meter like the Freestyle.   He was advised to call his provider and let them know he does not have a working meter and his blood sugar was elevated this AM.  He normally has been arrive with blood sugar in 200 range.                Exercise Goals and Review:   Exercise Goals     Row Name 01/15/21 1145             Exercise Goals   Increase Physical Activity Yes       Intervention Provide advice, education, support and counseling about physical activity/exercise needs.;Develop an individualized exercise prescription for aerobic and resistive training based on initial evaluation findings, risk stratification, comorbidities and participant's personal goals.       Expected Outcomes Short Term: Attend rehab on a regular basis to increase amount of physical activity.;Long Term: Add in home exercise to make exercise part of routine and to increase amount of  physical activity.;Long  Term: Exercising regularly at least 3-5 days a week.       Increase Strength and Stamina Yes       Intervention Provide advice, education, support and counseling about physical activity/exercise needs.;Develop an individualized exercise prescription for aerobic and resistive training based on initial evaluation findings, risk stratification, comorbidities and participant's personal goals.       Expected Outcomes Short Term: Increase workloads from initial exercise prescription for resistance, speed, and METs.;Short Term: Perform resistance training exercises routinely during rehab and add in resistance training at home;Long Term: Improve cardiorespiratory fitness, muscular endurance and strength as measured by increased METs and functional capacity (6MWT)       Able to understand and use rate of perceived exertion (RPE) scale Yes       Intervention Provide education and explanation on how to use RPE scale       Expected Outcomes Short Term: Able to use RPE daily in rehab to express subjective intensity level;Long Term:  Able to use RPE to guide intensity level when exercising independently       Able to understand and use Dyspnea scale Yes       Intervention Provide education and explanation on how to use Dyspnea scale       Expected Outcomes Short Term: Able to use Dyspnea scale daily in rehab to express subjective sense of shortness of breath during exertion;Long Term: Able to use Dyspnea scale to guide intensity level when exercising independently       Knowledge and understanding of Target Heart Rate Range (THRR) Yes       Intervention Provide education and explanation of THRR including how the numbers were predicted and where they are located for reference       Expected Outcomes Short Term: Able to state/look up THRR;Long Term: Able to use THRR to govern intensity when exercising independently;Short Term: Able to use daily as guideline for intensity in rehab       Able to  check pulse independently Yes       Intervention Provide education and demonstration on how to check pulse in carotid and radial arteries.;Review the importance of being able to check your own pulse for safety during independent exercise       Expected Outcomes Short Term: Able to explain why pulse checking is important during independent exercise;Long Term: Able to check pulse independently and accurately       Understanding of Exercise Prescription Yes       Intervention Provide education, explanation, and written materials on patient's individual exercise prescription       Expected Outcomes Short Term: Able to explain program exercise prescription;Long Term: Able to explain home exercise prescription to exercise independently                Exercise Goals Re-Evaluation :  Exercise Goals Re-Evaluation     Row Name 01/22/21 1308 02/05/21 1037 02/17/21 1141         Exercise Goal Re-Evaluation   Exercise Goals Review Increase Physical Activity;Increase Strength and Stamina;Able to understand and use rate of perceived exertion (RPE) scale Increase Physical Activity;Understanding of Exercise Prescription;Increase Strength and Stamina Increase Physical Activity;Increase Strength and Stamina     Comments First full day of exercise!  Patient was oriented to gym and equipment including functions, settings, policies, and procedures.  Patient's individual exercise prescription and treatment plan were reviewed.  All starting workloads were established based on the results of the 6 minute walk test done at initial  orientation visit.  The plan for exercise progression was also introduced and progression will be customized based on patient's performance and goals. Aaron [name] is tolerating exercise well the first couple sessions that he has been here. RPEs have been appropriate so far. We will continue to monitor his progression as he tries new machines. Aaron [name] has done well walking on the track.  He has worked  up to 27 laps!  Staff will monitor progress.     Expected Outcomes Short: Use RPE daily to regulate intensity. Long: Follow program prescription in THR. Short: Work on increasing loads as tolerated. Long: Continue to increase overall MET level Short:  continue to build stamina walking Long:  improve MET level              Discharge Exercise Prescription (Final Exercise Prescription Changes):  Exercise Prescription Changes - 02/17/21 1100       Response to Exercise   Blood Pressure (Admit) 122/76    Blood Pressure (Exercise) 124/74    Blood Pressure (Exit) 104/68    Heart Rate (Admit) 92 bpm    Heart Rate (Exercise) 113 bpm    Heart Rate (Exit) 68 bpm    Rating of Perceived Exertion (Exercise) 13    Symptoms none    Duration Progress to 30 minutes of  aerobic without signs/symptoms of physical distress    Intensity THRR unchanged      Progression   Progression Continue to progress workloads to maintain intensity without signs/symptoms of physical distress.    Average METs 2.5      Resistance Training   Training Prescription Yes    Weight 4 lb    Reps 10-15      Interval Training   Interval Training No      Arm Ergometer   Level 1    Minutes 15    METs 2      Track   Laps 25    Minutes 15    METs 2.36             Nutrition:  Target Goals: Understanding of nutrition guidelines, daily intake of sodium '1500mg'$ , cholesterol '200mg'$ , calories 30% from fat and 7% or less from saturated fats, daily to have 5 or more servings of fruits and vegetables.  Education: All About Nutrition: -Group instruction provided by verbal, written material, interactive activities, discussions, models, and posters to present general guidelines for heart healthy nutrition including fat, fiber, MyPlate, the role of sodium in heart healthy nutrition, utilization of the nutrition label, and utilization of this knowledge for meal planning. Follow up email sent as well. Written material given at  graduation. Flowsheet Row Cardiac Rehab from 02/12/2021 in [facility] Cardiac and Pulmonary Rehab  Education need identified 01/15/21       Biometrics:  Pre Biometrics - 01/15/21 1145       Pre Biometrics   Height 5' 11.6" (1.819 m)    Weight 244 lb 12.8 oz (111 kg)    BMI (Calculated) 33.56    Single Leg Stand 30 seconds              Nutrition Therapy Plan and Nutrition Goals:  Nutrition Therapy & Goals - 02/03/21 1003       Nutrition Therapy   RD appointment deferred Yes   Tyree is not interested in talking with RD at this time - he did want handout - heart healthy guidelines provided            Nutrition Assessments:  MEDIFICTS  Score Key: ?70 Need to make dietary changes  40-70 Heart Healthy Diet ? 40 Therapeutic Level Cholesterol Diet  Flowsheet Row Cardiac Rehab from 01/15/2021 in [facility] Cardiac and Pulmonary Rehab  Picture Your Plate Total Score on Admission 64      Picture Your Plate Scores: <84 Unhealthy dietary pattern with much room for improvement. 41-50 Dietary pattern unlikely to meet recommendations for good health and room for improvement. 51-60 More healthful dietary pattern, with some room for improvement.  >60 Healthy dietary pattern, although there may be some specific behaviors that could be improved.    Nutrition Goals Re-Evaluation:   Nutrition Goals Discharge (Final Nutrition Goals Re-Evaluation):   Psychosocial: Target Goals: Acknowledge presence or absence of significant depression and/or stress, maximize coping skills, provide positive support system. Participant is able to verbalize types and ability to use techniques and skills needed for reducing stress and depression.   Education: Stress, Anxiety, and Depression - Group verbal and visual presentation to define topics covered.  Reviews how body is impacted by stress, anxiety, and depression.  Also discusses healthy ways to reduce stress and to treat/manage anxiety and depression.   Written material given at graduation. Flowsheet Row Cardiac Rehab from 02/12/2021 in [facility] Cardiac and Pulmonary Rehab  Date 01/29/21  Educator [facility]  Instruction Review Code 1- Verbalizes Understanding       Education: Sleep Hygiene -Provides group verbal and written instruction about how sleep can affect your health.  Define sleep hygiene, discuss sleep cycles and impact of sleep habits. Review good sleep hygiene tips.    Initial Review & Psychosocial Screening:  Initial Psych Review & Screening - 01/08/21 1416       Initial Review   Current issues with Current Sleep Concerns;Current Stress Concerns    Source of Stress Concerns Chronic Illness;Financial    Comments if he naps during day he has a hard time getting to sleep, finances and health are stable but not great      Family Dynamics   Good Support System? Yes   daughter calls to check in, son lives in [place] but rest live nearby     Barriers   Psychosocial barriers to participate in program The patient should benefit from training in stress management and relaxation.;Psychosocial barriers identified (see note)      Screening Interventions   Interventions Encouraged to exercise;Provide feedback about the scores to participant;To provide support and resources with identified psychosocial needs    Expected Outcomes Short Term goal: Utilizing psychosocial counselor, staff and physician to assist with identification of specific Stressors or current issues interfering with healing process. Setting desired goal for each stressor or current issue identified.;Long Term Goal: Stressors or current issues are controlled or eliminated.;Long Term goal: The participant improves quality of Life and PHQ9 Scores as seen by post scores and/or verbalization of changes;Short Term goal: Identification and review with participant of any Quality of Life or Depression concerns found by scoring the questionnaire.             Quality of Life Scores:    Quality of Life - 01/15/21 1145       Quality of Life   Select Quality of Life      Quality of Life Scores   Health/Function Pre 19.29 %    Socioeconomic Pre 28.29 %    Psych/Spiritual Pre 22.29 %    Family Pre 25.5 %    GLOBAL Pre 22.69 %  Scores of 19 and below usually indicate a poorer quality of life in these areas.  A difference of  2-3 points is a clinically meaningful difference.  A difference of 2-3 points in the total score of the Quality of Life Index has been associated with significant improvement in overall quality of life, self-image, physical symptoms, and general health in studies assessing change in quality of life.  PHQ-9: Recent Review Flowsheet Data     Depression screen [facility] 2/9 01/15/2021   Decreased Interest 1   Down, Depressed, Hopeless 0   PHQ - 2 Score 1   Altered sleeping 0   Tired, decreased energy 3   Change in appetite 0   Feeling bad or failure about yourself  0   Trouble concentrating 0   Moving slowly or fidgety/restless 0   Suicidal thoughts 0   PHQ-9 Score 4   Difficult doing work/chores Very difficult      Interpretation of Total Score  Total Score Depression Severity:  1-4 = Minimal depression, 5-9 = Mild depression, 10-14 = Moderate depression, 15-19 = Moderately severe depression, 20-27 = Severe depression   Psychosocial Evaluation and Intervention:  Psychosocial Evaluation - 01/08/21 1420       Psychosocial Evaluation & Interventions   Interventions Stress management education;Encouraged to exercise with the program and follow exercise prescription    Comments Aaron [name] is 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  He has never done rehab before with his prior events but really wants to get moving again and feel better overall.  He wants to rebuild his strength to be able to do what he would like at home.  He denies any depression or anxiety.  He usually sleeps well at night and gets about 8hrs a night.  If he  naps, he has a harder time getting to sleep at night.  He has a great support system as his son and daughter both live within 500 ft and check in often.  His son is currently in [place] by his wife checks in on him.  He also has a history of diabetes and kidney disease.  His kidneys are doing well and he is hoping to get a FreeStyle meter from his doctor to make checking his sugars easier.    Expected Outcomes Short: Start rehab to build strength Long: Attend regularly to build up stamina.    Continue Psychosocial Services  Follow up required by staff             Psychosocial Re-Evaluation:   Psychosocial Discharge (Final Psychosocial Re-Evaluation):   Vocational Rehabilitation: Provide vocational rehab assistance to qualifying candidates.   Vocational Rehab Evaluation & Intervention:   Education: Education Goals: Education classes will be provided on a variety of topics geared toward better understanding of heart health and risk factor modification. Participant will state understanding/return demonstration of topics presented as noted by education test scores.  Learning Barriers/Preferences:  Learning Barriers/Preferences - 01/08/21 1414       Learning Barriers/Preferences   Learning Barriers Sight   glasses   Learning Preferences None             General Cardiac Education Topics:  AED/CPR: - Group verbal and written instruction with the use of models to demonstrate the basic use of the AED with the basic ABC's of resuscitation.   Anatomy and Cardiac Procedures: - Group verbal and visual presentation and models provide information about basic cardiac anatomy and function. Reviews the testing methods done  to diagnose heart disease and the outcomes of the test results. Describes the treatment choices: Medical Management, Angioplasty, or Coronary Bypass Surgery for treating various heart conditions including Myocardial Infarction, Angina, Valve Disease, and Cardiac  Arrhythmias.  Written material given at graduation. Flowsheet Row Cardiac Rehab from 02/12/2021 in [facility] Cardiac and Pulmonary Rehab  Education need identified 01/15/21       Medication Safety: - Group verbal and visual instruction to review commonly prescribed medications for heart and lung disease. Reviews the medication, class of the drug, and side effects. Includes the steps to properly store meds and maintain the prescription regimen.  Written material given at graduation.   Intimacy: - Group verbal instruction through game format to discuss how heart and lung disease can affect sexual intimacy. Written material given at graduation.. Flowsheet Row Cardiac Rehab from 02/12/2021 in [facility] Cardiac and Pulmonary Rehab  Date 02/12/21  Educator [facility]  Instruction Review Code 1- Verbalizes Understanding       Know Your Numbers and Heart Failure: - Group verbal and visual instruction to discuss disease risk factors for cardiac and pulmonary disease and treatment options.  Reviews associated critical values for Overweight/Obesity, Hypertension, Cholesterol, and Diabetes.  Discusses basics of heart failure: signs/symptoms and treatments.  Introduces Heart Failure Zone chart for action plan for heart failure.  Written material given at graduation.   Infection Prevention: - Provides verbal and written material to individual with discussion of infection control including proper hand washing and proper equipment cleaning during exercise session. Flowsheet Row Cardiac Rehab from 02/12/2021 in [facility] Cardiac and Pulmonary Rehab  Date 01/15/21  Educator [facility]  Instruction Review Code 1- Verbalizes Understanding       Falls Prevention: - Provides verbal and written material to individual with discussion of falls prevention and safety. Flowsheet Row Cardiac Rehab from 02/12/2021 in [facility] Cardiac and Pulmonary Rehab  Date 01/15/21  Educator [facility]  Instruction Review Code 1- Verbalizes Understanding        Other: -Provides group and verbal instruction on various topics (see comments)   Knowledge Questionnaire Score:  Knowledge Questionnaire Score - 01/15/21 1146       Knowledge Questionnaire Score   Pre Score 24/26 angina, exercise             Core Components/Risk Factors/Patient Goals at Admission:  Personal Goals and Risk Factors at Admission - 01/15/21 1147       Core Components/Risk Factors/Patient Goals on Admission    Weight Management Yes;Weight Loss;Obesity    Intervention Weight Management: Develop a combined nutrition and exercise program designed to reach desired caloric intake, while maintaining appropriate intake of nutrient and fiber, sodium and fats, and appropriate energy expenditure required for the weight goal.;Weight Management: Provide education and appropriate resources to help participant work on and attain dietary goals.;Weight Management/Obesity: Establish reasonable short term and long term weight goals.;Obesity: Provide education and appropriate resources to help participant work on and attain dietary goals.    Admit Weight 244 lb 12.8 oz (111 kg)    Goal Weight: Short Term 240 lb (108.9 kg)    Goal Weight: Long Term 230 lb (104.3 kg)    Expected Outcomes Short Term: Continue to assess and modify interventions until short term weight is achieved;Long Term: Adherence to nutrition and physical activity/exercise program aimed toward attainment of established weight goal;Weight Loss: Understanding of general recommendations for a balanced deficit meal plan, which promotes 1-2 lb weight loss per week and includes a negative energy balance of [phone] kcal/d;Understanding  of distribution of calorie intake throughout the day with the consumption of 4-5 meals/snacks;Understanding recommendations for meals to include 15-35% energy as protein, 25-35% energy from fat, 35-60% energy from carbohydrates, less than $RemoveB'200mg'wzlkuKTy$  of dietary cholesterol, 20-35 gm of total fiber daily     Diabetes Yes    Intervention Provide education about signs/symptoms and action to take for hypo/hyperglycemia.;Provide education about proper nutrition, including hydration, and aerobic/resistive exercise prescription along with prescribed medications to achieve blood glucose in normal ranges: Fasting glucose 65-99 mg/dL    Expected Outcomes Short Term: Participant verbalizes understanding of the signs/symptoms and immediate care of hyper/hypoglycemia, proper foot care and importance of medication, aerobic/resistive exercise and nutrition plan for blood glucose control.;Long Term: Attainment of HbA1C < 7%.    Heart Failure Yes    Intervention Provide a combined exercise and nutrition program that is supplemented with education, support and counseling about heart failure. Directed toward relieving symptoms such as shortness of breath, decreased exercise tolerance, and extremity edema.    Expected Outcomes Improve functional capacity of life;Short term: Attendance in program 2-3 days a week with increased exercise capacity. Reported lower sodium intake. Reported increased fruit and vegetable intake. Reports medication compliance.;Short term: Daily weights obtained and reported for increase. Utilizing diuretic protocols set by physician.;Long term: Adoption of self-care skills and reduction of barriers for early signs and symptoms recognition and intervention leading to self-care maintenance.    Hypertension Yes    Intervention Provide education on lifestyle modifcations including regular physical activity/exercise, weight management, moderate sodium restriction and increased consumption of fresh fruit, vegetables, and low fat dairy, alcohol moderation, and smoking cessation.;Monitor prescription use compliance.    Expected Outcomes Short Term: Continued assessment and intervention until BP is < 140/35mm HG in hypertensive participants. < 130/18mm HG in hypertensive participants with diabetes, heart failure  or chronic kidney disease.;Long Term: Maintenance of blood pressure at goal levels.    Lipids Yes    Intervention Provide education and support for participant on nutrition & aerobic/resistive exercise along with prescribed medications to achieve LDL '70mg'$ , HDL >$Remo'40mg'XQgui$ .    Expected Outcomes Long Term: Cholesterol controlled with medications as prescribed, with individualized exercise RX and with personalized nutrition plan. Value goals: LDL < $Rem'70mg'pfwM$ , HDL > 40 mg.;Short Term: Participant states understanding of desired cholesterol values and is compliant with medications prescribed. Participant is following exercise prescription and nutrition guidelines.             Education:Diabetes - Individual verbal and written instruction to review signs/symptoms of diabetes, desired ranges of glucose level fasting, after meals and with exercise. Acknowledge that pre and post exercise glucose checks will be done for 3 sessions at entry of program. [place] from 02/12/2021 in [facility] Cardiac and Pulmonary Rehab  Date 01/15/21  Educator [facility]  Instruction Review Code 1- Verbalizes Understanding       Core Components/Risk Factors/Patient Goals Review:    Core Components/Risk Factors/Patient Goals at Discharge (Final Review):    ITP Comments:  ITP Comments     Row Name 01/08/21 1425 01/15/21 1140 01/27/21 1000 01/29/21 0725 02/26/21 0626   ITP Comments Completed virtual orientation today.  EP evaluation is scheduled for Wednesday 01/15/21 at 930am.  Documentation for diagnosis can be found in [facility] encounter 12/20/20. Completed 6MWT and gym orientation. Initial ITP created and sent for review to Dr. Emily Filbert, Medical Director. [place] did not complete his rehab session.  Blood sugar is $Remove'345mg'dBHATlA$ /DL He has asked his provider  for a  meter like the Freestyle.   He was advised to call his provider and let them know he does not have a working meter and his blood sugar was elevated this AM.   He normally has been arrive with blood sugar in 200 range. 30 Day review completed. Medical Director ITP review done, changes made as directed, and signed approval by Medical Director. 30 Day review completed. Medical Director ITP review done, changes made as directed, and signed approval by Medical Director.            Comments:

## 2021-02-27 ENCOUNTER — Telehealth: Payer: Self-pay

## 2021-02-27 NOTE — Telephone Encounter (Signed)
LMOM

## 2021-03-03 ENCOUNTER — Other Ambulatory Visit: Payer: Self-pay

## 2021-03-03 ENCOUNTER — Encounter: Payer: Medicare HMO | Admitting: *Deleted

## 2021-03-03 DIAGNOSIS — Z955 Presence of coronary angioplasty implant and graft: Secondary | ICD-10-CM

## 2021-03-03 DIAGNOSIS — I214 Non-ST elevation (NSTEMI) myocardial infarction: Secondary | ICD-10-CM

## 2021-03-03 NOTE — Progress Notes (Signed)
Daily Session Note  Patient Details  Name: Aaron [name]. MRN: [id number] Date of Birth: Aug 08, 1945 Referring Provider:   Flowsheet Row Cardiac Rehab from 01/15/2021 in [facility] Cardiac and Pulmonary Rehab  Referring Provider Serafina Royals MD       Encounter Date: 03/03/2021  Check In:      Social History   Tobacco Use  Smoking Status Former  Smokeless Tobacco Former  Tobacco Comments   7/20 quit 35 years ago    Goals Met:  Independence with exercise equipment Exercise tolerated well No report of concerns or symptoms today  Goals Unmet:  Not Applicable  Comments: Pt able to follow exercise prescription today without complaint.  Will continue to monitor for progression.    Dr. Emily Filbert is Medical Director for [place].  Dr. Ottie Glazier is Medical Director for [facility] Pulmonary Rehabilitation.

## 2021-03-04 DIAGNOSIS — N183 Chronic kidney disease, stage 3 unspecified: Secondary | ICD-10-CM | POA: Diagnosis not present

## 2021-03-04 DIAGNOSIS — I2510 Atherosclerotic heart disease of native coronary artery without angina pectoris: Secondary | ICD-10-CM | POA: Diagnosis not present

## 2021-03-04 DIAGNOSIS — M109 Gout, unspecified: Secondary | ICD-10-CM | POA: Diagnosis not present

## 2021-03-04 DIAGNOSIS — M545 Low back pain, unspecified: Secondary | ICD-10-CM | POA: Diagnosis not present

## 2021-03-04 DIAGNOSIS — E1129 Type 2 diabetes mellitus with other diabetic kidney complication: Secondary | ICD-10-CM | POA: Diagnosis not present

## 2021-03-04 DIAGNOSIS — Z6832 Body mass index (BMI) 32.0-32.9, adult: Secondary | ICD-10-CM | POA: Diagnosis not present

## 2021-03-04 DIAGNOSIS — I10 Essential (primary) hypertension: Secondary | ICD-10-CM | POA: Diagnosis not present

## 2021-03-05 ENCOUNTER — Other Ambulatory Visit: Payer: Self-pay

## 2021-03-05 DIAGNOSIS — Z955 Presence of coronary angioplasty implant and graft: Secondary | ICD-10-CM

## 2021-03-05 DIAGNOSIS — I214 Non-ST elevation (NSTEMI) myocardial infarction: Secondary | ICD-10-CM

## 2021-03-05 NOTE — Progress Notes (Signed)
Daily Session Note  Patient Details  Name: Aaron [name]. MRN: [id number] Date of Birth: [birth year] Referring Provider:   Flowsheet Row Cardiac Rehab from 01/15/2021 in [facility] Cardiac and Pulmonary Rehab  Referring Provider Serafina Royals MD       Encounter Date: 03/05/2021  Check In:  Session Check In - 03/05/21 0954       Check-In   Supervising physician immediately available to respond to emergencies See telemetry face sheet for immediately available ER MD    Location [facility]-Cardiac & Pulmonary Rehab    Staff Present Birdie Sons, MPA, Elveria Rising, BA, ACSM CEP, Exercise Physiologist;Joseph Tessie Fass, [place]    Virtual Visit No    Medication changes reported     No    Fall or balance concerns reported    No    Warm-up and Cool-down Performed on first and last piece of equipment    Resistance Training Performed Yes    VAD Patient? No    PAD/SET Patient? No      Pain Assessment   Currently in Pain? No/denies                Social History   Tobacco Use  Smoking Status Former  Smokeless Tobacco Former  Tobacco Comments   7/20 quit 35 years ago    Goals Met:  Independence with exercise equipment Exercise tolerated well No report of concerns or symptoms today Strength training completed today  Goals Unmet:  Not Applicable  Comments: Pt able to follow exercise prescription today without complaint.  Will continue to monitor for progression.    Dr. Emily Filbert is Medical Director for [place].  Dr. Ottie Glazier is Medical Director for [facility] Pulmonary Rehabilitation.

## 2021-03-07 ENCOUNTER — Encounter: Payer: Medicare HMO | Admitting: *Deleted

## 2021-03-07 ENCOUNTER — Other Ambulatory Visit: Payer: Self-pay

## 2021-03-07 DIAGNOSIS — I214 Non-ST elevation (NSTEMI) myocardial infarction: Secondary | ICD-10-CM

## 2021-03-07 DIAGNOSIS — Z955 Presence of coronary angioplasty implant and graft: Secondary | ICD-10-CM | POA: Diagnosis not present

## 2021-03-07 NOTE — Progress Notes (Signed)
Daily Session Note  Patient Details  Name: Riely Baskett. MRN: [id number] Date of Birth: 05-16-1946 Referring Provider:   Flowsheet Row Cardiac Rehab from 01/15/2021 in [facility] Cardiac and Pulmonary Rehab  Referring Provider Serafina Royals MD       Encounter Date: 03/07/2021  Check In:  Session Check In - 03/07/21 0959       Check-In   Supervising physician immediately available to respond to emergencies See telemetry face sheet for immediately available ER MD    Location [facility]-Cardiac & Pulmonary Rehab    Staff Present Renita Papa, RN BSN;Joseph [place], RCP,RRT,BSRT;Jessica [place], [place], RCEP, CCRP, CCET    Virtual Visit No    Medication changes reported     No    Fall or balance concerns reported    No    Warm-up and Cool-down Performed on first and last piece of equipment    Resistance Training Performed Yes    VAD Patient? No    PAD/SET Patient? No      Pain Assessment   Currently in Pain? No/denies                Social History   Tobacco Use  Smoking Status Former  Smokeless Tobacco Former  Tobacco Comments   7/20 quit 35 years ago    Goals Met:  Independence with exercise equipment Exercise tolerated well No report of concerns or symptoms today Strength training completed today  Goals Unmet:  Not Applicable  Comments: Pt able to follow exercise prescription today without complaint.  Will continue to monitor for progression.  Reviewed home exercise with pt today.  Pt plans to walk and use staff videos at home for exercise.  Reviewed THR, pulse, RPE, sign and symptoms, pulse oximetery and when to call 911 or MD.  Also discussed weather considerations and indoor options.  Pt voiced understanding.   Dr. Emily Filbert is Medical Director for [place].  Dr. Ottie Glazier is Medical Director for [facility] Pulmonary Rehabilitation.

## 2021-03-10 ENCOUNTER — Other Ambulatory Visit: Payer: Self-pay

## 2021-03-10 ENCOUNTER — Encounter: Payer: Medicare HMO | Admitting: *Deleted

## 2021-03-10 DIAGNOSIS — Z955 Presence of coronary angioplasty implant and graft: Secondary | ICD-10-CM

## 2021-03-10 DIAGNOSIS — I214 Non-ST elevation (NSTEMI) myocardial infarction: Secondary | ICD-10-CM | POA: Diagnosis not present

## 2021-03-10 NOTE — Progress Notes (Signed)
Daily Session Note  Patient Details  Name: Aaron [name]. MRN: [id number] Date of Birth: 07/04/1945 Referring Provider:   Flowsheet Row Cardiac Rehab from 01/15/2021 in [facility] Cardiac and Pulmonary Rehab  Referring Provider Serafina Royals MD       Encounter Date: 03/10/2021  Check In:  Session Check In - 03/10/21 0959       Check-In   Supervising physician immediately available to respond to emergencies See telemetry face sheet for immediately available ER MD    Staff Present Renita Papa, RN BSN;Joseph [place], RCP,RRT,BSRT;Kelly [place], BS, ACSM CEP, Exercise Physiologist    Virtual Visit No    Medication changes reported     No    Warm-up and Cool-down Performed on first and last piece of equipment    Resistance Training Performed Yes    VAD Patient? No    PAD/SET Patient? No      Pain Assessment   Currently in Pain? No/denies                Social History   Tobacco Use  Smoking Status Former  Smokeless Tobacco Former  Tobacco Comments   7/20 quit 35 years ago    Goals Met:  Independence with exercise equipment Exercise tolerated well No report of concerns or symptoms today Strength training completed today  Goals Unmet:  Not Applicable  Comments: Pt able to follow exercise prescription today without complaint.  Will continue to monitor for progression.    Dr. Emily Filbert is Medical Director for [place].  Dr. Ottie Glazier is Medical Director for [facility] Pulmonary Rehabilitation.

## 2021-03-12 ENCOUNTER — Other Ambulatory Visit: Payer: Self-pay

## 2021-03-12 DIAGNOSIS — Z955 Presence of coronary angioplasty implant and graft: Secondary | ICD-10-CM | POA: Diagnosis not present

## 2021-03-12 DIAGNOSIS — I214 Non-ST elevation (NSTEMI) myocardial infarction: Secondary | ICD-10-CM

## 2021-03-12 NOTE — Progress Notes (Signed)
Daily Session Note  Patient Details  Name: Aaron [name]. MRN: [id number] Date of Birth: 26-Dec-1945 Referring Provider:   Flowsheet Row Cardiac Rehab from 01/15/2021 in [facility] Cardiac and Pulmonary Rehab  Referring Provider Serafina Royals MD       Encounter Date: 03/12/2021  Check In:  Session Check In - 03/12/21 0959       Check-In   Supervising physician immediately available to respond to emergencies See telemetry face sheet for immediately available ER MD    Location [facility]-Cardiac & Pulmonary Rehab    Staff Present Birdie Sons, MPA, Elveria Rising, BA, ACSM CEP, Exercise Physiologist;Joseph Tessie Fass, [place]    Virtual Visit No    Medication changes reported     No    Fall or balance concerns reported    No    Warm-up and Cool-down Performed on first and last piece of equipment    Resistance Training Performed Yes    VAD Patient? No    PAD/SET Patient? No      Pain Assessment   Currently in Pain? No/denies                Social History   Tobacco Use  Smoking Status Former  Smokeless Tobacco Former  Tobacco Comments   7/20 quit 35 years ago    Goals Met:  Independence with exercise equipment Exercise tolerated well No report of concerns or symptoms today Strength training completed today  Goals Unmet:  Not Applicable  Comments: Pt able to follow exercise prescription today without complaint.  Will continue to monitor for progression.    Dr. Emily Filbert is Medical Director for [place].  Dr. Ottie Glazier is Medical Director for [facility] Pulmonary Rehabilitation.

## 2021-03-14 ENCOUNTER — Other Ambulatory Visit: Payer: Self-pay

## 2021-03-14 DIAGNOSIS — Z955 Presence of coronary angioplasty implant and graft: Secondary | ICD-10-CM

## 2021-03-14 DIAGNOSIS — I214 Non-ST elevation (NSTEMI) myocardial infarction: Secondary | ICD-10-CM

## 2021-03-14 NOTE — Progress Notes (Signed)
Daily Session Note  Patient Details  Name: Aaron [name]. MRN: [id number] Date of Birth: 1945-07-09 Referring Provider:   Flowsheet Row Cardiac Rehab from 01/15/2021 in [facility] Cardiac and Pulmonary Rehab  Referring Provider Serafina Royals MD       Encounter Date: 03/14/2021  Check In:  Session Check In - 03/14/21 1029       Check-In   Supervising physician immediately available to respond to emergencies See telemetry face sheet for immediately available ER MD    Location [facility]-Cardiac & Pulmonary Rehab    Staff Present Hope Budds, RDN, LDN;Jessica Luan [place], MA, RCEP, CCRP, CCET;Aralynn Brake, RN, BSN    Virtual Visit No    Medication changes reported     No    Fall or balance concerns reported    No    Warm-up and Cool-down Performed on first and last piece of equipment    Resistance Training Performed Yes    VAD Patient? No    PAD/SET Patient? No      Pain Assessment   Currently in Pain? No/denies                Social History   Tobacco Use  Smoking Status Former  Smokeless Tobacco Former  Tobacco Comments   7/20 quit 35 years ago    Goals Met:  Independence with exercise equipment Exercise tolerated well No report of concerns or symptoms today Strength training completed today  Goals Unmet:  Not Applicable  Comments: Pt able to follow exercise prescription today without complaint.  Will continue to monitor for progression.   Dr. Emily Filbert is Medical Director for [place].  Dr. Ottie Glazier is Medical Director for [facility] Pulmonary Rehabilitation.

## 2021-03-17 ENCOUNTER — Encounter: Payer: Medicare HMO | Admitting: *Deleted

## 2021-03-17 ENCOUNTER — Other Ambulatory Visit: Payer: Self-pay

## 2021-03-17 DIAGNOSIS — Z955 Presence of coronary angioplasty implant and graft: Secondary | ICD-10-CM

## 2021-03-17 DIAGNOSIS — I214 Non-ST elevation (NSTEMI) myocardial infarction: Secondary | ICD-10-CM

## 2021-03-17 NOTE — Progress Notes (Signed)
Daily Session Note  Patient Details  Name: Sidhant Helderman. MRN: [id number] Date of Birth: [birth year] Referring Provider:   Flowsheet Row Cardiac Rehab from 01/15/2021 in [facility] Cardiac and Pulmonary Rehab  Referring Provider Serafina Royals MD       Encounter Date: 03/17/2021  Check In:  Session Check In - 03/17/21 1124       Check-In   Supervising physician immediately available to respond to emergencies See telemetry face sheet for immediately available ER MD    Location [facility]-Cardiac & Pulmonary Rehab    Staff Present Nyoka Cowden, RN, BSN, Jennye Moc[place], MPA, Mauricia Area, BS, ACSM CEP, Exercise Physiologist    Virtual Visit No    Medication changes reported     No    Fall or balance concerns reported    No    Tobacco Cessation No Change    Warm-up and Cool-down Performed on first and last piece of equipment    Resistance Training Performed Yes    VAD Patient? No      Pain Assessment   Currently in Pain? No/denies                Social History   Tobacco Use  Smoking Status Former  Smokeless Tobacco Former  Tobacco Comments   7/20 quit 35 years ago    Goals Met:  Independence with exercise equipment Exercise tolerated well No report of concerns or symptoms today  Goals Unmet:  Not Applicable  Comments. Emily Filbert is Market researcher for [place].  Dr. Ottie Glazier is Medical Director for [facility] Pulmonary Rehabilitation.

## 2021-03-19 ENCOUNTER — Other Ambulatory Visit: Payer: Self-pay

## 2021-03-19 ENCOUNTER — Encounter: Payer: Medicare HMO | Admitting: *Deleted

## 2021-03-19 DIAGNOSIS — I214 Non-ST elevation (NSTEMI) myocardial infarction: Secondary | ICD-10-CM | POA: Diagnosis not present

## 2021-03-19 DIAGNOSIS — Z955 Presence of coronary angioplasty implant and graft: Secondary | ICD-10-CM

## 2021-03-19 NOTE — Progress Notes (Signed)
Daily Session Note  Patient Details  Name: Aaron [name]. MRN: [id number] Date of Birth: September 01, 1945 Referring Provider:   Flowsheet Row Cardiac Rehab from 01/15/2021 in [facility] Cardiac and Pulmonary Rehab  Referring Provider Serafina Royals MD       Encounter Date: 03/19/2021  Check In:  Session Check In - 03/19/21 1145       Check-In   Supervising physician immediately available to respond to emergencies See telemetry face sheet for immediately available ER MD    Location [facility]-Cardiac & Pulmonary Rehab    Staff Present Nyoka Cowden, RN, BSN, Kela Millin, BA, ACSM CEP, Exercise Physiologist;Krista Frederico [place], RN BSN    Virtual Visit No    Medication changes reported     No    Fall or balance concerns reported    No    Tobacco Cessation No Change    Warm-up and Cool-down Performed on first and last piece of equipment    Resistance Training Performed Yes    VAD Patient? No    PAD/SET Patient? No      Pain Assessment   Currently in Pain? No/denies                Social History   Tobacco Use  Smoking Status Former  Smokeless Tobacco Former  Tobacco Comments   7/20 quit 35 years ago    Goals Met:  Independence with exercise equipment Exercise tolerated well No report of concerns or symptoms today  Goals Unmet:  Not Applicable  Comments: Pt able to follow exercise prescription today without complaint.  Will continue to monitor for progression.    Dr. Emily Filbert is Medical Director for [place].  Dr. Ottie Glazier is Medical Director for [facility] Pulmonary Rehabilitation.

## 2021-03-21 ENCOUNTER — Other Ambulatory Visit: Payer: Self-pay

## 2021-03-21 ENCOUNTER — Encounter: Payer: Medicare HMO | Admitting: *Deleted

## 2021-03-21 DIAGNOSIS — Z955 Presence of coronary angioplasty implant and graft: Secondary | ICD-10-CM

## 2021-03-21 DIAGNOSIS — I214 Non-ST elevation (NSTEMI) myocardial infarction: Secondary | ICD-10-CM | POA: Diagnosis not present

## 2021-03-21 NOTE — Progress Notes (Signed)
Daily Session Note  Patient Details  Name: Aaron [name]. MRN: [id number] Date of Birth: 08/02/1945 Referring Provider:   Flowsheet Row Cardiac Rehab from 01/15/2021 in [facility] Cardiac and Pulmonary Rehab  Referring Provider Serafina Royals MD       Encounter Date: 03/21/2021  Check In:  Session Check In - 03/21/21 0956       Check-In   Supervising physician immediately available to respond to emergencies See telemetry face sheet for immediately available ER MD    Location [facility]-Cardiac & Pulmonary Rehab    Staff Present Nyoka Cowden, RN, BSN, [place]lette Pa, MA, RCEP, CCRP, CCET;Joseph [place], [place]    Virtual Visit No    Medication changes reported     No    Fall or balance concerns reported    No    Tobacco Cessation No Change    Warm-up and Cool-down Performed on first and last piece of equipment    Resistance Training Performed Yes    VAD Patient? No    PAD/SET Patient? No      Pain Assessment   Currently in Pain? No/denies                Social History   Tobacco Use  Smoking Status Former  Smokeless Tobacco Former  Tobacco Comments   7/20 quit 35 years ago    Goals Met:  Independence with exercise equipment Exercise tolerated well No report of concerns or symptoms today  Goals Unmet:  Not Applicable  Comments:Pt able to follow exercise prescription today without complaint.  Will continue to monitor for progression.   Dr. Emily Filbert is Medical Director for [place].  Dr. Ottie Glazier is Medical Director for [facility] Pulmonary Rehabilitation.

## 2021-03-24 ENCOUNTER — Encounter: Payer: Medicare HMO | Attending: Internal Medicine

## 2021-03-24 ENCOUNTER — Other Ambulatory Visit: Payer: Self-pay

## 2021-03-24 DIAGNOSIS — I214 Non-ST elevation (NSTEMI) myocardial infarction: Secondary | ICD-10-CM

## 2021-03-24 DIAGNOSIS — Z48812 Encounter for surgical aftercare following surgery on the circulatory system: Secondary | ICD-10-CM | POA: Insufficient documentation

## 2021-03-24 DIAGNOSIS — I252 Old myocardial infarction: Secondary | ICD-10-CM | POA: Diagnosis present

## 2021-03-24 DIAGNOSIS — Z955 Presence of coronary angioplasty implant and graft: Secondary | ICD-10-CM | POA: Diagnosis present

## 2021-03-24 NOTE — Progress Notes (Signed)
Daily Session Note  Patient Details  Name: Aaron [name]. MRN: [id number] Date of Birth: Mar 13, 1946 Referring Provider:   Flowsheet Row Cardiac Rehab from 01/15/2021 in [facility] Cardiac and Pulmonary Rehab  Referring Provider Serafina Royals MD       Encounter Date: 03/24/2021  Check In:  Session Check In - 03/24/21 0950       Check-In   Supervising physician immediately available to respond to emergencies See telemetry face sheet for immediately available ER MD    Location [facility]-Cardiac & Pulmonary Rehab    Staff Present Birdie Sons, MPA, RN;Amanda Oletta Darter, BA, ACSM CEP, Exercise Physiologist;Doyce Saling Amedeo Plenty, BS, ACSM CEP, Exercise Physiologist;Kara Eliezer Bottom, MS, ASCM CEP, Exercise Physiologist    Virtual Visit No    Medication changes reported     No    Fall or balance concerns reported    No    Tobacco Cessation No Change    Warm-up and Cool-down Performed on first and last piece of equipment    Resistance Training Performed Yes    VAD Patient? No    PAD/SET Patient? No      Pain Assessment   Currently in Pain? No/denies                Social History   Tobacco Use  Smoking Status Former  Smokeless Tobacco Former  Tobacco Comments   7/20 quit 35 years ago    Goals Met:  Independence with exercise equipment Exercise tolerated well No report of concerns or symptoms today Strength training completed today  Goals Unmet:  Not Applicable  Comments: Pt able to follow exercise prescription today without complaint.  Will continue to monitor for progression.    Dr. Emily Filbert is Medical Director for [place].  Dr. Ottie Glazier is Medical Director for [facility] Pulmonary Rehabilitation.

## 2021-03-26 ENCOUNTER — Other Ambulatory Visit: Payer: Self-pay

## 2021-03-26 ENCOUNTER — Encounter: Payer: Self-pay | Admitting: *Deleted

## 2021-03-26 DIAGNOSIS — Z955 Presence of coronary angioplasty implant and graft: Secondary | ICD-10-CM

## 2021-03-26 DIAGNOSIS — I214 Non-ST elevation (NSTEMI) myocardial infarction: Secondary | ICD-10-CM

## 2021-03-26 DIAGNOSIS — Z48812 Encounter for surgical aftercare following surgery on the circulatory system: Secondary | ICD-10-CM | POA: Diagnosis not present

## 2021-03-26 DIAGNOSIS — I252 Old myocardial infarction: Secondary | ICD-10-CM | POA: Diagnosis not present

## 2021-03-26 NOTE — Progress Notes (Signed)
Daily Session Note  Patient Details  Name: Aaron [name]. MRN: [id number] Date of Birth: 1945/11/16 Referring Provider:   Flowsheet Row Cardiac Rehab from 01/15/2021 in [facility] Cardiac and Pulmonary Rehab  Referring Provider Serafina Royals MD       Encounter Date: 03/26/2021  Check In:  Session Check In - 03/26/21 0929       Check-In   Supervising physician immediately available to respond to emergencies See telemetry face sheet for immediately available ER MD    Location [facility]-Cardiac & Pulmonary Rehab    Staff Present Birdie Sons, MPA, RN;Melissa Wortham, RDN, Rowe Pavy, BA, ACSM CEP, Exercise Physiologist    Virtual Visit No    Medication changes reported     No    Fall or balance concerns reported    No    Tobacco Cessation No Change    Warm-up and Cool-down Performed on first and last piece of equipment    Resistance Training Performed Yes    VAD Patient? No    PAD/SET Patient? No      Pain Assessment   Currently in Pain? No/denies                Social History   Tobacco Use  Smoking Status Former  Smokeless Tobacco Former  Tobacco Comments   7/20 quit 35 years ago    Goals Met:  Independence with exercise equipment Exercise tolerated well No report of concerns or symptoms today Strength training completed today  Goals Unmet:  Not Applicable  Comments: Pt able to follow exercise prescription today without complaint.  Will continue to monitor for progression.    Dr. Emily Filbert is Medical Director for [place].  Dr. Ottie Glazier is Medical Director for [facility] Pulmonary Rehabilitation.

## 2021-03-26 NOTE — Progress Notes (Signed)
Cardiac Individual Treatment Plan  Patient Details  Name: Aaron [name]. MRN: [id number] Date of Birth: Apr 24, 1946 Referring Provider:   Flowsheet Row Cardiac Rehab from 01/15/2021 in [facility] Cardiac and Pulmonary Rehab  Referring Provider Serafina Royals MD       Initial Encounter Date:  Flowsheet Row Cardiac Rehab from 01/15/2021 in [facility] Cardiac and Pulmonary Rehab  Date 01/15/21       Visit Diagnosis: NSTEMI (non-ST elevated myocardial infarction) [facility])  Status post coronary artery stent placement  Patient's Home Medications on Admission:  Current Outpatient Medications:    amLODipine (NORVASC) 5 MG tablet, Take by mouth., Disp: , Rfl:    aspirin 81 MG chewable tablet, Chew 1 tablet (81 mg total) by mouth daily., Disp:  , Rfl:    carvedilol (COREG) 3.125 MG tablet, Take 1 tablet (3.125 mg total) by mouth 2 (two) times daily with a meal., Disp: 60 tablet, Rfl: 0   cholecalciferol (VITAMIN D3) 25 MCG (1000 UNIT) tablet, Take 1,000 Units by mouth daily., Disp: , Rfl:    Coenzyme Q10 100 MG capsule, Take by mouth., Disp: , Rfl:    ezetimibe (ZETIA) 10 MG tablet, Take 1 tablet (10 mg total) by mouth daily., Disp: 30 tablet, Rfl: 0   ferrous sulfate 325 (65 FE) MG tablet, Take by mouth., Disp: , Rfl:    glipiZIDE (GLUCOTROL XL) 10 MG 24 hr tablet, , Disp: , Rfl:    glucosamine-chondroitin 500-400 MG tablet, Take 1 tablet by mouth 3 (three) times daily., Disp: , Rfl:    ketoconazole (NIZORAL) 2 % shampoo, Apply 1 application topically 2 (two) times a week., Disp: , Rfl: 3   MAGNESIUM OXIDE PO, Take by mouth., Disp: , Rfl:    MANGANESE PO, Take by mouth., Disp: , Rfl:    Multiple Vitamin (MULTIVITAMIN PO), Take by mouth., Disp: , Rfl:    multivitamin-lutein (OCUVITE-LUTEIN) CAPS capsule, Take 1 capsule by mouth daily., Disp: , Rfl:    nitroGLYCERIN (NITROSTAT) 0.4 MG SL tablet, Take 0.4 mg by mouth every 5 (five) minutes as needed for chest pain. , Disp: , Rfl:    Omega-3 Fatty  Acids (FISH OIL) 1200 MG CAPS, Take 2,400 mg by mouth daily. , Disp: , Rfl:    pantoprazole (PROTONIX) 40 MG tablet, Take 1 tablet (40 mg total) by mouth 2 (two) times daily before a meal., Disp: 60 tablet, Rfl: 0   phytonadione (VITAMIN K) 2 MG/ML SOLN oral solution, Take by mouth., Disp: , Rfl:    PRALUENT 150 MG/ML SOAJ, SMARTSIG:150 Milligram(s) SUB-Q Every 2 Weeks, Disp: , Rfl:    ticagrelor (BRILINTA) 90 MG TABS tablet, Take 1 tablet (90 mg total) by mouth 2 (two) times daily., Disp: 60 tablet, Rfl: 0   traMADol (ULTRAM) 50 MG tablet, Take 50 mg by mouth every 6 (six) hours as needed for moderate pain., Disp: , Rfl:    vitamin B-12 (CYANOCOBALAMIN) 1000 MCG tablet, Take 1,000 mcg by mouth daily., Disp: , Rfl:   Past Medical History: Past Medical History:  Diagnosis Date   Cellulitis    Diabetes mellitus without complication (HCC)    MI, old     Tobacco Use: Social History   Tobacco Use  Smoking Status Former  Smokeless Tobacco Former  Tobacco Comments   7/20 quit 35 years ago    Labs: Recent Review Flowsheet Data     Labs for ITP Cardiac and Pulmonary Rehab Latest Ref Rng & Units 09/13/2014 06/12/2019 06/13/2019 12/20/2020 12/21/2020  Cholestrol 0 - 200 mg/dL - - 148 - 136   LDLCALC 0 - 99 mg/dL - - UNABLE TO CALCULATE IF TRIGLYCERIDE OVER 400 mg/dL - UNABLE TO CALCULATE IF TRIGLYCERIDE OVER 400 mg/dL   LDLDIRECT 0 - 99 mg/dL - - 65.4 - 49.6   HDL >40 mg/dL - - 25(L) - 24(L)   Trlycerides <150 mg/dL - - 505(H) - 529(H)   Hemoglobin A1c 4.8 - 5.6 % 6.1(H) 10.6(H) 9.6(H) 8.1(H) -        Exercise Target Goals: Exercise Program Goal: Individual exercise prescription set using results from initial 6 min walk test and THRR while considering  patient's activity barriers and safety.   Exercise Prescription Goal: Initial exercise prescription builds to 30-45 minutes a day of aerobic activity, 2-3 days per week.  Home exercise guidelines will be given to patient during program  as part of exercise prescription that the participant will acknowledge.   Education: Aerobic Exercise: - Group verbal and visual presentation on the components of exercise prescription. Introduces F.I.T.T principle from ACSM for exercise prescriptions.  Reviews F.I.T.T. principles of aerobic exercise including progression. Written material given at graduation. Flowsheet Row Cardiac Rehab from 03/12/2021 in [facility] Cardiac and Pulmonary Rehab  Date 02/12/21  Educator [facility]  Instruction Review Code 1- Verbalizes Understanding       Education: Resistance Exercise: - Group verbal and visual presentation on the components of exercise prescription. Introduces F.I.T.T principle from ACSM for exercise prescriptions  Reviews F.I.T.T. principles of resistance exercise including progression. Written material given at graduation.    Education: Exercise & Equipment Safety: - Individual verbal instruction and demonstration of equipment use and safety with use of the equipment. Flowsheet Row Cardiac Rehab from 03/12/2021 in [facility] Cardiac and Pulmonary Rehab  Date 01/15/21  Educator [facility]  Instruction Review Code 1- Verbalizes Understanding       Education: Exercise Physiology & General Exercise Guidelines: - Group verbal and written instruction with models to review the exercise physiology of the cardiovascular system and associated critical values. Provides general exercise guidelines with specific guidelines to those with heart or lung disease.  Flowsheet Row Cardiac Rehab from 03/12/2021 in [facility] Cardiac and Pulmonary Rehab  Date 02/05/21  Educator [facility]  Instruction Review Code 1- Verbalizes Understanding       Education: Flexibility, Balance, Mind/Body Relaxation: - Group verbal and visual presentation with interactive activity on the components of exercise prescription. Introduces F.I.T.T principle from ACSM for exercise prescriptions. Reviews F.I.T.T. principles of flexibility and balance exercise  training including progression. Also discusses the mind body connection.  Reviews various relaxation techniques to help reduce and manage stress (i.e. Deep breathing, progressive muscle relaxation, and visualization). Balance handout provided to take home. Written material given at graduation.   Activity Barriers & Risk Stratification:  Activity Barriers & Cardiac Risk Stratification - 01/15/21 1142       Activity Barriers & Cardiac Risk Stratification   Activity Barriers Deconditioning;Muscular Weakness;History of Falls;Joint Problems;Other (comment);Left Knee Replacement;Shortness of Breath;Back Problems    Comments bilateral shoulder surgeries, hips hurt with walking    Cardiac Risk Stratification High             6 Minute Walk:  6 Minute Walk     Row Name 01/15/21 1141         6 Minute Walk   Phase Initial     Distance 895 feet     Walk Time 6 minutes     # of Rest Breaks 0  MPH 1.69     METS 2.34     RPE 13     Perceived Dyspnea  1     VO2 Peak 8.18     Symptoms Yes (comment)     Comments hip pain 3/10, chest pain 2/10, SOB     Resting HR 82 bpm     Resting BP 124/66     Resting Oxygen Saturation  98 %     Exercise Oxygen Saturation  during 6 min walk 96 %     Max Ex. HR 122 bpm     Max Ex. BP 196/74     2 Minute Post BP 148/70              Oxygen Initial Assessment:   Oxygen Re-Evaluation:   Oxygen Discharge (Final Oxygen Re-Evaluation):   Initial Exercise Prescription:  Initial Exercise Prescription - 01/15/21 1100       Date of Initial Exercise RX and Referring Provider   Date 01/15/21    Referring Provider Serafina Royals MD      Treadmill   MPH 1.5    Grade 0.5    Minutes 15    METs 2.25      Recumbant Elliptical   Level 1    RPM 50    Minutes 15    METs 2      REL-XR   Level 1    Speed 50    Minutes 15    METs 2      Prescription Details   Frequency (times per week) 3    Duration Progress to 30 minutes of continuous  aerobic without signs/symptoms of physical distress      Intensity   THRR 40-80% of Max Heartrate 107-132    Ratings of Perceived Exertion 11-13    Perceived Dyspnea 0-4      Progression   Progression Continue to progress workloads to maintain intensity without signs/symptoms of physical distress.      Resistance Training   Training Prescription Yes    Weight 4 lb    Reps 10-15             Perform Capillary Blood Glucose checks as needed.  Exercise Prescription Changes:   Exercise Prescription Changes     Row Name 01/15/21 1100 01/22/21 1300 02/05/21 1000 02/17/21 1100 03/03/21 1500     Response to Exercise   Blood Pressure (Admit) 124/66 130/62 124/70 122/76 122/60   Blood Pressure (Exercise) 196/74  rck 148/70 150/70 110/80 124/74 122/72   Blood Pressure (Exit) 132/66 126/62 124/62 104/68 122/64   Heart Rate (Admit) 82 bpm 62 bpm 81 bpm 92 bpm 104 bpm   Heart Rate (Exercise) 122 bpm 110 bpm 107 bpm 113 bpm 123 bpm   Heart Rate (Exit) 83 bpm 71 bpm 82 bpm 68 bpm 96 bpm   Oxygen Saturation (Admit) 98 % -- -- -- --   Oxygen Saturation (Exercise) 96 % -- -- -- --   Rating of Perceived Exertion (Exercise) _0 Perceived Dyspnea (Exercise) 1 -- -- -- --   Symptoms hip pain 3/10, chest pain 2/10, SOB hip pain walking none none weak   Comments walk test results -- -- -- --   Duration -- Progress to 30 minutes of  aerobic without signs/symptoms of physical distress Progress to 30 minutes of  aerobic without signs/symptoms of physical distress Progress to 30 minutes of  aerobic without signs/symptoms of physical distress Continue with 30 min  of aerobic exercise without signs/symptoms of physical distress.   Intensity -- THRR unchanged THRR unchanged THRR unchanged THRR unchanged     Progression   Progression -- Continue to progress workloads to maintain intensity without signs/symptoms of physical distress. Continue to progress workloads to maintain intensity without  signs/symptoms of physical distress. Continue to progress workloads to maintain intensity without signs/symptoms of physical distress. Continue to progress workloads to maintain intensity without signs/symptoms of physical distress.   Average METs -- 2.7 2.33 2.5 1.96     Resistance Training   Training Prescription -- Yes Yes Yes Yes   Weight -- 4 lb 4 lb 4 lb 4 lb   Reps -- 10-15 10-15 10-15 10-15     Interval Training   Interval Training -- -- No No No     Treadmill   MPH -- 0.7 1.1 -- --   Grade -- -- 0 -- --   Minutes -- 15 15 -- --   METs -- -- 1.8 -- --     Recumbant Bike   Level -- -- 2 -- --   RPM -- -- 60 -- --   Watts -- -- 25 -- --   Minutes -- -- 15 -- --   METs -- -- 2.71 -- --     NuStep   Level -- -- 1 -- 3   Minutes -- -- 15 -- 15   METs -- -- 2.4 -- 2.1     Arm Ergometer   Level -- -- -- 1 --   Minutes -- -- -- 15 --   METs -- -- -- 2 --     Recumbant Elliptical   Level -- -- 1 -- --   Minutes -- -- 15 -- --   METs -- -- 1.4 -- --     REL-XR   Level -- 1 -- -- --   Speed -- 50 -- -- --   Minutes -- 15 -- -- --   METs -- 2.7 -- -- --     Track   Laps -- -- -- 25 15   Minutes -- -- -- 15 15   METs -- -- -- 2.36 1.82    Row Name 03/07/21 1000 03/17/21 1100           Response to Exercise   Blood Pressure (Admit) -- 122/64      Blood Pressure (Exit) -- 110/62      Heart Rate (Admit) -- 82 bpm      Heart Rate (Exercise) -- 113 bpm      Heart Rate (Exit) -- 83 bpm      Rating of Perceived Exertion (Exercise) -- 13      Duration -- Continue with 30 min of aerobic exercise without signs/symptoms of physical distress.      Intensity -- THRR unchanged             Progression   Progression -- Continue to progress workloads to maintain intensity without signs/symptoms of physical distress.      Average METs -- 2.8             Resistance Training   Training Prescription -- Yes      Weight -- 4 lb      Reps -- 10-15             Interval  Training   Interval Training -- No             Arm Ergometer   Level --  1      Minutes -- 15      METs -- 1             Track   Laps -- 25      Minutes -- 15      METs -- 2.36             Home Exercise Plan   Plans to continue exercise at Home (comment)  walking, staff videos Home (comment)  walking, staff videos      Frequency Add 2 additional days to program exercise sessions. Add 2 additional days to program exercise sessions.      Initial Home Exercises Provided 03/07/21 03/07/21               Exercise Comments:   Exercise Comments     Row Name 01/27/21 1000           Exercise Comments [employer]. did not complete his rehab session.  Blood sugar is 317m/DL He has asked his provider for a  meter like the Freestyle.   He was advised to call his provider and let them know he does not have a working meter and his blood sugar was elevated this AM.  He normally has been arrive with blood sugar in 200 range.                Exercise Goals and Review:   Exercise Goals     Row Name 01/15/21 1145             Exercise Goals   Increase Physical Activity Yes       Intervention Provide advice, education, support and counseling about physical activity/exercise needs.;Develop an individualized exercise prescription for aerobic and resistive training based on initial evaluation findings, risk stratification, comorbidities and participant's personal goals.       Expected Outcomes Short Term: Attend rehab on a regular basis to increase amount of physical activity.;Long Term: Add in home exercise to make exercise part of routine and to increase amount of physical activity.;Long Term: Exercising regularly at least 3-5 days a week.       Increase Strength and Stamina Yes       Intervention Provide advice, education, support and counseling about physical activity/exercise needs.;Develop an individualized exercise prescription for aerobic and resistive training based  on initial evaluation findings, risk stratification, comorbidities and participant's personal goals.       Expected Outcomes Short Term: Increase workloads from initial exercise prescription for resistance, speed, and METs.;Short Term: Perform resistance training exercises routinely during rehab and add in resistance training at home;Long Term: Improve cardiorespiratory fitness, muscular endurance and strength as measured by increased METs and functional capacity (6MWT)       Able to understand and use rate of perceived exertion (RPE) scale Yes       Intervention Provide education and explanation on how to use RPE scale       Expected Outcomes Short Term: Able to use RPE daily in rehab to express subjective intensity level;Long Term:  Able to use RPE to guide intensity level when exercising independently       Able to understand and use Dyspnea scale Yes       Intervention Provide education and explanation on how to use Dyspnea scale       Expected Outcomes Short Term: Able to use Dyspnea scale daily in rehab to express subjective sense of shortness of breath during exertion;Long Term: Able to use Dyspnea  scale to guide intensity level when exercising independently       Knowledge and understanding of Target Heart Rate Range (THRR) Yes       Intervention Provide education and explanation of THRR including how the numbers were predicted and where they are located for reference       Expected Outcomes Short Term: Able to state/look up THRR;Long Term: Able to use THRR to govern intensity when exercising independently;Short Term: Able to use daily as guideline for intensity in rehab       Able to check pulse independently Yes       Intervention Provide education and demonstration on how to check pulse in carotid and radial arteries.;Review the importance of being able to check your own pulse for safety during independent exercise       Expected Outcomes Short Term: Able to explain why pulse checking is  important during independent exercise;Long Term: Able to check pulse independently and accurately       Understanding of Exercise Prescription Yes       Intervention Provide education, explanation, and written materials on patient's individual exercise prescription       Expected Outcomes Short Term: Able to explain program exercise prescription;Long Term: Able to explain home exercise prescription to exercise independently                Exercise Goals Re-Evaluation :  Exercise Goals Re-Evaluation     Row Name 01/22/21 1308 02/05/21 1037 02/17/21 1141 03/03/21 1538 03/07/21 1027     Exercise Goal Re-Evaluation   Exercise Goals Review Increase Physical Activity;Increase Strength and Stamina;Able to understand and use rate of perceived exertion (RPE) scale Increase Physical Activity;Understanding of Exercise Prescription;Increase Strength and Stamina Increase Physical Activity;Increase Strength and Stamina Increase Physical Activity;Increase Strength and Stamina;Understanding of Exercise Prescription Increase Physical Activity;Increase Strength and Stamina;Understanding of Exercise Prescription;Able to understand and use rate of perceived exertion (RPE) scale;Able to understand and use Dyspnea scale;Knowledge and understanding of Target Heart Rate Range (THRR);Able to check pulse independently   Comments First full day of exercise!  Patient was oriented to gym and equipment including functions, settings, policies, and procedures.  Patient's individual exercise prescription and treatment plan were reviewed.  All starting workloads were established based on the results of the 6 minute walk test done at initial orientation visit.  The plan for exercise progression was also introduced and progression will be customized based on patient's performance and goals. Camil is tolerating exercise well the first couple sessions that he has been here. RPEs have been appropriate so far. We will continue to  monitor his progression as he tries new machines. Chaitanya has done well walking on the track.  He has worked up to 27 laps!  Staff will monitor progress. [place] returned today after been out since 8/24.  He has been out with a UTI that spread.  He walk still feeling weak today and took things slow.  He was happy to be return to rehab. Reviewed home exercise with pt today.  Pt plans to walk and use staff videos at home for exercise.  Reviewed THR, pulse, RPE, sign and symptoms, pulse oximetery and when to call 911 or MD.  Also discussed weather considerations and indoor options.  Pt voiced understanding.   Expected Outcomes Short: Use RPE daily to regulate intensity. Long: Follow program prescription in THR. Short: Work on increasing loads as tolerated. Long: Continue to increase overall MET level Short:  continue to build stamina walking Long:  improve MET level Short: Rebuild stamina back up Long; Continue to improve stamina. Short: Start to add in more walking at home Long: Continue to improve stamina.    [place] Name 03/10/21 [id number] 03/17/21 1110           Exercise Goal Re-Evaluation   Exercise Goals Review Increase Physical Activity;Increase Strength and Stamina;Understanding of Exercise Prescription Increase Physical Activity;Increase Strength and Stamina      Comments Parmvir is doing well in rehab. We reviewed home exercise last week.  He is trying to walk some on his off days.  He is starting to regain his strength.  He was feeling better before he got sick and is starting to bounce back again. [place] continues to do well.  He is up to 25 laps on the track.  He reaches his THR range while walking.      Expected Outcomes Short: Start to add in more walking at home Long: Continue to improve stamina. Short: continue to work in [employer] range Long: improve overall MET level               Discharge Exercise Prescription (Final Exercise Prescription Changes):  Exercise Prescription Changes - 03/17/21 1100        Response to Exercise   Blood Pressure (Admit) 122/64    Blood Pressure (Exit) 110/62    Heart Rate (Admit) 82 bpm    Heart Rate (Exercise) 113 bpm    Heart Rate (Exit) 83 bpm    Rating of Perceived Exertion (Exercise) 13    Duration Continue with 30 min of aerobic exercise without signs/symptoms of physical distress.    Intensity THRR unchanged      Progression   Progression Continue to progress workloads to maintain intensity without signs/symptoms of physical distress.    Average METs 2.8      Resistance Training   Training Prescription Yes    Weight 4 lb    Reps 10-15      Interval Training   Interval Training No      Arm Ergometer   Level 1    Minutes 15    METs 1      Track   Laps 25    Minutes 15    METs 2.36      Home Exercise Plan   Plans to continue exercise at Home (comment)   walking, staff videos   Frequency Add 2 additional days to program exercise sessions.    Initial Home Exercises Provided 03/07/21             Nutrition:  Target Goals: Understanding of nutrition guidelines, daily intake of sodium <155m, cholesterol <2080m calories 30% from fat and 7% or less from saturated fats, daily to have 5 or more servings of fruits and vegetables.  Education: All About Nutrition: -Group instruction provided by verbal, written material, interactive activities, discussions, models, and posters to present general guidelines for heart healthy nutrition including fat, fiber, MyPlate, the role of sodium in heart healthy nutrition, utilization of the nutrition label, and utilization of this knowledge for meal planning. Follow up email sent as well. Written material given at graduation. [facility]  Education need identified 01/15/21  Date 03/12/21  [facility] Review Code 1- Verbalizes Understanding       Biometrics:  Pre Biometrics - 01/15/21 1145       Pre Biometrics    Height 5' 11.6" (1.819 m)  Weight 244 lb 12.8 oz (111 kg)    BMI (Calculated) 33.56    Single Leg Stand 30 seconds              Nutrition Therapy Plan and Nutrition Goals:  Nutrition Therapy & Goals - 02/03/21 1003       Nutrition Therapy   RD appointment deferred Yes   Antonios is not interested in talking with RD at this time - he did want handout - heart healthy guidelines provided            Nutrition Assessments:  MEDIFICTS Score Key: ?70 Need to make dietary changes  40-70 Heart Healthy Diet ? 40 Therapeutic Level Cholesterol Diet  Flowsheet Row Cardiac Rehab from 01/15/2021 in [facility] Cardiac and Pulmonary Rehab  Picture Your Plate Total Score on Admission 64      Picture Your Plate Scores: <06 Unhealthy dietary pattern with much room for improvement. 41-50 Dietary pattern unlikely to meet recommendations for good health and room for improvement. 51-60 More healthful dietary pattern, with some room for improvement.  >60 Healthy dietary pattern, although there may be some specific behaviors that could be improved.    Nutrition Goals Re-Evaluation:  Nutrition Goals Re-Evaluation     [place] Name 03/10/21 1007             Goals   Nutrition Goal Heart Healthy       Comment Lynnae Sandhoff declined meeting with dietician.  He tries to eat a balanced meal but not interested in making changes.       Expected Outcome Focus on heart healthy eating                Nutrition Goals Discharge (Final Nutrition Goals Re-Evaluation):  Nutrition Goals Re-Evaluation - 03/10/21 1007       Goals   Nutrition Goal Heart Healthy    Comment Lynnae Sandhoff declined meeting with dietician.  He tries to eat a balanced meal but not interested in making changes.    Expected Outcome Focus on heart healthy eating             Psychosocial: Target Goals: Acknowledge presence or absence of significant depression and/or stress, maximize coping skills, provide positive support system.  Participant is able to verbalize types and ability to use techniques and skills needed for reducing stress and depression.   Education: Stress, Anxiety, and Depression - Group verbal and visual presentation to define topics covered.  Reviews how body is impacted by stress, anxiety, and depression.  Also discusses healthy ways to reduce stress and to treat/manage anxiety and depression.  Written material given at graduation. Flowsheet Row Cardiac Rehab from 03/12/2021 in [facility] Cardiac and Pulmonary Rehab  Date 01/29/21  Educator [facility]  Instruction Review Code 1- Verbalizes Understanding       Education: Sleep Hygiene -Provides group verbal and written instruction about how sleep can affect your health.  Define sleep hygiene, discuss sleep cycles and impact of sleep habits. Review good sleep hygiene tips.    Initial Review & Psychosocial Screening:  Initial Psych Review & Screening - 01/08/21 1416       Initial Review   Current issues with Current Sleep Concerns;Current Stress Concerns    Source of Stress Concerns Chronic Illness;Financial    Comments if he naps during day he has a hard time getting to sleep, finances and health are stable but not great      Family Dynamics   Good Support System? Yes   daughter calls to  check in, son lives in [place] but rest live nearby     Barriers   Psychosocial barriers to participate in program The patient should benefit from training in stress management and relaxation.;Psychosocial barriers identified (see note)      Screening Interventions   Interventions Encouraged to exercise;Provide feedback about the scores to participant;To provide support and resources with identified psychosocial needs    Expected Outcomes Short Term goal: Utilizing psychosocial counselor, staff and physician to assist with identification of specific Stressors or current issues interfering with healing process. Setting desired goal for each stressor or current issue  identified.;Long Term Goal: Stressors or current issues are controlled or eliminated.;Long Term goal: The participant improves quality of Life and PHQ9 Scores as seen by post scores and/or verbalization of changes;Short Term goal: Identification and review with participant of any Quality of Life or Depression concerns found by scoring the questionnaire.             Quality of Life Scores:   Quality of Life - 01/15/21 1145       Quality of Life   Select Quality of Life      Quality of Life Scores   Health/Function Pre 19.29 %    Socioeconomic Pre 28.29 %    Psych/Spiritual Pre 22.29 %    Family Pre 25.5 %    GLOBAL Pre 22.69 %            Scores of 19 and below usually indicate a poorer quality of life in these areas.  A difference of  2-3 points is a clinically meaningful difference.  A difference of 2-3 points in the total score of the Quality of Life Index has been associated with significant improvement in overall quality of life, self-image, physical symptoms, and general health in studies assessing change in quality of life.  PHQ-9: Recent Review Flowsheet Data     Depression screen [facility] 2/9 01/15/2021   Decreased Interest 1   Down, Depressed, Hopeless 0   PHQ - 2 Score 1   Altered sleeping 0   Tired, decreased energy 3   Change in appetite 0   Feeling bad or failure about yourself  0   Trouble concentrating 0   Moving slowly or fidgety/restless 0   Suicidal thoughts 0   PHQ-9 Score 4   Difficult doing work/chores Very difficult      Interpretation of Total Score  Total Score Depression Severity:  1-4 = Minimal depression, 5-9 = Mild depression, 10-14 = Moderate depression, 15-19 = Moderately severe depression, 20-27 = Severe depression   Psychosocial Evaluation and Intervention:  Psychosocial Evaluation - 01/08/21 1420       Psychosocial Evaluation & Interventions   Interventions Stress management education;Encouraged to exercise with the program and follow  exercise prescription    Comments Mr. 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  He has never done rehab before with his prior events but really wants to get moving again and feel better overall.  He wants to rebuild his strength to be able to do what he would like at home.  He denies any depression or anxiety.  He usually sleeps well at night and gets about 8hrs a night.  If he naps, he has a harder time getting to sleep at night.  He has a great support system as his son and daughter both live within 500 ft and check in often.  His son is currently in [place] by his wife checks  in on him.  He also has a history of diabetes and kidney disease.  His kidneys are doing well and he is hoping to get a FreeStyle meter from his doctor to make checking his sugars easier.    Expected Outcomes Short: Start rehab to build strength Long: Attend regularly to build up stamina.    Continue Psychosocial Services  Follow up required by staff             Psychosocial Re-Evaluation:  Psychosocial Re-Evaluation     [place] Name 03/10/21 0959             Psychosocial Re-Evaluation   Current issues with Current Stress Concerns;Current Sleep Concerns       Comments Clint is doing well in rehab.  His "can do is not keeping up with his want to" which he finds frustrating. He tries not to let things get to him too much.  He is sleeping pretty good when he is able to sleep. He does nap some during the day which can disrupt his going to sleep at night.       Expected Outcomes Short: Continue to work on sleeping Long: Continue to focus on positive       Interventions Encouraged to attend Cardiac Rehabilitation for the exercise;Stress management education       Continue Psychosocial Services  Follow up required by staff                Psychosocial Discharge (Final Psychosocial Re-Evaluation):  Psychosocial Re-Evaluation - 03/10/21 0959       Psychosocial Re-Evaluation   Current issues with  Current Stress Concerns;Current Sleep Concerns    Comments Ingvald is doing well in rehab.  His "can do is not keeping up with his want to" which he finds frustrating. He tries not to let things get to him too much.  He is sleeping pretty good when he is able to sleep. He does nap some during the day which can disrupt his going to sleep at night.    Expected Outcomes Short: Continue to work on sleeping Long: Continue to focus on positive    Interventions Encouraged to attend Cardiac Rehabilitation for the exercise;Stress management education    Continue Psychosocial Services  Follow up required by staff             Vocational Rehabilitation: Provide vocational rehab assistance to qualifying candidates.   Vocational Rehab Evaluation & Intervention:   Education: Education Goals: Education classes will be provided on a variety of topics geared toward better understanding of heart health and risk factor modification. Participant will state understanding/return demonstration of topics presented as noted by education test scores.  Learning Barriers/Preferences:  Learning Barriers/Preferences - 01/08/21 1414       Learning Barriers/Preferences   Learning Barriers Sight   glasses   Learning Preferences None             General Cardiac Education Topics:  AED/CPR: - Group verbal and written instruction with the use of models to demonstrate the basic use of the AED with the basic ABC's of resuscitation.   Anatomy and Cardiac Procedures: - Group verbal and visual presentation and models provide information about basic cardiac anatomy and function. Reviews the testing methods done to diagnose heart disease and the outcomes of the test results. Describes the treatment choices: Medical Management, Angioplasty, or Coronary Bypass Surgery for treating various heart conditions including Myocardial Infarction, Angina, Valve Disease, and Cardiac Arrhythmias.  Written material given at  graduation. Flowsheet Row Cardiac Rehab from 03/12/2021 in [facility] Cardiac and Pulmonary Rehab  Education need identified 01/15/21       Medication Safety: - Group verbal and visual instruction to review commonly prescribed medications for heart and lung disease. Reviews the medication, class of the drug, and side effects. Includes the steps to properly store meds and maintain the prescription regimen.  Written material given at graduation. Flowsheet Row Cardiac Rehab from 03/12/2021 in [facility] Cardiac and Pulmonary Rehab  Date 03/05/21  Educator SB  Instruction Review Code 1- Verbalizes Understanding       Intimacy: - Group verbal instruction through game format to discuss how heart and lung disease can affect sexual intimacy. Written material given at graduation.. Flowsheet Row Cardiac Rehab from 03/12/2021 in [facility] Cardiac and Pulmonary Rehab  Date 02/12/21  Educator [facility]  Instruction Review Code 1- Verbalizes Understanding       Know Your Numbers and Heart Failure: - Group verbal and visual instruction to discuss disease risk factors for cardiac and pulmonary disease and treatment options.  Reviews associated critical values for Overweight/Obesity, Hypertension, Cholesterol, and Diabetes.  Discusses basics of heart failure: signs/symptoms and treatments.  Introduces Heart Failure Zone chart for action plan for heart failure.  Written material given at graduation.   Infection Prevention: - Provides verbal and written material to individual with discussion of infection control including proper hand washing and proper equipment cleaning during exercise session. Flowsheet Row Cardiac Rehab from 03/12/2021 in [facility] Cardiac and Pulmonary Rehab  Date 01/15/21  Educator [facility]  Instruction Review Code 1- Verbalizes Understanding       Falls Prevention: - Provides verbal and written material to individual with discussion of falls prevention and safety. Flowsheet Row Cardiac Rehab from 03/12/2021  in [facility] Cardiac and Pulmonary Rehab  Date 01/15/21  Educator [facility]  Instruction Review Code 1- Verbalizes Understanding       Other: -Provides group and verbal instruction on various topics (see comments)   Knowledge Questionnaire Score:  Knowledge Questionnaire Score - 01/15/21 1146       Knowledge Questionnaire Score   Pre Score 24/26 angina, exercise             Core Components/Risk Factors/Patient Goals at Admission:  Personal Goals and Risk Factors at Admission - 01/15/21 1147       Core Components/Risk Factors/Patient Goals on Admission    Weight Management Yes;Weight Loss;Obesity    Intervention Weight Management: Develop a combined nutrition and exercise program designed to reach desired caloric intake, while maintaining appropriate intake of nutrient and fiber, sodium and fats, and appropriate energy expenditure required for the weight goal.;Weight Management: Provide education and appropriate resources to help participant work on and attain dietary goals.;Weight Management/Obesity: Establish reasonable short term and long term weight goals.;Obesity: Provide education and appropriate resources to help participant work on and attain dietary goals.    Admit Weight 244 lb 12.8 oz (111 kg)    Goal Weight: Short Term 240 lb (108.9 kg)    Goal Weight: Long Term 230 lb (104.3 kg)    Expected Outcomes Short Term: Continue to assess and modify interventions until short term weight is achieved;Long Term: Adherence to nutrition and physical activity/exercise program aimed toward attainment of established weight goal;Weight Loss: Understanding of general recommendations for a balanced deficit meal plan, which promotes 1-2 lb weight loss per week and includes a negative energy balance of [phone] kcal/d;Understanding of distribution of calorie intake throughout the day with the consumption of 4-5  meals/snacks;Understanding recommendations for meals to include 15-35% energy as protein,  25-35% energy from fat, 35-60% energy from carbohydrates, less than 278m of dietary cholesterol, 20-35 gm of total fiber daily    Diabetes Yes    Intervention Provide education about signs/symptoms and action to take for hypo/hyperglycemia.;Provide education about proper nutrition, including hydration, and aerobic/resistive exercise prescription along with prescribed medications to achieve blood glucose in normal ranges: Fasting glucose 65-99 mg/dL    Expected Outcomes Short Term: Participant verbalizes understanding of the signs/symptoms and immediate care of hyper/hypoglycemia, proper foot care and importance of medication, aerobic/resistive exercise and nutrition plan for blood glucose control.;Long Term: Attainment of HbA1C < 7%.    Heart Failure Yes    Intervention Provide a combined exercise and nutrition program that is supplemented with education, support and counseling about heart failure. Directed toward relieving symptoms such as shortness of breath, decreased exercise tolerance, and extremity edema.    Expected Outcomes Improve functional capacity of life;Short term: Attendance in program 2-3 days a week with increased exercise capacity. Reported lower sodium intake. Reported increased fruit and vegetable intake. Reports medication compliance.;Short term: Daily weights obtained and reported for increase. Utilizing diuretic protocols set by physician.;Long term: Adoption of self-care skills and reduction of barriers for early signs and symptoms recognition and intervention leading to self-care maintenance.    Hypertension Yes    Intervention Provide education on lifestyle modifcations including regular physical activity/exercise, weight management, moderate sodium restriction and increased consumption of fresh fruit, vegetables, and low fat dairy, alcohol moderation, and smoking cessation.;Monitor prescription use compliance.    Expected Outcomes Short Term: Continued assessment and intervention  until BP is < 140/989mHG in hypertensive participants. < 130/8019mG in hypertensive participants with diabetes, heart failure or chronic kidney disease.;Long Term: Maintenance of blood pressure at goal levels.    Lipids Yes    Intervention Provide education and support for participant on nutrition & aerobic/resistive exercise along with prescribed medications to achieve LDL <109m69mDL >40mg95m Expected Outcomes Long Term: Cholesterol controlled with medications as prescribed, with individualized exercise RX and with personalized nutrition plan. Value goals: LDL < 109mg,7m > 40 mg.;Short Term: Participant states understanding of desired cholesterol values and is compliant with medications prescribed. Participant is following exercise prescription and nutrition guidelines.             Education:Diabetes - Individual verbal and written instruction to review signs/symptoms of diabetes, desired ranges of glucose level fasting, after meals and with exercise. Acknowledge that pre and post exercise glucose checks will be done for 3 sessions at entry of program. [place] [facility]  Date 01/15/21  [facility] Code 1- Verbalizes Understanding       Core Components/Risk Factors/Patient Goals Review:   Goals and Risk Factor Review     Row Name 03/10/21 1003             Core Components/Risk Factors/Patient Goals Review   Personal Goals Review Weight Management/Obesity;Heart Failure;Hypertension;Diabetes;Lipids       Review VernonThorining well in rehab.  He is losing some weight and wants to get down to 230 lb as he wants to try sky diving.  He is still recovering from his UTI and trying to bounce back.  He weighs daily and tries to stay on top of his heart failure and denies any symptoms. His blood pressures are doing well and he checks it  occasion at home. He has been using a wrist monitor at home and was  encouraged to bring it into class to check it.  His blood sugars are doing better around 160-170 mg/dl on average.  He is pleased with progress.  He now has a new meter.       Expected Outcomes Short: Start using new meter for sugar and bring in cuff for review Long: continue to work on weight loss.                Core Components/Risk Factors/Patient Goals at Discharge (Final Review):   Goals and Risk Factor Review - 03/10/21 1003       Core Components/Risk Factors/Patient Goals Review   Personal Goals Review Weight Management/Obesity;Heart Failure;Hypertension;Diabetes;Lipids    Review Saivon is doing well in rehab.  He is losing some weight and wants to get down to 230 lb as he wants to try sky diving.  He is still recovering from his UTI and trying to bounce back.  He weighs daily and tries to stay on top of his heart failure and denies any symptoms. His blood pressures are doing well and he checks it occasion at home. He has been using a wrist monitor at home and was encouraged to bring it into class to check it.  His blood sugars are doing better around 160-170 mg/dl on average.  He is pleased with progress.  He now has a new meter.    Expected Outcomes Short: Start using new meter for sugar and bring in cuff for review Long: continue to work on weight loss.             ITP Comments:  ITP Comments     Row Name 01/08/21 1425 01/15/21 1140 01/27/21 1000 01/29/21 0725 02/26/21 0626   ITP Comments Completed virtual orientation today.  EP evaluation is scheduled for Wednesday 01/15/21 at 930am.  Documentation for diagnosis can be found in [facility] encounter 12/20/20. Completed 6MWT and gym orientation. Initial ITP created and sent for review to Dr. Emily Filbert, Medical Director. [place] did not complete his rehab session.  Blood sugar is 359m/DL He has asked his provider for a  meter like the Freestyle.   He was advised to call his provider and let them know he does not have a  working meter and his blood sugar was elevated this AM.  He normally has been arrive with blood sugar in 200 range. 30 Day review completed. Medical Director ITP review done, changes made as directed, and signed approval by Medical Director. 30 Day review completed. Medical Director ITP review done, changes made as directed, and signed approval by Medical Director.    RSmithersName 03/03/21 1537 03/26/21 0922         ITP Comments VNikoreturned to rehab today.  He is feeling very weak and fatigued.  He has been out with a UTI that spead to his testicles.  He wanted to return to start to rebuild his stamina. 30 day review completed. ITP sent to Dr. MEmily Filbert Medical Director of Cardiac Rehab. Continue with ITP unless changes are made by physician.               Comments: 30 day review

## 2021-03-28 ENCOUNTER — Other Ambulatory Visit: Payer: Self-pay

## 2021-03-28 ENCOUNTER — Encounter: Payer: Medicare HMO | Admitting: *Deleted

## 2021-03-28 DIAGNOSIS — I214 Non-ST elevation (NSTEMI) myocardial infarction: Secondary | ICD-10-CM

## 2021-03-28 DIAGNOSIS — Z955 Presence of coronary angioplasty implant and graft: Secondary | ICD-10-CM | POA: Diagnosis not present

## 2021-03-28 DIAGNOSIS — I252 Old myocardial infarction: Secondary | ICD-10-CM | POA: Diagnosis not present

## 2021-03-28 DIAGNOSIS — Z48812 Encounter for surgical aftercare following surgery on the circulatory system: Secondary | ICD-10-CM | POA: Diagnosis not present

## 2021-03-28 NOTE — Progress Notes (Signed)
Daily Session Note  Patient Details  Name: Aaron [name]. MRN: [id number] Date of Birth: 12-12-1945 Referring Provider:   Flowsheet Row Cardiac Rehab from 01/15/2021 in [facility] Cardiac and Pulmonary Rehab  Referring Provider Serafina Royals MD       Encounter Date: 03/28/2021  Check In:  Session Check In - 03/28/21 0946       Check-In   Supervising physician immediately available to respond to emergencies See telemetry face sheet for immediately available ER MD    Location [facility]-Cardiac & Pulmonary Rehab    Staff Present Renita Papa, RN BSN;Joseph [place], RCP,RRT,BSRT;Jessica [place], [place], RCEP, CCRP, CCET    Virtual Visit No    Medication changes reported     No    Fall or balance concerns reported    No    Warm-up and Cool-down Performed on first and last piece of equipment    Resistance Training Performed Yes    VAD Patient? No    PAD/SET Patient? No      Pain Assessment   Currently in Pain? No/denies                Social History   Tobacco Use  Smoking Status Former  Smokeless Tobacco Former  Tobacco Comments   7/20 quit 35 years ago    Goals Met:  Independence with exercise equipment Exercise tolerated well No report of concerns or symptoms today Strength training completed today  Goals Unmet:  Not Applicable  Comments: Pt able to follow exercise prescription today without complaint.  Will continue to monitor for progression.    Dr. Emily Filbert is Medical Director for [place].  Dr. Ottie Glazier is Medical Director for [facility] Pulmonary Rehabilitation.

## 2021-03-31 ENCOUNTER — Other Ambulatory Visit: Payer: Self-pay

## 2021-03-31 DIAGNOSIS — Z48812 Encounter for surgical aftercare following surgery on the circulatory system: Secondary | ICD-10-CM | POA: Diagnosis not present

## 2021-03-31 DIAGNOSIS — I214 Non-ST elevation (NSTEMI) myocardial infarction: Secondary | ICD-10-CM

## 2021-03-31 DIAGNOSIS — Z955 Presence of coronary angioplasty implant and graft: Secondary | ICD-10-CM | POA: Diagnosis not present

## 2021-03-31 DIAGNOSIS — I252 Old myocardial infarction: Secondary | ICD-10-CM | POA: Diagnosis not present

## 2021-03-31 NOTE — Progress Notes (Signed)
Daily Session Note  Patient Details  Name: Aaron [name]. MRN: [id number] Date of Birth: 04-15-1946 Referring Provider:   Flowsheet Row Cardiac Rehab from 01/15/2021 in [facility] Cardiac and Pulmonary Rehab  Referring Provider Serafina Royals MD       Encounter Date: 03/31/2021  Check In:  Session Check In - 03/31/21 0940       Check-In   Supervising physician immediately available to respond to emergencies See telemetry face sheet for immediately available ER MD    Location [facility]-Cardiac & Pulmonary Rehab    Staff Present Nada Maclachlan, BA, ACSM CEP, Exercise Physiologist;Deasia Chiu, RN,BC,MSN;Kelly Amedeo Plenty, BS, ACSM CEP, Exercise Physiologist    Virtual Visit No    Medication changes reported     No    Fall or balance concerns reported    No    Tobacco Cessation No Change    Warm-up and Cool-down Performed on first and last piece of equipment    Resistance Training Performed Yes    VAD Patient? No    PAD/SET Patient? No      Pain Assessment   Currently in Pain? No/denies    Multiple Pain Sites No                Social History   Tobacco Use  Smoking Status Former  Smokeless Tobacco Former  Tobacco Comments   7/20 quit 35 years ago    Goals Met:  Independence with exercise equipment Exercise tolerated well No report of concerns or symptoms today Strength training completed today  Goals Unmet:  Not Applicable  Comments: Pt able to follow exercise prescription today without complaint.  Will continue to monitor for progression.    Dr. Emily Filbert is Medical Director for [place].  Dr. Ottie Glazier is Medical Director for [facility] Pulmonary Rehabilitation.

## 2021-04-02 ENCOUNTER — Other Ambulatory Visit: Payer: Self-pay

## 2021-04-02 DIAGNOSIS — Z955 Presence of coronary angioplasty implant and graft: Secondary | ICD-10-CM | POA: Diagnosis not present

## 2021-04-02 DIAGNOSIS — Z48812 Encounter for surgical aftercare following surgery on the circulatory system: Secondary | ICD-10-CM | POA: Diagnosis not present

## 2021-04-02 DIAGNOSIS — I252 Old myocardial infarction: Secondary | ICD-10-CM | POA: Diagnosis not present

## 2021-04-02 DIAGNOSIS — I214 Non-ST elevation (NSTEMI) myocardial infarction: Secondary | ICD-10-CM

## 2021-04-02 NOTE — Progress Notes (Signed)
Daily Session Note  Patient Details  Name: Aaron [name]. MRN: [id number] Date of Birth: [birth year] Referring Provider:   Flowsheet Row Cardiac Rehab from 01/15/2021 in [facility] Cardiac and Pulmonary Rehab  Referring Provider Serafina Royals MD       Encounter Date: 04/02/2021  Check In:  Session Check In - 04/02/21 1027       Check-In   Supervising physician immediately available to respond to emergencies See telemetry face sheet for immediately available ER MD    Location [facility]-Cardiac & Pulmonary Rehab    Staff Present Birdie Sons, MPA, Elveria Rising, BA, ACSM CEP, Exercise Physiologist;Joseph Tessie Fass, [place]    Virtual Visit No    Medication changes reported     No    Fall or balance concerns reported    No    Tobacco Cessation No Change    Warm-up and Cool-down Performed on first and last piece of equipment    Resistance Training Performed Yes    VAD Patient? No    PAD/SET Patient? No      Pain Assessment   Currently in Pain? No/denies                Social History   Tobacco Use  Smoking Status Former  Smokeless Tobacco Former  Tobacco Comments   7/20 quit 35 years ago    Goals Met:  Independence with exercise equipment Exercise tolerated well No report of concerns or symptoms today Strength training completed today  Goals Unmet:  Not Applicable  Comments: Pt able to follow exercise prescription today without complaint.  Will continue to monitor for progression.    Dr. Emily Filbert is Medical Director for [facility] [place].  Dr. Ottie Glazier is Medical Director for [facility] Pulmonary Rehabilitation.

## 2021-04-04 ENCOUNTER — Other Ambulatory Visit: Payer: Self-pay

## 2021-04-04 DIAGNOSIS — I252 Old myocardial infarction: Secondary | ICD-10-CM | POA: Diagnosis not present

## 2021-04-04 DIAGNOSIS — Z955 Presence of coronary angioplasty implant and graft: Secondary | ICD-10-CM | POA: Diagnosis not present

## 2021-04-04 DIAGNOSIS — Z48812 Encounter for surgical aftercare following surgery on the circulatory system: Secondary | ICD-10-CM | POA: Diagnosis not present

## 2021-04-04 DIAGNOSIS — I214 Non-ST elevation (NSTEMI) myocardial infarction: Secondary | ICD-10-CM

## 2021-04-04 NOTE — Progress Notes (Signed)
Daily Session Note  Patient Details  Name: Aaron [name]. MRN: [id number] Date of Birth: 1946-02-20 Referring Provider:   Flowsheet Row Cardiac Rehab from 01/15/2021 in [facility] Cardiac and Pulmonary Rehab  Referring Provider Serafina Royals MD       Encounter Date: 04/04/2021  Check In:  Session Check In - 04/04/21 0939       Check-In   Supervising physician immediately available to respond to emergencies See telemetry face sheet for immediately available ER MD    Location [facility]-Cardiac & Pulmonary Rehab    Staff Present Birdie Sons, MPA, RN;Jessica [place], MA, RCEP, CCRP, CCET;Joseph [place], [place]    Virtual Visit No    Medication changes reported     No    Fall or balance concerns reported    No    Tobacco Cessation No Change    Warm-up and Cool-down Performed on first and last piece of equipment    Resistance Training Performed Yes    VAD Patient? No    PAD/SET Patient? No      Pain Assessment   Currently in Pain? No/denies                Social History   Tobacco Use  Smoking Status Former  Smokeless Tobacco Former  Tobacco Comments   7/20 quit 35 years ago    Goals Met:  Independence with exercise equipment Exercise tolerated well No report of concerns or symptoms today Strength training completed today  Goals Unmet:  Not Applicable  Comments: Pt able to follow exercise prescription today without complaint.  Will continue to monitor for progression.    Dr. Emily Filbert is Medical Director for [place].  Dr. Ottie Glazier is Medical Director for [facility] Pulmonary Rehabilitation.

## 2021-04-07 ENCOUNTER — Other Ambulatory Visit: Payer: Self-pay

## 2021-04-07 ENCOUNTER — Encounter: Payer: Medicare HMO | Admitting: *Deleted

## 2021-04-07 DIAGNOSIS — Z955 Presence of coronary angioplasty implant and graft: Secondary | ICD-10-CM

## 2021-04-07 DIAGNOSIS — Z48812 Encounter for surgical aftercare following surgery on the circulatory system: Secondary | ICD-10-CM | POA: Diagnosis not present

## 2021-04-07 DIAGNOSIS — I214 Non-ST elevation (NSTEMI) myocardial infarction: Secondary | ICD-10-CM

## 2021-04-07 DIAGNOSIS — I252 Old myocardial infarction: Secondary | ICD-10-CM | POA: Diagnosis not present

## 2021-04-07 NOTE — Progress Notes (Signed)
Daily Session Note  Patient Details  Name: Aaron [name]. MRN: [id number] Date of Birth: 06/13/1946 Referring Provider:   Flowsheet Row Cardiac Rehab from 01/15/2021 in [facility] Cardiac and Pulmonary Rehab  Referring Provider Serafina Royals MD       Encounter Date: 04/07/2021  Check In:  Session Check In - 04/07/21 1036       Check-In   Supervising physician immediately available to respond to emergencies See telemetry face sheet for immediately available ER MD    Location [facility]-Cardiac & Pulmonary Rehab    Staff Present Renita Papa, RN Moises Blood, BS, ACSM CEP, Exercise Physiologist;Amanda Oletta Darter, [place], ACSM CEP, Exercise Physiologist    Virtual Visit No    Medication changes reported     No    Fall or balance concerns reported    No    Warm-up and Cool-down Performed on first and last piece of equipment    Resistance Training Performed Yes    VAD Patient? No    PAD/SET Patient? No      Pain Assessment   Currently in Pain? No/denies                Social History   Tobacco Use  Smoking Status Former  Smokeless Tobacco Former  Tobacco Comments   7/20 quit 35 years ago    Goals Met:  Independence with exercise equipment Exercise tolerated well No report of concerns or symptoms today Strength training completed today  Goals Unmet:  Not Applicable  Comments: Pt able to follow exercise prescription today without complaint.  Will continue to monitor for progression.    Dr. Emily Filbert is Medical Director for [place].  Dr. Ottie Glazier is Medical Director for [facility] Pulmonary Rehabilitation.

## 2021-04-09 ENCOUNTER — Other Ambulatory Visit: Payer: Self-pay

## 2021-04-09 DIAGNOSIS — Z955 Presence of coronary angioplasty implant and graft: Secondary | ICD-10-CM

## 2021-04-09 DIAGNOSIS — I214 Non-ST elevation (NSTEMI) myocardial infarction: Secondary | ICD-10-CM

## 2021-04-09 DIAGNOSIS — Z48812 Encounter for surgical aftercare following surgery on the circulatory system: Secondary | ICD-10-CM | POA: Diagnosis not present

## 2021-04-09 DIAGNOSIS — I252 Old myocardial infarction: Secondary | ICD-10-CM | POA: Diagnosis not present

## 2021-04-09 LAB — GLUCOSE, CAPILLARY: Glucose-Capillary: 367 mg/dL — ABNORMAL HIGH (ref 70–99)

## 2021-04-09 NOTE — Progress Notes (Signed)
Incomplete Session Note  Patient Details  Name: Aaron [name]. MRN: [id number] Date of Birth: 1945-12-17 Referring Provider:   Flowsheet Row Cardiac Rehab from 01/15/2021 in [facility] Cardiac and Pulmonary Rehab  Referring Provider Arnoldo Hooker MD       Encounter Date: 04/09/2021  Check In:  Session Check In - 04/09/21 0945       Check-In   Supervising physician immediately available to respond to emergencies See telemetry face sheet for immediately available ER MD    Location [facility]-Cardiac & Pulmonary Rehab    Staff Present Kelton Pillar, MPA, RN;Amanda Sommer, BA, ACSM CEP, Exercise Physiologist;Joseph Reino [place], [place]    Virtual Visit No    Medication changes reported     No    Fall or balance concerns reported    No    Tobacco Cessation No Change    Warm-up and Cool-down Not performed (comment)   BG too high to exercise 367   Resistance Training Performed No    VAD Patient? No    PAD/SET Patient? No      Pain Assessment   Currently in Pain? No/denies                Social History   Tobacco Use  Smoking Status Former  Smokeless Tobacco Former  Tobacco Comments   7/20 quit 35 years ago      Comments: Pt unable to exercise today due to blood glucose of 367. Patient stated he took his medication today. Patient was educated to check his blood sugar again later today and fasting tomorrow morning. Patient stated understanding and had no further questions.    Dr. Bethann Punches is Medical Director for [facility] Cardiac Rehabilitation.  Dr. Vida Rigger is Medical Director for [facility] Pulmonary Rehabilitation.

## 2021-04-11 ENCOUNTER — Encounter: Payer: Medicare HMO | Admitting: *Deleted

## 2021-04-11 ENCOUNTER — Other Ambulatory Visit: Payer: Self-pay

## 2021-04-11 DIAGNOSIS — Z955 Presence of coronary angioplasty implant and graft: Secondary | ICD-10-CM | POA: Diagnosis not present

## 2021-04-11 DIAGNOSIS — Z48812 Encounter for surgical aftercare following surgery on the circulatory system: Secondary | ICD-10-CM | POA: Diagnosis not present

## 2021-04-11 DIAGNOSIS — I252 Old myocardial infarction: Secondary | ICD-10-CM | POA: Diagnosis not present

## 2021-04-11 DIAGNOSIS — I214 Non-ST elevation (NSTEMI) myocardial infarction: Secondary | ICD-10-CM

## 2021-04-11 NOTE — Progress Notes (Signed)
Daily Session Note  Patient Details  Name: Ryu Cerreta. MRN: [id number] Date of Birth: 27-Jan-1946 Referring Provider:   Flowsheet Row Cardiac Rehab from 01/15/2021 in [facility] Cardiac and Pulmonary Rehab  Referring Provider Serafina Royals MD       Encounter Date: 04/11/2021  Check In:  Session Check In - 04/11/21 0958       Check-In   Supervising physician immediately available to respond to emergencies See telemetry face sheet for immediately available ER MD    Staff Present Nyoka Cowden, RN, BSN, Ardeth Sportsman, RDN, LDN;Susanne Bice, RN, BSN, CCRP    Virtual Visit No    Medication changes reported     No    Fall or balance concerns reported    No    Tobacco Cessation No Change    Warm-up and Cool-down Performed on first and last piece of equipment    Resistance Training Performed Yes    VAD Patient? No    PAD/SET Patient? No      Pain Assessment   Currently in Pain? No/denies                Social History   Tobacco Use  Smoking Status Former  Smokeless Tobacco Former  Tobacco Comments   7/20 quit 35 years ago    Goals Met:  Independence with exercise equipment Exercise tolerated well No report of concerns or symptoms today  Goals Unmet:  Not Applicable  Comments: .exgoo   Dr. Emily Filbert is Medical Director for [place].  Dr. Ottie Glazier is Medical Director for [facility] Pulmonary Rehabilitation.

## 2021-04-11 NOTE — Progress Notes (Signed)
Daily Session Note  Patient Details  Name: Aaron [name]. MRN: [id number] Date of Birth: 04/15/1946 Referring Provider:   Flowsheet Row Cardiac Rehab from 01/15/2021 in [facility] Cardiac and Pulmonary Rehab  Referring Provider Serafina Royals MD       Encounter Date: 04/11/2021  Check In:  Session Check In - 04/11/21 1017       Check-In   Supervising physician immediately available to respond to emergencies See telemetry face sheet for immediately available ER MD    Location [facility]-Cardiac & Pulmonary Rehab    Staff Present Nyoka Cowden, RN, BSN, Ardeth Sportsman, RDN, LDN;Susanne Bice, RN, BSN, CCRP    Virtual Visit No    Medication changes reported     No    Fall or balance concerns reported    No    Tobacco Cessation No Change    Warm-up and Cool-down Performed on first and last piece of equipment    Resistance Training Performed Yes    VAD Patient? No    PAD/SET Patient? No      Pain Assessment   Currently in Pain? No/denies                Social History   Tobacco Use  Smoking Status Former  Smokeless Tobacco Former  Tobacco Comments   7/20 quit 35 years ago    Goals Met:  Independence with exercise equipment Exercise tolerated well No report of concerns or symptoms today  Goals Unmet:  Not Applicable  Comments: .exgoo   Dr. Emily Filbert is Medical Director for [place].  Dr. Ottie Glazier is Medical Director for [facility] Pulmonary Rehabilitation.

## 2021-04-14 ENCOUNTER — Encounter: Payer: Medicare HMO | Admitting: *Deleted

## 2021-04-14 ENCOUNTER — Other Ambulatory Visit: Payer: Self-pay

## 2021-04-14 DIAGNOSIS — Z955 Presence of coronary angioplasty implant and graft: Secondary | ICD-10-CM

## 2021-04-14 DIAGNOSIS — I214 Non-ST elevation (NSTEMI) myocardial infarction: Secondary | ICD-10-CM

## 2021-04-14 NOTE — Progress Notes (Signed)
Daily Session Note  Patient Details  Name: Aaron [name]. MRN: [id number] Date of Birth: Dec 06, 1945 Referring Provider:   Flowsheet Row Cardiac Rehab from 01/15/2021 in [facility] Cardiac and Pulmonary Rehab  Referring Provider Serafina Royals MD       Encounter Date: 04/14/2021  Check In:  Session Check In - 04/14/21 1035       Check-In   Supervising physician immediately available to respond to emergencies See telemetry face sheet for immediately available ER MD    Location [facility]-Cardiac & Pulmonary Rehab    Staff Present Renita Papa, RN Moises Blood, BS, ACSM CEP, Exercise Physiologist;Amanda Oletta Darter, BA, ACSM CEP, Exercise Physiologist;Kelly Rosalia Hammers, MPA, RN    Virtual Visit No    Medication changes reported     No    Fall or balance concerns reported    No    Warm-up and Cool-down Performed on first and last piece of equipment    Resistance Training Performed Yes    VAD Patient? No    PAD/SET Patient? No      Pain Assessment   Currently in Pain? No/denies                Social History   Tobacco Use  Smoking Status Former  Smokeless Tobacco Former  Tobacco Comments   7/20 quit 35 years ago    Goals Met:  Independence with exercise equipment Exercise tolerated well No report of concerns or symptoms today Strength training completed today  Goals Unmet:  Not Applicable  Comments: Pt able to follow exercise prescription today without complaint.  Will continue to monitor for progression.    Dr. Emily Filbert is Medical Director for [place].  Dr. Ottie Glazier is Medical Director for [facility] Pulmonary Rehabilitation.

## 2021-04-16 ENCOUNTER — Other Ambulatory Visit: Payer: Self-pay

## 2021-04-16 VITALS — Ht 71.6 in | Wt 245.3 lb

## 2021-04-16 DIAGNOSIS — Z48812 Encounter for surgical aftercare following surgery on the circulatory system: Secondary | ICD-10-CM | POA: Diagnosis not present

## 2021-04-16 DIAGNOSIS — I214 Non-ST elevation (NSTEMI) myocardial infarction: Secondary | ICD-10-CM

## 2021-04-16 DIAGNOSIS — Z955 Presence of coronary angioplasty implant and graft: Secondary | ICD-10-CM | POA: Diagnosis not present

## 2021-04-16 DIAGNOSIS — I252 Old myocardial infarction: Secondary | ICD-10-CM | POA: Diagnosis not present

## 2021-04-16 NOTE — Progress Notes (Signed)
Daily Session Note  Patient Details  Name: Aaron [name]. MRN: [id number] Date of Birth: 04/05/1946 Referring Provider:   Flowsheet Row Cardiac Rehab from 01/15/2021 in [facility] Cardiac and Pulmonary Rehab  Referring Provider Serafina Royals MD       Encounter Date: 04/16/2021  Check In:  Session Check In - 04/16/21 0958       Check-In   Supervising physician immediately available to respond to emergencies See telemetry face sheet for immediately available ER MD    Location [facility]-Cardiac & Pulmonary Rehab    Staff Present Birdie Sons, MPA, RN;Amanda Oletta Darter, BA, ACSM CEP, Exercise Physiologist;Joseph [place], [place], MA, RCEP, CCRP, CCET    Virtual Visit No    Medication changes reported     No    Fall or balance concerns reported    No    Tobacco Cessation No Change    Warm-up and Cool-down Performed on first and last piece of equipment    Resistance Training Performed Yes    VAD Patient? No    PAD/SET Patient? No      Pain Assessment   Currently in Pain? No/denies                Social History   Tobacco Use  Smoking Status Former  Smokeless Tobacco Former  Tobacco Comments   7/20 quit 35 years ago    Goals Met:  Independence with exercise equipment Exercise tolerated well No report of concerns or symptoms today Strength training completed today  Goals Unmet:  Not Applicable  Comments: Pt able to follow exercise prescription today without complaint.  Will continue to monitor for progression.   [place] Name 01/15/21 1141 04/16/21 1200       6 Minute Walk   Phase Initial Discharge    Distance 895 feet 995 feet    Distance % Change -- 11.1 %    Distance Feet Change -- 100 ft    Walk Time 6 minutes 6 minutes    # of Rest Breaks 0 0    MPH 1.69 1.89    METS 2.34 2.09    RPE 13 13    Perceived Dyspnea  1 2    VO2 Peak 8.18 7.3    Symptoms Yes (comment) Yes (comment)    Comments hip pain 3/10, chest pain  2/10, SOB chest pain 4/10, SOB    Resting HR 82 bpm 66 bpm    Resting BP 124/66 122/68    Resting Oxygen Saturation  98 % --    Exercise Oxygen Saturation  during 6 min walk 96 % --    Max Ex. HR 122 bpm 115 bpm    Max Ex. BP 196/74 158/76    2 Minute Post BP 148/70 --              Dr. Emily Filbert is Medical Director for [place].  Dr. Ottie Glazier is Medical Director for [facility] Pulmonary Rehabilitation.

## 2021-04-17 NOTE — Patient Instructions (Signed)
Discharge Patient Instructions  Patient Details  Name: Aaron [name]. MRN: [id number] Date of Birth: 26-Mar-1946 Referring Provider:  Corey Skains, MD   Number of Visits: 36  Reason for Discharge:  Patient reached a stable level of exercise. Patient independent in their exercise. Patient has met program and personal goals.  Smoking History:  Social History   Tobacco Use  Smoking Status Former  Smokeless Tobacco Former  Tobacco Comments   7/20 quit 35 years ago    Diagnosis:  NSTEMI (non-ST elevated myocardial infarction) ([place])  Status post coronary artery stent placement  Initial Exercise Prescription:  Initial Exercise Prescription - 01/15/21 1100       Date of Initial Exercise RX and Referring Provider   Date 01/15/21    Referring Provider Aaron Royals MD      Treadmill   MPH 1.5    Grade 0.5    Minutes 15    METs 2.25      Recumbant Elliptical   Level 1    RPM 50    Minutes 15    METs 2      REL-XR   Level 1    Speed 50    Minutes 15    METs 2      Prescription Details   Frequency (times per week) 3    Duration Progress to 30 minutes of continuous aerobic without signs/symptoms of physical distress      Intensity   THRR 40-80% of Max Heartrate 107-132    Ratings of Perceived Exertion 11-13    Perceived Dyspnea 0-4      Progression   Progression Continue to progress workloads to maintain intensity without signs/symptoms of physical distress.      Resistance Training   Training Prescription Yes    Weight 4 lb    Reps 10-15             Discharge Exercise Prescription (Final Exercise Prescription Changes):  Exercise Prescription Changes - 04/14/21 1500       Response to Exercise   Blood Pressure (Admit) 138/64    Blood Pressure (Exit) 128/60    Heart Rate (Admit) 83 bpm    Heart Rate (Exercise) 118 bpm    Heart Rate (Exit) 87 bpm    Rating of Perceived Exertion (Exercise) 13    Symptoms none    Duration Continue  with 30 min of aerobic exercise without signs/symptoms of physical distress.    Intensity THRR unchanged      Progression   Progression Continue to progress workloads to maintain intensity without signs/symptoms of physical distress.    Average METs 1.5      Resistance Training   Training Prescription Yes    Weight 5 lb    Reps 10-15      Interval Training   Interval Training No      Arm Ergometer   Level 1.5    METs 1      Track   Laps 20    Minutes 15    METs 2.09      Home Exercise Plan   Plans to continue exercise at Home (comment)   walking, staff videos   Frequency Add 2 additional days to program exercise sessions.    Initial Home Exercises Provided 03/07/21             Functional Capacity:  6 Minute Walk     Row Name 01/15/21 1141 04/16/21 1200       6 Minute  Walk   Phase Initial Discharge    Distance 895 feet 995 feet    Distance % Change -- 11.1 %    Distance Feet Change -- 100 ft    Walk Time 6 minutes 6 minutes    # of Rest Breaks 0 0    MPH 1.69 1.89    METS 2.34 2.09    RPE 13 13    Perceived Dyspnea  1 2    VO2 Peak 8.18 7.3    Symptoms Yes (comment) Yes (comment)    Comments hip pain 3/10, chest pain 2/10, SOB chest pain 4/10, SOB    Resting HR 82 bpm 66 bpm    Resting BP 124/66 122/68    Resting Oxygen Saturation  98 % --    Exercise Oxygen Saturation  during 6 min walk 96 % --    Max Ex. HR 122 bpm 115 bpm    Max Ex. BP 196/74 158/76    2 Minute Post BP 148/70 --              Nutrition & Weight - Outcomes:  Pre Biometrics - 01/15/21 1145       Pre Biometrics   Height 5' 11.6" (1.819 m)    Weight 244 lb 12.8 oz (111 kg)    BMI (Calculated) 33.56    Single Leg Stand 30 seconds             Post Biometrics - 04/16/21 1201        Post  Biometrics   Height 5' 11.6" (1.819 m)    Weight 245 lb 4.8 oz (111.3 kg)    BMI (Calculated) 33.63    Single Leg Stand 17.5 seconds             Nutrition:  Nutrition  Therapy & Goals - 02/03/21 1003       Nutrition Therapy   RD appointment deferred Yes   Aaron [name] is not interested in talking with RD at this time - he did want handout - heart healthy guidelines provided            Goals reviewed with patient; copy given to patient.

## 2021-04-18 ENCOUNTER — Encounter: Payer: Medicare HMO | Admitting: *Deleted

## 2021-04-18 ENCOUNTER — Other Ambulatory Visit: Payer: Self-pay

## 2021-04-18 DIAGNOSIS — Z955 Presence of coronary angioplasty implant and graft: Secondary | ICD-10-CM

## 2021-04-18 DIAGNOSIS — I252 Old myocardial infarction: Secondary | ICD-10-CM | POA: Diagnosis not present

## 2021-04-18 DIAGNOSIS — Z48812 Encounter for surgical aftercare following surgery on the circulatory system: Secondary | ICD-10-CM | POA: Diagnosis not present

## 2021-04-18 DIAGNOSIS — I214 Non-ST elevation (NSTEMI) myocardial infarction: Secondary | ICD-10-CM

## 2021-04-18 NOTE — Progress Notes (Signed)
Cardiac Individual Treatment Plan  Patient Details  Name: Aaron [name]. MRN: [id number] Date of Birth: Apr 24, 1946 Referring Provider:   Flowsheet Row Cardiac Rehab from 01/15/2021 in [facility] Cardiac and Pulmonary Rehab  Referring Provider Serafina Royals MD       Initial Encounter Date:  Flowsheet Row Cardiac Rehab from 01/15/2021 in [facility] Cardiac and Pulmonary Rehab  Date 01/15/21       Visit Diagnosis: NSTEMI (non-ST elevated myocardial infarction) [facility])  Status post coronary artery stent placement  Patient's Home Medications on Admission:  Current Outpatient Medications:    amLODipine (NORVASC) 5 MG tablet, Take by mouth., Disp: , Rfl:    aspirin 81 MG chewable tablet, Chew 1 tablet (81 mg total) by mouth daily., Disp:  , Rfl:    carvedilol (COREG) 3.125 MG tablet, Take 1 tablet (3.125 mg total) by mouth 2 (two) times daily with a meal., Disp: 60 tablet, Rfl: 0   cholecalciferol (VITAMIN D3) 25 MCG (1000 UNIT) tablet, Take 1,000 Units by mouth daily., Disp: , Rfl:    Coenzyme Q10 100 MG capsule, Take by mouth., Disp: , Rfl:    ezetimibe (ZETIA) 10 MG tablet, Take 1 tablet (10 mg total) by mouth daily., Disp: 30 tablet, Rfl: 0   ferrous sulfate 325 (65 FE) MG tablet, Take by mouth., Disp: , Rfl:    glipiZIDE (GLUCOTROL XL) 10 MG 24 hr tablet, , Disp: , Rfl:    glucosamine-chondroitin 500-400 MG tablet, Take 1 tablet by mouth 3 (three) times daily., Disp: , Rfl:    ketoconazole (NIZORAL) 2 % shampoo, Apply 1 application topically 2 (two) times a week., Disp: , Rfl: 3   MAGNESIUM OXIDE PO, Take by mouth., Disp: , Rfl:    MANGANESE PO, Take by mouth., Disp: , Rfl:    Multiple Vitamin (MULTIVITAMIN PO), Take by mouth., Disp: , Rfl:    multivitamin-lutein (OCUVITE-LUTEIN) CAPS capsule, Take 1 capsule by mouth daily., Disp: , Rfl:    nitroGLYCERIN (NITROSTAT) 0.4 MG SL tablet, Take 0.4 mg by mouth every 5 (five) minutes as needed for chest pain. , Disp: , Rfl:    Omega-3 Fatty  Acids (FISH OIL) 1200 MG CAPS, Take 2,400 mg by mouth daily. , Disp: , Rfl:    pantoprazole (PROTONIX) 40 MG tablet, Take 1 tablet (40 mg total) by mouth 2 (two) times daily before a meal., Disp: 60 tablet, Rfl: 0   phytonadione (VITAMIN K) 2 MG/ML SOLN oral solution, Take by mouth., Disp: , Rfl:    PRALUENT 150 MG/ML SOAJ, SMARTSIG:150 Milligram(s) SUB-Q Every 2 Weeks, Disp: , Rfl:    ticagrelor (BRILINTA) 90 MG TABS tablet, Take 1 tablet (90 mg total) by mouth 2 (two) times daily., Disp: 60 tablet, Rfl: 0   traMADol (ULTRAM) 50 MG tablet, Take 50 mg by mouth every 6 (six) hours as needed for moderate pain., Disp: , Rfl:    vitamin B-12 (CYANOCOBALAMIN) 1000 MCG tablet, Take 1,000 mcg by mouth daily., Disp: , Rfl:   Past Medical History: Past Medical History:  Diagnosis Date   Cellulitis    Diabetes mellitus without complication (HCC)    MI, old     Tobacco Use: Social History   Tobacco Use  Smoking Status Former  Smokeless Tobacco Former  Tobacco Comments   7/20 quit 35 years ago    Labs: Recent Review Flowsheet Data     Labs for ITP Cardiac and Pulmonary Rehab Latest Ref Rng & Units 09/13/2014 06/12/2019 06/13/2019 12/20/2020 12/21/2020  Cholestrol 0 - 200 mg/dL - - 148 - 136   LDLCALC 0 - 99 mg/dL - - UNABLE TO CALCULATE IF TRIGLYCERIDE OVER 400 mg/dL - UNABLE TO CALCULATE IF TRIGLYCERIDE OVER 400 mg/dL   LDLDIRECT 0 - 99 mg/dL - - 65.4 - 49.6   HDL >40 mg/dL - - 25(L) - 24(L)   Trlycerides <150 mg/dL - - 505(H) - 529(H)   Hemoglobin A1c 4.8 - 5.6 % 6.1(H) 10.6(H) 9.6(H) 8.1(H) -        Exercise Target Goals: Exercise Program Goal: Individual exercise prescription set using results from initial 6 min walk test and THRR while considering  patient's activity barriers and safety.   Exercise Prescription Goal: Initial exercise prescription builds to 30-45 minutes a day of aerobic activity, 2-3 days per week.  Home exercise guidelines will be given to patient during program  as part of exercise prescription that the participant will acknowledge.   Education: Aerobic Exercise: - Group verbal and visual presentation on the components of exercise prescription. Introduces F.I.T.T principle from ACSM for exercise prescriptions.  Reviews F.I.T.T. principles of aerobic exercise including progression. Written material given at graduation. Flowsheet Row Cardiac Rehab from 04/02/2021 in [facility] Cardiac and Pulmonary Rehab  Date 02/12/21  Educator [facility]  Instruction Review Code 1- Verbalizes Understanding       Education: Resistance Exercise: - Group verbal and visual presentation on the components of exercise prescription. Introduces F.I.T.T principle from ACSM for exercise prescriptions  Reviews F.I.T.T. principles of resistance exercise including progression. Written material given at graduation.    Education: Exercise & Equipment Safety: - Individual verbal instruction and demonstration of equipment use and safety with use of the equipment. Flowsheet Row Cardiac Rehab from 04/02/2021 in [facility] Cardiac and Pulmonary Rehab  Date 01/15/21  Educator [facility]  Instruction Review Code 1- Verbalizes Understanding       Education: Exercise Physiology & General Exercise Guidelines: - Group verbal and written instruction with models to review the exercise physiology of the cardiovascular system and associated critical values. Provides general exercise guidelines with specific guidelines to those with heart or lung disease.  Flowsheet Row Cardiac Rehab from 04/02/2021 in [facility] Cardiac and Pulmonary Rehab  Date 02/05/21  Educator [facility]  Instruction Review Code 1- Verbalizes Understanding       Education: Flexibility, Balance, Mind/Body Relaxation: - Group verbal and visual presentation with interactive activity on the components of exercise prescription. Introduces F.I.T.T principle from ACSM for exercise prescriptions. Reviews F.I.T.T. principles of flexibility and balance exercise  training including progression. Also discusses the mind body connection.  Reviews various relaxation techniques to help reduce and manage stress (i.e. Deep breathing, progressive muscle relaxation, and visualization). Balance handout provided to take home. Written material given at graduation.   Activity Barriers & Risk Stratification:  Activity Barriers & Cardiac Risk Stratification - 01/15/21 1142       Activity Barriers & Cardiac Risk Stratification   Activity Barriers Deconditioning;Muscular Weakness;History of Falls;Joint Problems;Other (comment);Left Knee Replacement;Shortness of Breath;Back Problems    Comments bilateral shoulder surgeries, hips hurt with walking    Cardiac Risk Stratification High             6 Minute Walk:  6 Minute Walk     Row Name 01/15/21 1141 04/16/21 1200       6 Minute Walk   Phase Initial Discharge    Distance 895 feet 995 feet    Distance % Change -- 11.1 %    Distance Feet Change --  100 ft    Walk Time 6 minutes 6 minutes    # of Rest Breaks 0 0    MPH 1.69 1.89    METS 2.34 2.09    RPE 13 13    Perceived Dyspnea  1 2    VO2 Peak 8.18 7.3    Symptoms Yes (comment) Yes (comment)    Comments hip pain 3/10, chest pain 2/10, SOB chest pain 4/10, SOB    Resting HR 82 bpm 66 bpm    Resting BP 124/66 122/68    Resting Oxygen Saturation  98 % --    Exercise Oxygen Saturation  during 6 min walk 96 % --    Max Ex. HR 122 bpm 115 bpm    Max Ex. BP 196/74 158/76    2 Minute Post BP 148/70 --             Oxygen Initial Assessment:   Oxygen Re-Evaluation:   Oxygen Discharge (Final Oxygen Re-Evaluation):   Initial Exercise Prescription:  Initial Exercise Prescription - 01/15/21 1100       Date of Initial Exercise RX and Referring Provider   Date 01/15/21    Referring Provider Serafina Royals MD      Treadmill   MPH 1.5    Grade 0.5    Minutes 15    METs 2.25      Recumbant Elliptical   Level 1    RPM 50    Minutes 15     METs 2      REL-XR   Level 1    Speed 50    Minutes 15    METs 2      Prescription Details   Frequency (times per week) 3    Duration Progress to 30 minutes of continuous aerobic without signs/symptoms of physical distress      Intensity   THRR 40-80% of Max Heartrate 107-132    Ratings of Perceived Exertion 11-13    Perceived Dyspnea 0-4      Progression   Progression Continue to progress workloads to maintain intensity without signs/symptoms of physical distress.      Resistance Training   Training Prescription Yes    Weight 4 lb    Reps 10-15             Perform Capillary Blood Glucose checks as needed.  Exercise Prescription Changes:   Exercise Prescription Changes     Row Name 01/15/21 1100 01/22/21 1300 02/05/21 1000 02/17/21 1100 03/03/21 1500     Response to Exercise   Blood Pressure (Admit) 124/66 130/62 124/70 122/76 122/60   Blood Pressure (Exercise) 196/74  rck 148/70 150/70 110/80 124/74 122/72   Blood Pressure (Exit) 132/66 126/62 124/62 104/68 122/64   Heart Rate (Admit) 82 bpm 62 bpm 81 bpm 92 bpm 104 bpm   Heart Rate (Exercise) 122 bpm 110 bpm 107 bpm 113 bpm 123 bpm   Heart Rate (Exit) 83 bpm 71 bpm 82 bpm 68 bpm 96 bpm   Oxygen Saturation (Admit) 98 % -- -- -- --   Oxygen Saturation (Exercise) 96 % -- -- -- --   Rating of Perceived Exertion (Exercise) _0 Perceived Dyspnea (Exercise) 1 -- -- -- --   Symptoms hip pain 3/10, chest pain 2/10, SOB hip pain walking none none weak   Comments walk test results -- -- -- --   Duration -- Progress to 30 minutes of  aerobic without signs/symptoms of physical  distress Progress to 30 minutes of  aerobic without signs/symptoms of physical distress Progress to 30 minutes of  aerobic without signs/symptoms of physical distress Continue with 30 min of aerobic exercise without signs/symptoms of physical distress.   Intensity -- THRR unchanged THRR unchanged THRR unchanged THRR unchanged      Progression   Progression -- Continue to progress workloads to maintain intensity without signs/symptoms of physical distress. Continue to progress workloads to maintain intensity without signs/symptoms of physical distress. Continue to progress workloads to maintain intensity without signs/symptoms of physical distress. Continue to progress workloads to maintain intensity without signs/symptoms of physical distress.   Average METs -- 2.7 2.33 2.5 1.96     Resistance Training   Training Prescription -- Yes Yes Yes Yes   Weight -- 4 lb 4 lb 4 lb 4 lb   Reps -- 10-15 10-15 10-15 10-15     Interval Training   Interval Training -- -- No No No     Treadmill   MPH -- 0.7 1.1 -- --   Grade -- -- 0 -- --   Minutes -- 15 15 -- --   METs -- -- 1.8 -- --     Recumbant Bike   Level -- -- 2 -- --   RPM -- -- 60 -- --   Watts -- -- 25 -- --   Minutes -- -- 15 -- --   METs -- -- 2.71 -- --     NuStep   Level -- -- 1 -- 3   Minutes -- -- 15 -- 15   METs -- -- 2.4 -- 2.1     Arm Ergometer   Level -- -- -- 1 --   Minutes -- -- -- 15 --   METs -- -- -- 2 --     Recumbant Elliptical   Level -- -- 1 -- --   Minutes -- -- 15 -- --   METs -- -- 1.4 -- --     REL-XR   Level -- 1 -- -- --   Speed -- 50 -- -- --   Minutes -- 15 -- -- --   METs -- 2.7 -- -- --     Track   Laps -- -- -- 25 15   Minutes -- -- -- 15 15   METs -- -- -- 2.36 1.82    Row Name 03/07/21 1000 03/17/21 1100 03/31/21 1100 04/14/21 1500       Response to Exercise   Blood Pressure (Admit) -- 122/64 116/62 138/64    Blood Pressure (Exit) -- 110/62 112/58 128/60    Heart Rate (Admit) -- 82 bpm 83 bpm 83 bpm    Heart Rate (Exercise) -- 113 bpm 109 bpm 118 bpm    Heart Rate (Exit) -- 83 bpm 87 bpm 87 bpm    Rating of Perceived Exertion (Exercise) -- _0 Symptoms -- -- none none    Duration -- Continue with 30 min of aerobic exercise without signs/symptoms of physical distress. Continue with 30 min of  aerobic exercise without signs/symptoms of physical distress. Continue with 30 min of aerobic exercise without signs/symptoms of physical distress.    Intensity -- THRR unchanged THRR unchanged THRR unchanged      Progression   Progression -- Continue to progress workloads to maintain intensity without signs/symptoms of physical distress. Continue to progress workloads to maintain intensity without signs/symptoms of physical distress. Continue to progress workloads to maintain intensity without signs/symptoms  of physical distress.    Average METs -- 2.8 2.25 1.5      Resistance Training   Training Prescription -- Yes Yes Yes    Weight -- 4 lb 5 lb 5 lb    Reps -- 10-15 10-15 10-15      Interval Training   Interval Training -- No No No      NuStep   Level -- -- 4 --    Minutes -- -- 15 --    METs -- -- 3.1 --      Arm Ergometer   Level -- 1 1.5 1.5    Minutes -- 15 15 --    METs -- _0 Track   Laps -- _1 Minutes -- _2 METs -- 2.36 1.98 2.09      Home Exercise Plan   Plans to continue exercise at Home (comment)  walking, staff videos Home (comment)  walking, staff videos Home (comment)  walking, staff videos Home (comment)  walking, staff videos    Frequency Add 2 additional days to program exercise sessions. Add 2 additional days to program exercise sessions. Add 2 additional days to program exercise sessions. Add 2 additional days to program exercise sessions.    Initial Home Exercises Provided 03/07/21 03/07/21 03/07/21 03/07/21             Exercise Comments:   Exercise Comments     Row Name 01/27/21 1000 04/09/21 1000         Exercise Comments [employer]. did not complete his rehab session.  Blood sugar is 31m/DL He has asked his provider for a  meter like the Freestyle.   He was advised to call his provider and let them know he does not have a working meter and his blood sugar was elevated this AM.  He normally has been arrive  with blood sugar in 200 range. Pt unable to exercise today due to blood glucose of 367. Patient stated he took his medication today. Patient was educated to check his blood sugar again later today and fasting tomorrow morning. Patient stated understanding and had no further questions.               Exercise Goals and Review:   Exercise Goals     Row Name 01/15/21 1145             Exercise Goals   Increase Physical Activity Yes       Intervention Provide advice, education, support and counseling about physical activity/exercise needs.;Develop an individualized exercise prescription for aerobic and resistive training based on initial evaluation findings, risk stratification, comorbidities and participant's personal goals.       Expected Outcomes Short Term: Attend rehab on a regular basis to increase amount of physical activity.;Long Term: Add in home exercise to make exercise part of routine and to increase amount of physical activity.;Long Term: Exercising regularly at least 3-5 days a week.       Increase Strength and Stamina Yes       Intervention Provide advice, education, support and counseling about physical activity/exercise needs.;Develop an individualized exercise prescription for aerobic and resistive training based on initial evaluation findings, risk stratification, comorbidities and participant's personal goals.       Expected Outcomes Short Term: Increase workloads from initial exercise prescription for resistance, speed, and METs.;Short Term: Perform resistance training exercises routinely during rehab and add in  resistance training at home;Long Term: Improve cardiorespiratory fitness, muscular endurance and strength as measured by increased METs and functional capacity (6MWT)       Able to understand and use rate of perceived exertion (RPE) scale Yes       Intervention Provide education and explanation on how to use RPE scale       Expected Outcomes Short Term: Able to use  RPE daily in rehab to express subjective intensity level;Long Term:  Able to use RPE to guide intensity level when exercising independently       Able to understand and use Dyspnea scale Yes       Intervention Provide education and explanation on how to use Dyspnea scale       Expected Outcomes Short Term: Able to use Dyspnea scale daily in rehab to express subjective sense of shortness of breath during exertion;Long Term: Able to use Dyspnea scale to guide intensity level when exercising independently       Knowledge and understanding of Target Heart Rate Range (THRR) Yes       Intervention Provide education and explanation of THRR including how the numbers were predicted and where they are located for reference       Expected Outcomes Short Term: Able to state/look up THRR;Long Term: Able to use THRR to govern intensity when exercising independently;Short Term: Able to use daily as guideline for intensity in rehab       Able to check pulse independently Yes       Intervention Provide education and demonstration on how to check pulse in carotid and radial arteries.;Review the importance of being able to check your own pulse for safety during independent exercise       Expected Outcomes Short Term: Able to explain why pulse checking is important during independent exercise;Long Term: Able to check pulse independently and accurately       Understanding of Exercise Prescription Yes       Intervention Provide education, explanation, and written materials on patient's individual exercise prescription       Expected Outcomes Short Term: Able to explain program exercise prescription;Long Term: Able to explain home exercise prescription to exercise independently                Exercise Goals Re-Evaluation :  Exercise Goals Re-Evaluation     Row Name 01/22/21 1308 02/05/21 1037 02/17/21 1141 03/03/21 1538 03/07/21 1027     Exercise Goal Re-Evaluation   Exercise Goals Review Increase Physical  Activity;Increase Strength and Stamina;Able to understand and use rate of perceived exertion (RPE) scale Increase Physical Activity;Understanding of Exercise Prescription;Increase Strength and Stamina Increase Physical Activity;Increase Strength and Stamina Increase Physical Activity;Increase Strength and Stamina;Understanding of Exercise Prescription Increase Physical Activity;Increase Strength and Stamina;Understanding of Exercise Prescription;Able to understand and use rate of perceived exertion (RPE) scale;Able to understand and use Dyspnea scale;Knowledge and understanding of Target Heart Rate Range (THRR);Able to check pulse independently   Comments First full day of exercise!  Patient was oriented to gym and equipment including functions, settings, policies, and procedures.  Patient's individual exercise prescription and treatment plan were reviewed.  All starting workloads were established based on the results of the 6 minute walk test done at initial orientation visit.  The plan for exercise progression was also introduced and progression will be customized based on patient's performance and goals. Patrik is tolerating exercise well the first couple sessions that he has been here. RPEs have been appropriate so far. We will continue  to monitor his progression as he tries new machines. Peighton has done well walking on the track.  He has worked up to 27 laps!  Staff will monitor progress. Maggie returned today after been out since 8/24.  He has been out with a UTI that spread.  He walk still feeling weak today and took things slow.  He was happy to be return to rehab. Reviewed home exercise with pt today.  Pt plans to walk and use staff videos at home for exercise.  Reviewed THR, pulse, RPE, sign and symptoms, pulse oximetery and when to call 911 or MD.  Also discussed weather considerations and indoor options.  Pt voiced understanding.   Expected Outcomes Short: Use RPE daily to regulate intensity. Long:  Follow program prescription in THR. Short: Work on increasing loads as tolerated. Long: Continue to increase overall MET level Short:  continue to build stamina walking Long:  improve MET level Short: Rebuild stamina back up Long; Continue to improve stamina. Short: Start to add in more walking at home Long: Continue to improve stamina.    Charlo Name 03/10/21 2035 03/17/21 1110 03/31/21 1125 04/14/21 1000       Exercise Goal Re-Evaluation   Exercise Goals Review Increase Physical Activity;Increase Strength and Stamina;Understanding of Exercise Prescription Increase Physical Activity;Increase Strength and Stamina Increase Physical Activity;Increase Strength and Stamina Increase Physical Activity;Increase Strength and Stamina    Comments Joselito is doing well in rehab. We reviewed home exercise last week.  He is trying to walk some on his off days.  He is starting to regain his strength.  He was feeling better before he got sick and is starting to bounce back again. Estes continues to do well.  He is up to 25 laps on the track.  He reaches his THR range while walking. Vansh continues to do well in rehab. He has increased his handweights to 5 lbs and 1.5 level on the arm crank. He continues to reach his THR during exercise, mostly when on the track. Will continue to monitor. Shem will continue to be active at home upon graduation from cardiac rehab program .    Expected Outcomes Short: Start to add in more walking at home Long: Continue to improve stamina. Short: continue to work in [employer] range Long: improve overall MET level Short: Continue to build up laps on track as tolerated Long: Continue to exercise overall MET level Continue with independent exercise program at home upon graduation.             Discharge Exercise Prescription (Final Exercise Prescription Changes):  Exercise Prescription Changes - 04/14/21 1500       Response to Exercise   Blood Pressure (Admit) 138/64    Blood Pressure  (Exit) 128/60    Heart Rate (Admit) 83 bpm    Heart Rate (Exercise) 118 bpm    Heart Rate (Exit) 87 bpm    Rating of Perceived Exertion (Exercise) 13    Symptoms none    Duration Continue with 30 min of aerobic exercise without signs/symptoms of physical distress.    Intensity THRR unchanged      Progression   Progression Continue to progress workloads to maintain intensity without signs/symptoms of physical distress.    Average METs 1.5      Resistance Training   Training Prescription Yes    Weight 5 lb    Reps 10-15      Interval Training   Interval Training No      Arm  Ergometer   Level 1.5    METs 1      Track   Laps 20    Minutes 15    METs 2.09      Home Exercise Plan   Plans to continue exercise at Home (comment)   walking, staff videos   Frequency Add 2 additional days to program exercise sessions.    Initial Home Exercises Provided 03/07/21             Nutrition:  Target Goals: Understanding of nutrition guidelines, daily intake of sodium <1562m, cholesterol <2060m calories 30% from fat and 7% or less from saturated fats, daily to have 5 or more servings of fruits and vegetables.  Education: All About Nutrition: -Group instruction provided by verbal, written material, interactive activities, discussions, models, and posters to present general guidelines for heart healthy nutrition including fat, fiber, MyPlate, the role of sodium in heart healthy nutrition, utilization of the nutrition label, and utilization of this knowledge for meal planning. Follow up email sent as well. Written material given at graduation. [facility]  Education need identified 01/15/21  Date 03/12/21  Educator [place]ruction Review Code 1- Verbalizes Understanding       Biometrics:  Pre Biometrics - 01/15/21 1145       Pre Biometrics   Height 5' 11.6" (1.819 m)    Weight 244 lb 12.8 oz (111 kg)    BMI  (Calculated) 33.56    Single Leg Stand 30 seconds             Post Biometrics - 04/16/21 1201        Post  Biometrics   Height 5' 11.6" (1.819 m)    Weight 245 lb 4.8 oz (111.3 kg)    BMI (Calculated) 33.63    Single Leg Stand 17.5 seconds             Nutrition Therapy Plan and Nutrition Goals:  Nutrition Therapy & Goals - 02/03/21 1003       Nutrition Therapy   RD appointment deferred Yes   VeSies not interested in talking with RD at this time - he did want handout - heart healthy guidelines provided            Nutrition Assessments:  MEDIFICTS Score Key: ?70 Need to make dietary changes  40-70 Heart Healthy Diet ? 40 Therapeutic Level Cholesterol Diet  [facility]  Picture Your Plate Total Score on Discharge 49      Picture Your Plate Scores: <4<29nhealthy dietary pattern with much room for improvement. 41-50 Dietary pattern unlikely to meet recommendations for good health and room for improvement. 51-60 More healthful dietary pattern, with some room for improvement.  >60 Healthy dietary pattern, although there may be some specific behaviors that could be improved.    Nutrition Goals Re-Evaluation:  Nutrition Goals Re-Evaluation     Ro[place]oyame 03/10/21 1007 04/14/21 1005           Goals   Nutrition Goal Heart Healthy --      Comment VeLynnae Sandhoffeclined meeting with dietician.  He tries to eat a balanced meal but not interested in making changes. Coston declined meeting with dieticain thoughout his time in cardiac rehab. He will be graduating this week from cardiac rehab.      Expected Outcome Focus on heart healthy eating Upon graduation VeMeliton Rattanill continue  to focus on healthy eating habbits.               Nutrition Goals Discharge (Final Nutrition Goals Re-Evaluation):  Nutrition Goals Re-Evaluation - 04/14/21 1005       Goals   Comment Celeste declined meeting with  dieticain thoughout his time in cardiac rehab. He will be graduating this week from cardiac rehab.    Expected Outcome Upon graduation Meliton Rattan will continue to focus on healthy eating habbits.             Psychosocial: Target Goals: Acknowledge presence or absence of significant depression and/or stress, maximize coping skills, provide positive support system. Participant is able to verbalize types and ability to use techniques and skills needed for reducing stress and depression.   Education: Stress, Anxiety, and Depression - Group verbal and visual presentation to define topics covered.  Reviews how body is impacted by stress, anxiety, and depression.  Also discusses healthy ways to reduce stress and to treat/manage anxiety and depression.  Written material given at graduation. Flowsheet Row Cardiac Rehab from 04/02/2021 in [facility] Cardiac and Pulmonary Rehab  Date 04/02/21  Educator [facility]  Instruction Review Code 1- Verbalizes Understanding       Education: Sleep Hygiene -Provides group verbal and written instruction about how sleep can affect your health.  Define sleep hygiene, discuss sleep cycles and impact of sleep habits. Review good sleep hygiene tips.    Initial Review & Psychosocial Screening:  Initial Psych Review & Screening - 01/08/21 1416       Initial Review   Current issues with Current Sleep Concerns;Current Stress Concerns    Source of Stress Concerns Chronic Illness;Financial    Comments if he naps during day he has a hard time getting to sleep, finances and health are stable but not great      Family Dynamics   Good Support System? Yes   daughter calls to check in, son lives in [place] but rest live nearby     Barriers   Psychosocial barriers to participate in program The patient should benefit from training in stress management and relaxation.;Psychosocial barriers identified (see note)      Screening Interventions   Interventions Encouraged to exercise;Provide  feedback about the scores to participant;To provide support and resources with identified psychosocial needs    Expected Outcomes Short Term goal: Utilizing psychosocial counselor, staff and physician to assist with identification of specific Stressors or current issues interfering with healing process. Setting desired goal for each stressor or current issue identified.;Long Term Goal: Stressors or current issues are controlled or eliminated.;Long Term goal: The participant improves quality of Life and PHQ9 Scores as seen by post scores and/or verbalization of changes;Short Term goal: Identification and review with participant of any Quality of Life or Depression concerns found by scoring the questionnaire.             Quality of Life Scores:   Quality of Life - 04/18/21 0954       Quality of Life   Select Quality of Life      Quality of Life Scores   Health/Function Pre 19.29 %    Health/Function Post 13.25 %    Health/Function % Change -31.31 %    Socioeconomic Pre 28.29 %    Socioeconomic Post 16.42 %    Socioeconomic % Change  -41.96 %    Psych/Spiritual Pre 22.29 %    Psych/Spiritual Post 18.57 %    Psych/Spiritual % Change -16.69 %  Family Pre 25.5 %    Family Post 23.25 %    Family % Change -8.82 %    GLOBAL Pre 22.69 %    GLOBAL Post 15.88 %    GLOBAL % Change -30.01 %            Scores of 19 and below usually indicate a poorer quality of life in these areas.  A difference of  2-3 points is a clinically meaningful difference.  A difference of 2-3 points in the total score of the Quality of Life Index has been associated with significant improvement in overall quality of life, self-image, physical symptoms, and general health in studies assessing change in quality of life.  PHQ-9: Recent Review Flowsheet Data     Depression screen [facility] 2/9 04/18/2021 01/15/2021   Decreased Interest 1 1   Down, Depressed, Hopeless 1 0   PHQ - 2 Score 2 1   Altered sleeping 1 0    Tired, decreased energy 3 3   Change in appetite 1 0   Feeling bad or failure about yourself  0 0   Trouble concentrating 0 0   Moving slowly or fidgety/restless 0 0   Suicidal thoughts 0 0   PHQ-9 Score 7 4   Difficult doing work/chores Somewhat difficult Very difficult      Interpretation of Total Score  Total Score Depression Severity:  1-4 = Minimal depression, 5-9 = Mild depression, 10-14 = Moderate depression, 15-19 = Moderately severe depression, 20-27 = Severe depression   Psychosocial Evaluation and Intervention:  Psychosocial Evaluation - 01/08/21 1420       Psychosocial Evaluation & Interventions   Interventions Stress management education;Encouraged to exercise with the program and follow exercise prescription    Comments Mr. 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  He has never done rehab before with his prior events but really wants to get moving again and feel better overall.  He wants to rebuild his strength to be able to do what he would like at home.  He denies any depression or anxiety.  He usually sleeps well at night and gets about 8hrs a night.  If he naps, he has a harder time getting to sleep at night.  He has a great support system as his son and daughter both live within 500 ft and check in often.  His son is currently in [place] by his wife checks in on him.  He also has a history of diabetes and kidney disease.  His kidneys are doing well and he is hoping to get a FreeStyle meter from his doctor to make checking his sugars easier.    Expected Outcomes Short: Start rehab to build strength Long: Attend regularly to build up stamina.    Continue Psychosocial Services  Follow up required by staff             Psychosocial Re-Evaluation:  Psychosocial Re-Evaluation     [place] Name 03/10/21 0959 04/14/21 1003           Psychosocial Re-Evaluation   Current issues with Current Stress Concerns;Current Sleep Concerns Current Stress  Concerns;Current Sleep Concerns      Comments Allah is doing well in rehab.  His "can do is not keeping up with his want to" which he finds frustrating. He tries not to let things get to him too much.  He is sleeping pretty good when he is able to sleep. He does nap some during the day  which can disrupt his going to sleep at night. No new concerns metions by patient. Javares will be graduating this week from cardiac rehab.      Expected Outcomes Short: Continue to work on sleeping Long: Continue to focus on positive Short: Coninue to work on healthy sleeping pattern. Long: Maintain good metal health habits.      Interventions Encouraged to attend Cardiac Rehabilitation for the exercise;Stress management education --      Continue Psychosocial Services  Follow up required by staff No Follow up required               Psychosocial Discharge (Final Psychosocial Re-Evaluation):  Psychosocial Re-Evaluation - 04/14/21 1003       Psychosocial Re-Evaluation   Current issues with Current Stress Concerns;Current Sleep Concerns    Comments No new concerns metions by patient. Derec will be graduating this week from cardiac rehab.    Expected Outcomes Short: Coninue to work on healthy sleeping pattern. Long: Maintain good metal health habits.    Continue Psychosocial Services  No Follow up required             Vocational Rehabilitation: Provide vocational rehab assistance to qualifying candidates.   Vocational Rehab Evaluation & Intervention:   Education: Education Goals: Education classes will be provided on a variety of topics geared toward better understanding of heart health and risk factor modification. Participant will state understanding/return demonstration of topics presented as noted by education test scores.  Learning Barriers/Preferences:  Learning Barriers/Preferences - 01/08/21 1414       Learning Barriers/Preferences   Learning Barriers Sight   glasses   Learning  Preferences None             General Cardiac Education Topics:  AED/CPR: - Group verbal and written instruction with the use of models to demonstrate the basic use of the AED with the basic ABC's of resuscitation.   Anatomy and Cardiac Procedures: - Group verbal and visual presentation and models provide information about basic cardiac anatomy and function. Reviews the testing methods done to diagnose heart disease and the outcomes of the test results. Describes the treatment choices: Medical Management, Angioplasty, or Coronary Bypass Surgery for treating various heart conditions including Myocardial Infarction, Angina, Valve Disease, and Cardiac Arrhythmias.  Written material given at graduation. Flowsheet Row Cardiac Rehab from 04/02/2021 in [facility] Cardiac and Pulmonary Rehab  Education need identified 01/15/21       Medication Safety: - Group verbal and visual instruction to review commonly prescribed medications for heart and lung disease. Reviews the medication, class of the drug, and side effects. Includes the steps to properly store meds and maintain the prescription regimen.  Written material given at graduation. Flowsheet Row Cardiac Rehab from 04/02/2021 in [facility] Cardiac and Pulmonary Rehab  Date 03/05/21  Educator SB  Instruction Review Code 1- Verbalizes Understanding       Intimacy: - Group verbal instruction through game format to discuss how heart and lung disease can affect sexual intimacy. Written material given at graduation.. Flowsheet Row Cardiac Rehab from 04/02/2021 in [facility] Cardiac and Pulmonary Rehab  Date 02/12/21  Educator [facility]  Instruction Review Code 1- Verbalizes Understanding       Know Your Numbers and Heart Failure: - Group verbal and visual instruction to discuss disease risk factors for cardiac and pulmonary disease and treatment options.  Reviews associated critical values for Overweight/Obesity, Hypertension, Cholesterol, and Diabetes.   Discusses basics of heart failure: signs/symptoms and treatments.  Introduces Heart Failure  Zone chart for action plan for heart failure.  Written material given at graduation.   Infection Prevention: - Provides verbal and written material to individual with discussion of infection control including proper hand washing and proper equipment cleaning during exercise session. Flowsheet Row Cardiac Rehab from 04/02/2021 in [facility] Cardiac and Pulmonary Rehab  Date 01/15/21  Educator [facility]  Instruction Review Code 1- Verbalizes Understanding       Falls Prevention: - Provides verbal and written material to individual with discussion of falls prevention and safety. Flowsheet Row Cardiac Rehab from 04/02/2021 in [facility] Cardiac and Pulmonary Rehab  Date 01/15/21  Educator [facility]  Instruction Review Code 1- Verbalizes Understanding       Other: -Provides group and verbal instruction on various topics (see comments)   Knowledge Questionnaire Score:  Knowledge Questionnaire Score - 04/18/21 0955       Knowledge Questionnaire Score   Post Score 24/26             Core Components/Risk Factors/Patient Goals at Admission:  Personal Goals and Risk Factors at Admission - 01/15/21 1147       Core Components/Risk Factors/Patient Goals on Admission    Weight Management Yes;Weight Loss;Obesity    Intervention Weight Management: Develop a combined nutrition and exercise program designed to reach desired caloric intake, while maintaining appropriate intake of nutrient and fiber, sodium and fats, and appropriate energy expenditure required for the weight goal.;Weight Management: Provide education and appropriate resources to help participant work on and attain dietary goals.;Weight Management/Obesity: Establish reasonable short term and long term weight goals.;Obesity: Provide education and appropriate resources to help participant work on and attain dietary goals.    Admit Weight 244 lb 12.8 oz (111 kg)     Goal Weight: Short Term 240 lb (108.9 kg)    Goal Weight: Long Term 230 lb (104.3 kg)    Expected Outcomes Short Term: Continue to assess and modify interventions until short term weight is achieved;Long Term: Adherence to nutrition and physical activity/exercise program aimed toward attainment of established weight goal;Weight Loss: Understanding of general recommendations for a balanced deficit meal plan, which promotes 1-2 lb weight loss per week and includes a negative energy balance of [phone] kcal/d;Understanding of distribution of calorie intake throughout the day with the consumption of 4-5 meals/snacks;Understanding recommendations for meals to include 15-35% energy as protein, 25-35% energy from fat, 35-60% energy from carbohydrates, less than 279m of dietary cholesterol, 20-35 gm of total fiber daily    Diabetes Yes    Intervention Provide education about signs/symptoms and action to take for hypo/hyperglycemia.;Provide education about proper nutrition, including hydration, and aerobic/resistive exercise prescription along with prescribed medications to achieve blood glucose in normal ranges: Fasting glucose 65-99 mg/dL    Expected Outcomes Short Term: Participant verbalizes understanding of the signs/symptoms and immediate care of hyper/hypoglycemia, proper foot care and importance of medication, aerobic/resistive exercise and nutrition plan for blood glucose control.;Long Term: Attainment of HbA1C < 7%.    Heart Failure Yes    Intervention Provide a combined exercise and nutrition program that is supplemented with education, support and counseling about heart failure. Directed toward relieving symptoms such as shortness of breath, decreased exercise tolerance, and extremity edema.    Expected Outcomes Improve functional capacity of life;Short term: Attendance in program 2-3 days a week with increased exercise capacity. Reported lower sodium intake. Reported increased fruit and vegetable  intake. Reports medication compliance.;Short term: Daily weights obtained and reported for increase. Utilizing diuretic protocols set by physician.;Long  term: Adoption of self-care skills and reduction of barriers for early signs and symptoms recognition and intervention leading to self-care maintenance.    Hypertension Yes    Intervention Provide education on lifestyle modifcations including regular physical activity/exercise, weight management, moderate sodium restriction and increased consumption of fresh fruit, vegetables, and low fat dairy, alcohol moderation, and smoking cessation.;Monitor prescription use compliance.    Expected Outcomes Short Term: Continued assessment and intervention until BP is < 140/74m HG in hypertensive participants. < 130/887mHG in hypertensive participants with diabetes, heart failure or chronic kidney disease.;Long Term: Maintenance of blood pressure at goal levels.    Lipids Yes    Intervention Provide education and support for participant on nutrition & aerobic/resistive exercise along with prescribed medications to achieve LDL <7079mHDL >92m51m  Expected Outcomes Long Term: Cholesterol controlled with medications as prescribed, with individualized exercise RX and with personalized nutrition plan. Value goals: LDL < 70mg69mL > 40 mg.;Short Term: Participant states understanding of desired cholesterol values and is compliant with medications prescribed. Participant is following exercise prescription and nutrition guidelines.             Education:Diabetes - Individual verbal and written instruction to review signs/symptoms of diabetes, desired ranges of glucose level fasting, after meals and with exercise. Acknowledge that pre and post exercise glucose checks will be done for 3 sessions at entry of program. [facility] [facility]  Date 01/15/21  Educator JH  [facility] Cent[facility]ruction Review Code 1- Verbalizes  Understanding       Core Components/Risk Factors/Patient Goals Review:   Goals and Risk Factor Review     Row Name 03/10/21 1003 04/14/21 1007           Core Components/Risk Factors/Patient Goals Review   Personal Goals Review Weight Management/Obesity;Heart Failure;Hypertension;Diabetes;Lipids Weight Management/Obesity;Heart Failure;Hypertension;Diabetes;Lipids      Review VernoLeooing well in rehab.  He is losing some weight and wants to get down to 230 lb as he wants to try sky diving.  He is still recovering from his UTI and trying to bounce back.  He weighs daily and tries to stay on top of his heart failure and denies any symptoms. His blood pressures are doing well and he checks it occasion at home. He has been using a wrist monitor at home and was encouraged to bring it into class to check it.  His blood sugars are doing better around 160-170 mg/dl on average.  He is pleased with progress.  He now has a new meter. Patient will be graduating from cardiac rehab this week. Upon graduation he will continue to take all medications and see his doctors when scheduled to help continue to control risk factors.      Expected Outcomes Short: Start using new meter for sugar and bring in cuff for review Long: continue to work on weight loss. Continue to monitor cardiac risk factors.               Core Components/Risk Factors/Patient Goals at Discharge (Final Review):   Goals and Risk Factor Review - 04/14/21 1007       Core Components/Risk Factors/Patient Goals Review   Personal Goals Review Weight Management/Obesity;Heart Failure;Hypertension;Diabetes;Lipids    Review Patient will be graduating from cardiac rehab this week. Upon graduation he will continue to take all medications and see his doctors when scheduled to help continue to control risk factors.    Expected Outcomes Continue to  monitor cardiac risk factors.             ITP Comments:  ITP Comments     Row Name  01/08/21 1425 01/15/21 1140 01/27/21 1000 01/29/21 0725 02/26/21 0626   ITP Comments Completed virtual orientation today.  EP evaluation is scheduled for Wednesday 01/15/21 at 930am.  Documentation for diagnosis can be found in [facility] encounter 12/20/20. Completed 6MWT and gym orientation. Initial ITP created and sent for review to Dr. Emily Filbert, Medical Director. [place] did not complete his rehab session.  Blood sugar is 310m/DL He has asked his provider for a  meter like the Freestyle.   He was advised to call his provider and let them know he does not have a working meter and his blood sugar was elevated this AM.  He normally has been arrive with blood sugar in 200 range. 30 Day review completed. Medical Director ITP review done, changes made as directed, and signed approval by Medical Director. 30 Day review completed. Medical Director ITP review done, changes made as directed, and signed approval by Medical Director.    RVernonName 03/03/21 1537 03/26/21 0922 04/09/21 1000 04/18/21 0958     ITP Comments VLynnae Sandhoffreturned to rehab today.  He is feeling very weak and fatigued.  He has been out with a UTI that spead to his testicles.  He wanted to return to start to rebuild his stamina. 30 day review completed. ITP sent to Dr. MEmily Filbert Medical Director of Cardiac Rehab. Continue with ITP unless changes are made by physician. Pt unable to exercise today due to blood glucose of 367. Patient stated he took his medication today. Patient was educated to check his blood sugar again later today and fasting tomorrow morning. Patient stated understanding and had no further questions. VCatongraduated today from  rehab with 35 sessions completed.  Details of the patient's exercise prescription and what He needs to do in order to continue the prescription and progress were discussed with patient.  Patient was given a copy of prescription and goals.  Patient verbalized understanding.  Davelle plans to continue  to exercise by walking at home.             Comments: discharge ITP

## 2021-04-18 NOTE — Progress Notes (Signed)
Daily Session Note  Patient Details  Name: Eliazar Olivar. MRN: [id number] Date of Birth: 1945/08/19 Referring Provider:   Flowsheet Row Cardiac Rehab from 01/15/2021 in [facility] Cardiac and Pulmonary Rehab  Referring Provider Serafina Royals MD       Encounter Date: 04/18/2021  Check In:  Session Check In - 04/18/21 0952       Check-In   Supervising physician immediately available to respond to emergencies See telemetry face sheet for immediately available ER MD    Location [facility]-Cardiac & Pulmonary Rehab    Staff Present Renita Papa, RN BSN;Joseph Miner, RCP,RRT,BSRT;Jessica Chaparral, [place], RCEP, CCRP, CCET    Virtual Visit No    Medication changes reported     No    Fall or balance concerns reported    No    Warm-up and Cool-down Performed on first and last piece of equipment    Resistance Training Performed Yes    VAD Patient? No    PAD/SET Patient? No      Pain Assessment   Currently in Pain? No/denies                Social History   Tobacco Use  Smoking Status Former  Smokeless Tobacco Former  Tobacco Comments   7/20 quit 35 years ago    Goals Met:  Independence with exercise equipment Exercise tolerated well No report of concerns or symptoms today Strength training completed today  Goals Unmet:  Not Applicable  Comments:  Rmani graduated today from  rehab with 35 sessions completed.  Details of the patient's exercise prescription and what He needs to do in order to continue the prescription and progress were discussed with patient.  Patient was given a copy of prescription and goals.  Patient verbalized understanding.  Herberth plans to continue to exercise by walking at home.     Dr. Emily Filbert is Medical Director for [place].  Dr. Ottie Glazier is Medical Director for [facility] Pulmonary Rehabilitation.

## 2021-04-18 NOTE — Progress Notes (Signed)
Discharge Progress Report  Patient Details  Name: Aaron [name]. MRN: [id number] Date of Birth: Oct 26, 1945 Referring Provider:   Flowsheet Row Cardiac Rehab from 01/15/2021 in [facility]  Referring Provider Serafina Royals MD        Number of Visits: 43  Reason for Discharge:  Patient reached a stable level of exercise. Patient independent in their exercise. Patient has met program and personal goals.  Smoking History:  Social History   Tobacco Use  Smoking Status Former  Smokeless Tobacco Former  Tobacco Comments   7/20 quit 35 years ago    Diagnosis:  NSTEMI (non-ST elevated myocardial infarction) ([place])  Status post coronary artery stent placement  ADL [facility]:   Initial Exercise Prescription:  Initial Exercise Prescription - 01/15/21 1100       Date of Initial Exercise RX and Referring Provider   Date 01/15/21    Referring Provider Serafina Royals MD      Treadmill   MPH 1.5    Grade 0.5    Minutes 15    METs 2.25      Recumbant Elliptical   Level 1    RPM 50    Minutes 15    METs 2      REL-XR   Level 1    Speed 50    Minutes 15    METs 2      Prescription Details   Frequency (times per week) 3    Duration Progress to 30 minutes of continuous aerobic without signs/symptoms of physical distress      Intensity   THRR 40-80% of Max Heartrate 107-132    Ratings of Perceived Exertion 11-13    Perceived Dyspnea 0-4      Progression   Progression Continue to progress workloads to maintain intensity without signs/symptoms of physical distress.      Resistance Training   Training Prescription Yes    Weight 4 lb    Reps 10-15             Discharge Exercise Prescription (Final Exercise Prescription Changes):  Exercise Prescription Changes - 04/14/21 1500       Response to Exercise   Blood Pressure (Admit) 138/64    Blood Pressure (Exit) 128/60    Heart Rate (Admit) 83 bpm    Heart Rate (Exercise) 118 bpm     Heart Rate (Exit) 87 bpm    Rating of Perceived Exertion (Exercise) 13    Symptoms none    Duration Continue with 30 min of aerobic exercise without signs/symptoms of physical distress.    Intensity THRR unchanged      Progression   Progression Continue to progress workloads to maintain intensity without signs/symptoms of physical distress.    Average METs 1.5      Resistance Training   Training Prescription Yes    Weight 5 lb    Reps 10-15      Interval Training   Interval Training No      Arm Ergometer   Level 1.5    METs 1      Track   Laps 20    Minutes 15    METs 2.09      Home Exercise Plan   Plans to continue exercise at Home (comment)   walking, staff videos   Frequency Add 2 additional days to program exercise sessions.    Initial Home Exercises Provided 03/07/21  Functional Capacity:  6 Minute Walk     Row Name 01/15/21 1141 04/16/21 1200       6 Minute Walk   Phase Initial Discharge    Distance 895 feet 995 feet    Distance % Change -- 11.1 %    Distance Feet Change -- 100 ft    Walk Time 6 minutes 6 minutes    # of Rest Breaks 0 0    MPH 1.69 1.89    METS 2.34 2.09    RPE 13 13    Perceived Dyspnea  1 2    VO2 Peak 8.18 7.3    Symptoms Yes (comment) Yes (comment)    Comments hip pain 3/10, chest pain 2/10, SOB chest pain 4/10, SOB    Resting HR 82 bpm 66 bpm    Resting BP 124/66 122/68    Resting Oxygen Saturation  98 % --    Exercise Oxygen Saturation  during 6 min walk 96 % --    Max Ex. HR 122 bpm 115 bpm    Max Ex. BP 196/74 158/76    2 Minute Post BP 148/70 --             Psychological, QOL, Others - Outcomes: PHQ 2/9: Depression screen [facility] 2/9 04/18/2021 01/15/2021  Decreased Interest 1 1  Down, Depressed, Hopeless 1 0  PHQ - 2 Score 2 1  Altered sleeping 1 0  Tired, decreased energy 3 3  Change in appetite 1 0  Feeling bad or failure about yourself  0 0  Trouble concentrating 0 0  Moving slowly or  fidgety/restless 0 0  Suicidal thoughts 0 0  PHQ-9 Score 7 4  Difficult doing work/chores Somewhat difficult Very difficult    Quality of Life:  Quality of Life - 04/18/21 0954       Quality of Life   Select Quality of Life      Quality of Life Scores   Health/Function Pre 19.29 %    Health/Function Post 13.25 %    Health/Function % Change -31.31 %    Socioeconomic Pre 28.29 %    Socioeconomic Post 16.42 %    Socioeconomic % Change  -41.96 %    Psych/Spiritual Pre 22.29 %    Psych/Spiritual Post 18.57 %    Psych/Spiritual % Change -16.69 %    Family Pre 25.5 %    Family Post 23.25 %    Family % Change -8.82 %    GLOBAL Pre 22.69 %    GLOBAL Post 15.88 %    GLOBAL % Change -30.01 %             Nutrition & Weight - Outcomes:  Pre Biometrics - 01/15/21 1145       Pre Biometrics   Height 5' 11.6" (1.819 m)    Weight 244 lb 12.8 oz (111 kg)    BMI (Calculated) 33.56    Single Leg Stand 30 seconds             Post Biometrics - 04/16/21 1201        Post  Biometrics   Height 5' 11.6" (1.819 m)    Weight 245 lb 4.8 oz (111.3 kg)    BMI (Calculated) 33.63    Single Leg Stand 17.5 seconds             Nutrition:  Nutrition Therapy & Goals - 02/03/21 1003       Nutrition Therapy   RD appointment deferred Yes  Loyed is not interested in talking with RD at this time - he did want handout - heart healthy guidelines provided             Education Questionnaire Score:  Knowledge Questionnaire Score - 04/18/21 0955       Knowledge Questionnaire Score   Post Score 24/26             Goals reviewed with patient; copy given to patient.

## 2021-05-14 ENCOUNTER — Encounter: Payer: Self-pay | Admitting: Pain Medicine

## 2021-05-14 ENCOUNTER — Other Ambulatory Visit: Payer: Self-pay

## 2021-05-14 ENCOUNTER — Ambulatory Visit: Payer: Medicare HMO | Attending: Pain Medicine | Admitting: Pain Medicine

## 2021-05-14 VITALS — BP 144/81 | HR 83 | Temp 97.2°F | Resp 18 | Ht 73.0 in | Wt 240.0 lb

## 2021-05-14 DIAGNOSIS — M5441 Lumbago with sciatica, right side: Secondary | ICD-10-CM | POA: Diagnosis not present

## 2021-05-14 DIAGNOSIS — Z7901 Long term (current) use of anticoagulants: Secondary | ICD-10-CM | POA: Diagnosis present

## 2021-05-14 DIAGNOSIS — M961 Postlaminectomy syndrome, not elsewhere classified: Secondary | ICD-10-CM | POA: Insufficient documentation

## 2021-05-14 DIAGNOSIS — M79604 Pain in right leg: Secondary | ICD-10-CM | POA: Diagnosis not present

## 2021-05-14 DIAGNOSIS — M5410 Radiculopathy, site unspecified: Secondary | ICD-10-CM | POA: Insufficient documentation

## 2021-05-14 DIAGNOSIS — M5136 Other intervertebral disc degeneration, lumbar region: Secondary | ICD-10-CM | POA: Diagnosis not present

## 2021-05-14 DIAGNOSIS — M5416 Radiculopathy, lumbar region: Secondary | ICD-10-CM | POA: Insufficient documentation

## 2021-05-14 DIAGNOSIS — M79605 Pain in left leg: Secondary | ICD-10-CM | POA: Diagnosis not present

## 2021-05-14 DIAGNOSIS — M25511 Pain in right shoulder: Secondary | ICD-10-CM | POA: Diagnosis not present

## 2021-05-14 DIAGNOSIS — E1142 Type 2 diabetes mellitus with diabetic polyneuropathy: Secondary | ICD-10-CM | POA: Insufficient documentation

## 2021-05-14 DIAGNOSIS — M5417 Radiculopathy, lumbosacral region: Secondary | ICD-10-CM | POA: Diagnosis not present

## 2021-05-14 DIAGNOSIS — Z79899 Other long term (current) drug therapy: Secondary | ICD-10-CM | POA: Diagnosis present

## 2021-05-14 DIAGNOSIS — G8929 Other chronic pain: Secondary | ICD-10-CM | POA: Insufficient documentation

## 2021-05-14 DIAGNOSIS — M899 Disorder of bone, unspecified: Secondary | ICD-10-CM | POA: Diagnosis present

## 2021-05-14 DIAGNOSIS — G894 Chronic pain syndrome: Secondary | ICD-10-CM | POA: Insufficient documentation

## 2021-05-14 DIAGNOSIS — Z79891 Long term (current) use of opiate analgesic: Secondary | ICD-10-CM | POA: Diagnosis present

## 2021-05-14 DIAGNOSIS — Z789 Other specified health status: Secondary | ICD-10-CM | POA: Diagnosis present

## 2021-05-14 DIAGNOSIS — M25551 Pain in right hip: Secondary | ICD-10-CM | POA: Insufficient documentation

## 2021-05-14 DIAGNOSIS — M25512 Pain in left shoulder: Secondary | ICD-10-CM | POA: Insufficient documentation

## 2021-05-14 NOTE — Progress Notes (Signed)
Safety precautions to be maintained throughout the outpatient stay will include: orient to surroundings, keep bed in low position, maintain call bell within reach at all times, provide assistance with transfer out of bed and ambulation.  

## 2021-05-14 NOTE — Progress Notes (Signed)
Patient: Aaron [name].  Service Category: E/M  Provider: Gaspar Cola, MD  DOB: [birth year]  DOS: 05/14/2021  Referring Provider: Fae Pippin  MRN: [id number]  Setting: Ambulatory outpatient  PCP: [name], [name]  Type: New Patient  Specialty: Interventional Pain Management    Location: Office  Delivery: Face-to-face     Primary Reason(s) for Visit: Encounter for initial evaluation of one or more chronic problems (new to examiner) potentially causing chronic pain, and posing a threat to normal musculoskeletal function. (Level of risk: High) CC: Back Pain (low)  HPI  Aaron [name] is a 75 y.o. year old, male patient, who comes for the first time to our practice referred by [name], [name] for our initial evaluation of his chronic pain. He has Unstable angina ([place]); CAD (coronary artery disease); Chest pain; CKD (chronic kidney disease), stage IIIa; NSTEMI (non-ST elevated myocardial infarction) ([place]); Dark stools; Type 2 diabetes mellitus ([place]); Hyperlipidemia, mixed; GERD (gastroesophageal reflux disease); Chronic systolic CHF (congestive heart failure) ([place]); Benign essential HTN; Bilateral carotid artery stenosis; Degenerative tear of glenoid labrum of left shoulder; Duodenal ulcer; Ischemic cardiomyopathy; Rotator cuff tendinitis, left; Rotator cuff tendinitis, right; Status post total left knee replacement; Tendinitis of upper biceps tendon of left shoulder; Traumatic incomplete tear of left rotator cuff; Hypercholesterolemia; Chronic pain syndrome; Pharmacologic therapy; Disorder of skeletal system; Problems influencing health status; Failed back surgical syndrome (x3); Chronic low back pain (1ry area of Pain) (Bilateral) (R>L) w/ sciatica (Right); Chronic lower extremity pain (2ry area of Pain) (Bilateral) (R>L); Chronic radicular pain of lower extremity (Right); Chronic lumbar radicular pain (Right); 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 Today he comes in for evaluation of his Back Pain (low)  Pain Assessment: Location: Lower Back Radiating: radiatesinto right hip down back of right leg to foot, left foot is numb Onset: More than a month ago Duration: Chronic pain Quality: Dull Severity: 2 /10 (subjective, self-reported pain score)  Effect on ADL: limits movement Timing: Constant Modifying factors: tramadol, lying in recliner sometimes helps BP: (!) 144/81  HR: 83  Onset and Duration: Started with accident Cause of pain: Work related accident or event Severity: Getting worse, NAS-11 at its worse: 8/10, NAS-11 at its best: 4/10, NAS-11 now: 6/10, and NAS-11 on the average: 9/10 Timing: Not influenced by the time of the day, During activity or exercise, After activity or exercise, and After a period of immobility Aggravating Factors: Bending, Kneeling, Lifiting, Prolonged sitting, Prolonged standing, Squatting, Stooping , Walking, Walking uphill, Walking downhill, and Working Alleviating Factors: Lying down, Medications, and Resting Associated Problems: Constipation, Day-time cramps, Inability to control bladder (urine), Swelling, Weakness, Pain that wakes patient up, and Pain that does not allow patient to sleep Quality of Pain: Aching, Agonizing, Annoying, Dull, Exhausting, Itching, Sharp, Shooting, Stabbing, and Uncomfortable Previous Examinations or Tests: X-rays Previous Treatments: Narcotic medications and TENS  According to the patient he has had problems with his lower back and lower extremities for more than 50 years.  His primary area of pain is that of the lower back (Bilateral) (R>L).  He admits to having had several back surgeries (2-3), the last of which was 40 to 50 years ago.  He denies any recent x-rays, physical therapy, or nerve blocks.  However, he does  indicate having had interventional therapies in the past which were done by a pain specialist  many years ago.  He was also put at the time on some methadone, which he indicates he does not like and he wants to stay away from that.  He states not being interested in any interventional therapies and currently being referred to [place] for medication management were he is currently taking tramadol 50 mg 1 tablet p.o. twice daily.  The patient's secondary area pain is that of the lower extremities (Right).  He describes having pain and numbness that go all the way down into the top of his foot and what appears to be an L5 dermatomal distribution.  He denies any recent surgery in that lower extremity, nerve blocks, recent x-rays, or physical therapy for that leg pain.  He also describes that it rather difficult to tell what is going on in the leg since he has bilateral diabetic peripheral neuropathy affecting both of his feet with chronic numbness.  The patient describes that the above are the primary areas of pain, but he also has a history of bilateral shoulder pain as well as knee problems.  Today I took the time to provide the patient with information regarding my pain practice. The patient was informed that my practice is divided into two sections: an interventional pain management section, as well as a completely separate and distinct medication management section. I explained that I have procedure days for my interventional therapies, and evaluation days for follow-ups and medication management. Because of the amount of documentation required during both, they are kept separated. This means that there is the possibility that he may be scheduled for a procedure on one day, and medication management the next. I have also informed him that because of staffing and facility limitations, I no longer take patients for medication management only. To illustrate the reasons for this, I gave the patient the example of  surgeons, and how inappropriate it would be to refer a patient to his/her care, just to write for the post-surgical antibiotics on a surgery done by a different surgeon.   Today I took the time to explain to the patient that the first visit is just on initial evaluation and depending on what is available, we may have to order some lab work and x-rays.  However I also pointed out to the patient that at this point we are very short one manpower and therefore I am currently not taking any patients for medication management only.  At this point he indicated that he has tried the interventional therapies before him and he is not interested in any, he only wants somebody to prescribe his tramadol 50 mg p.o. twice daily.  Based on the available information, the medication and the dose seem to be appropriate for the patient's condition.  Unfortunately, I do not have any space in my practice to take him just to write for his medicine.  The patient was not happy with this information and he indicated that he did not want to have any lab work or x-rays done and that he felt that this was a waste of his time.  I have put him in contact with the assistant director to the clinic so that he could voice his complaints.  I have also explained to the patient that he has the option of calling back in several months to see if we have already hired a Designer, jewellery or physician assistant to help [place] with that part of the practice.  Historic Controlled Substance Pharmacotherapy Review  PMP and historical list of  controlled substances: Tramadol 50 mg, 1 tab p.o. twice daily (last filled on 03/04/2021) from the PMP, it would seem that the patient is taking less than 1 tablet p.o. daily. Current opioid analgesics: .   Tramadol 50 mg, 1 tablet p.o. daily to twice daily MME/day: 5-10 mg/day   Historical Monitoring: The patient  reports no history of drug use. List of all UDS Test(s): Lab Results  Component Value Date   MDMA  NONE DETECTED 12/20/2020   MDMA NONE DETECTED 06/13/2019   COCAINSCRNUR NONE DETECTED 12/20/2020   COCAINSCRNUR NONE DETECTED 06/13/2019   PCPSCRNUR NONE DETECTED 12/20/2020   PCPSCRNUR NONE DETECTED 06/13/2019   THCU NONE DETECTED 12/20/2020   THCU NONE DETECTED 06/13/2019   List of other Serum/Urine Drug Screening Test(s):  Lab Results  Component Value Date   COCAINSCRNUR NONE DETECTED 12/20/2020   COCAINSCRNUR NONE DETECTED 06/13/2019   THCU NONE DETECTED 12/20/2020   THCU NONE DETECTED 06/13/2019   Historical Background Evaluation: [place] PMP: PDMP reviewed during this encounter. Online review of the past 13-monthperiod conducted.             PMP NARX Score Report:  Narcotic: 220 Sedative: 090 Stimulant: 000 [place] Department of public safety, offender search: (Editor, commissioningInformation) Non-contributory Risk Assessment Profile: Aberrant behavior: None observed or detected today Risk factors for fatal opioid overdose: None identified today PMP NARX Overdose Risk Score: 170 Fatal overdose hazard ratio (HR): Calculation deferred Non-fatal overdose hazard ratio (HR): Calculation deferred Risk of opioid abuse or dependence: 0.7-3.0% with doses ? 36 MME/day and 6.1-26% with doses ? 120 MME/day. Substance use disorder (SUD) risk level: See below Personal History of Substance Abuse (SUD-Substance use disorder):  Alcohol: Negative  Illegal Drugs: Negative  Rx Drugs: Negative  ORT Risk Level calculation: Low Risk  Opioid Risk Tool - 05/14/21 1026       Family History of Substance Abuse   Alcohol Negative    Illegal Drugs Negative    Rx Drugs Negative      Personal History of Substance Abuse   Alcohol Negative    Illegal Drugs Negative    Rx Drugs Negative      Age   Age between 136-45years  No      History of Preadolescent Sexual Abuse   History of Preadolescent Sexual Abuse Negative or Male      Psychological Disease   Psychological Disease Negative    Depression Negative       Total Score   Opioid Risk Tool Scoring 0    Opioid Risk Interpretation Low Risk            ORT Scoring interpretation table:  Score <3 = Low Risk for SUD  Score between 4-7 = Moderate Risk for SUD  Score >8 = High Risk for Opioid Abuse   PHQ-2 Depression Scale:  Total score: 0  PHQ-2 Scoring interpretation table: (Score and probability of major depressive disorder)  Score 0 = No depression  Score 1 = 15.4% Probability  Score 2 = 21.1% Probability  Score 3 = 38.4% Probability  Score 4 = 45.5% Probability  Score 5 = 56.4% Probability  Score 6 = 78.6% Probability   PHQ-9 Depression Scale:  Total score: 0  PHQ-9 Scoring interpretation table:  Score 0-4 = No depression  Score 5-9 = Mild depression  Score 10-14 = Moderate depression  Score 15-19 = Moderately severe depression  Score 20-27 = Severe depression (2.4 times higher risk of SUD and 2.89 times  higher risk of overuse)   Pharmacologic Plan: As per protocol, I have not taken over any controlled substance management, pending the results of ordered tests and/or consults.  At this point we also do not have enough manpower to take any patients for medication management only. Initial impression:  The patient appears to have appropriate indications for the use of the tramadol 50 mg p.o. twice daily, which are believed to be very reasonable dose for his condition.  Meds   Current Outpatient Medications:    amLODipine (NORVASC) 5 MG tablet, Take by mouth., Disp: , Rfl:    aspirin 81 MG chewable tablet, Chew 1 tablet (81 mg total) by mouth daily., Disp:  , Rfl:    carvedilol (COREG) 3.125 MG tablet, Take 1 tablet (3.125 mg total) by mouth 2 (two) times daily with a meal., Disp: 60 tablet, Rfl: 0   ezetimibe (ZETIA) 10 MG tablet, Take 1 tablet (10 mg total) by mouth daily., Disp: 30 tablet, Rfl: 0   glipiZIDE (GLUCOTROL) 10 MG tablet, Take 10 mg by mouth 2 (two) times daily before a meal., Disp: , Rfl:    nitroGLYCERIN  (NITROSTAT) 0.4 MG SL tablet, Take 0.4 mg by mouth every 5 (five) minutes as needed for chest pain. , Disp: , Rfl:    PRALUENT 150 MG/ML SOAJ, SMARTSIG:150 Milligram(s) SUB-Q Every 2 Weeks, Disp: , Rfl:    ticagrelor (BRILINTA) 90 MG TABS tablet, Take 1 tablet (90 mg total) by mouth 2 (two) times daily., Disp: 60 tablet, Rfl: 0   traMADol (ULTRAM) 50 MG tablet, Take 50 mg by mouth every 6 (six) hours as needed for moderate pain., Disp: , Rfl:    vitamin B-12 (CYANOCOBALAMIN) 1000 MCG tablet, Take 1,000 mcg by mouth daily., Disp: , Rfl:   Imaging Review  Shoulder Imaging: Shoulder-R MR wo contrast: Results for orders placed during the hospital encounter of 07/07/18 MR SHOULDER RIGHT WO CONTRAST  Narrative CLINICAL DATA:  Right shoulder pain for 3 weeks. The patient underwent right shoulder surgery in February 24, 2018. No known injury.  EXAM: MRI OF THE RIGHT SHOULDER WITHOUT CONTRAST  TECHNIQUE: Multiplanar, multisequence MR imaging of the shoulder was performed. No intravenous contrast was administered.  COMPARISON:  MRI right shoulder 01/27/2018  FINDINGS: Rotator cuff: Tracks in the humeral head for rotator cuff repair noted. The supraspinatus and infraspinatus tendons are thickened with markedly heterogeneous signal consistent with tendinopathy. No focal tear is identified. Small fissures in the mid supraspinatus just medial to the greater tuberosity are seen. No retracted tendon is identified.  Muscles:  Normal without atrophy or focal lesion.  Biceps long head: Status post tenodesis without evidence of complication.  Acromioclavicular Joint: Advanced degenerative disease is stable in appearance. Type 2 acromion. The patient is status post subacromial decompression. There is a small volume of fluid in the subacromial/subdeltoid bursa.  Glenohumeral Joint: Unremarkable.  Labrum: The superior and posterior, superior labrum have been debrided. No tear is  identified.  Bones:  No fracture or worrisome lesion.  Other: None.  IMPRESSION: Marked appearing supraspinatus and infraspinatus tendinopathy. A small fissure is seen in the mid supraspinatus just medial to the greater tuberosity but no full-thickness tear or tendon retraction is identified.  Status post subacromial decompression and biceps tenodesis without evidence of complication. Debridement of the superior and posterior, superior labrum also noted.  Small volume of subacromial/subdeltoid fluid compatible with bursitis.  Electronically Signed By: Inge Rise M.D. On: 07/07/2018 15:40  Shoulder-L MR wo contrast: Results  for orders placed during the hospital encounter of 09/12/19 MR SHOULDER LEFT WO CONTRAST  Narrative CLINICAL DATA:  Limited range of motion, left shoulder pain  EXAM: MRI OF THE LEFT SHOULDER WITHOUT CONTRAST  TECHNIQUE: Multiplanar, multisequence MR imaging of the shoulder was performed. No intravenous contrast was administered.  COMPARISON:  None.  FINDINGS: Rotator cuff: Severe tendinosis of the supraspinatus tendon with a small partial-thickness bursal surface tear and a small interstitial tear. Severe tendinosis of the infraspinatus tendon. Teres minor tendon is intact. Mild tendinosis of the inferior aspect of the subscapularis tendon.  Muscles: No atrophy or fatty replacement of nor abnormal signal within, the muscles of the rotator cuff.  Biceps long head: Severe tendinosis of the intra-articular portion of the long head of biceps tendon with a partial-thickness tear of the proximal extra-articular portion of the long head of the biceps tendon.  Acromioclavicular Joint: Moderate arthropathy of the acromioclavicular joint. Type I acromion. Moderate amount of subacromial/subdeltoid bursal fluid.  Glenohumeral Joint: No joint effusion. No chondral defect.  Labrum: Grossly intact, but evaluation is limited by lack  of intraarticular fluid.  Bones:  No acute osseous abnormality. No aggressive osseous lesion.  Other: No fluid collection or hematoma.  IMPRESSION: 1. Severe tendinosis of the supraspinatus tendon with a small partial-thickness bursal surface tear. 2. Severe tendinosis of the infraspinatus tendon. 3. Mild tendinosis of the inferior aspect of the subscapularis tendon. 4. Severe tendinosis of the intra-articular portion of the long head of biceps tendon with a partial-thickness tear of the proximal extra-articular portion of the long head of the biceps tendon.   Electronically Signed By: Kathreen Devoid On: 09/12/2019 16:03  Complexity Note: Imaging results reviewed. Results shared with Mr. Covin, using Layman's terms.                        ROS  Cardiovascular: High blood pressure and Blood thinners:  Antiplatelet Pulmonary or Respiratory: No reported pulmonary signs or symptoms such as wheezing and difficulty taking a deep full breath (Asthma), difficulty blowing air out (Emphysema), coughing up mucus (Bronchitis), persistent dry cough, or temporary stoppage of breathing during sleep Neurological: No reported neurological signs or symptoms such as seizures, abnormal skin sensations, urinary and/or fecal incontinence, being born with an abnormal open spine and/or a tethered spinal cord Psychological-Psychiatric: No reported psychological or psychiatric signs or symptoms such as difficulty sleeping, anxiety, depression, delusions or hallucinations (schizophrenial), mood swings (bipolar disorders) or suicidal ideations or attempts Gastrointestinal: No reported gastrointestinal signs or symptoms such as vomiting or evacuating blood, reflux, heartburn, alternating episodes of diarrhea and constipation, inflamed or scarred liver, or pancreas or irrregular and/or infrequent bowel movements Genitourinary: No reported renal or genitourinary signs or symptoms such as difficulty voiding or  producing urine, peeing blood, non-functioning kidney, kidney stones, difficulty emptying the bladder, difficulty controlling the flow of urine, or chronic kidney disease Hematological: No reported hematological signs or symptoms such as prolonged bleeding, low or poor functioning platelets, bruising or bleeding easily, hereditary bleeding problems, low energy levels due to low hemoglobin or being anemic Endocrine: High blood sugar requiring insulin (IDDM) and High blood sugar controlled without the use of insulin (NIDDM) Rheumatologic: No reported rheumatological signs and symptoms such as fatigue, joint pain, tenderness, swelling, redness, heat, stiffness, decreased range of motion, with or without associated rash Musculoskeletal: Negative for myasthenia gravis, muscular dystrophy, multiple sclerosis or malignant hyperthermia Work History: Retired  Allergies  Mr. Auman is allergic to ace  inhibitors, isosorbide, losartan, metoprolol tartrate, statins, and sulfa antibiotics.  Laboratory Chemistry Profile   Renal Lab Results  Component Value Date   BUN 20 12/21/2020   CREATININE 0.97 12/21/2020   GFRAA >60 06/14/2019   GFRNONAA >60 12/21/2020   PROTEINUR NEGATIVE 02/24/2015     Electrolytes Lab Results  Component Value Date   NA 135 12/21/2020   K 3.9 12/21/2020   CL 101 12/21/2020   CALCIUM 8.3 (L) 12/21/2020     Hepatic Lab Results  Component Value Date   AST 22 02/22/2018   ALT 23 02/22/2018   ALBUMIN 4.1 02/22/2018   ALKPHOS 75 02/22/2018     ID Lab Results  Component Value Date   SARSCOV2NAA NEGATIVE 12/20/2020     Bone No results found for: VD25OH, NO676HM0NOB, SJ6283MO2, HU7654YT0, 25OHVITD1, 25OHVITD2, 25OHVITD3, TESTOFREE, TESTOSTERONE   Endocrine Lab Results  Component Value Date   GLUCOSE 171 (H) 12/21/2020   GLUCOSEU NEGATIVE 02/24/2015   HGBA1C 8.1 (H) 12/20/2020     Neuropathy Lab Results  Component Value Date   HGBA1C 8.1 (H) 12/20/2020      CNS No results found for: COLORCSF, APPEARCSF, RBCCOUNTCSF, WBCCSF, POLYSCSF, LYMPHSCSF, EOSCSF, PROTEINCSF, GLUCCSF, JCVIRUS, CSFOLI, IGGCSF, LABACHR, ACETBL, LABACHR, ACETBL   Inflammation (CRP: Acute  ESR: Chronic) Lab Results  Component Value Date   ESRSEDRATE 2 09/12/2014     Rheumatology No results found for: RF, ANA, LABURIC, URICUR, LYMEIGGIGMAB, LYMEABIGMQN, HLAB27   Coagulation Lab Results  Component Value Date   INR 1.0 12/20/2020   LABPROT 13.0 12/20/2020   APTT 35 12/20/2020   PLT 156 12/21/2020   DDIMER 0.40 12/20/2020     Cardiovascular Lab Results  Component Value Date   BNP 23.6 12/20/2020   TROPONINI <0.03 02/24/2015   HGB 13.1 12/21/2020   HCT 37.8 (L) 12/21/2020     Screening Lab Results  Component Value Date   SARSCOV2NAA NEGATIVE 12/20/2020     Cancer No results found for: CEA, CA125, LABCA2   Allergens No results found for: ALMOND, APPLE, ASPARAGUS, AVOCADO, BANANA, BARLEY, BASIL, BAYLEAF, GREENBEAN, LIMABEAN, WHITEBEAN, BEEFIGE, REDBEET, BLUEBERRY, BROCCOLI, CABBAGE, MELON, CARROT, CASEIN, CASHEWNUT, CAULIFLOWER, CELERY     Note: Lab results reviewed.  PFSH  Drug: Mr. Bornemann  reports no history of drug use. Alcohol:  reports no history of alcohol use. Tobacco:  reports that he has quit smoking. He has quit using smokeless tobacco. Medical:  has a past medical history of Cellulitis, Diabetes mellitus without complication ([place]), and MI, old. Family: family history includes Angina in his mother.  Past Surgical History:  Procedure Laterality Date   ABDOMINAL SURGERY     APPENDECTOMY     CARDIAC CATHETERIZATION Left 10/21/2015   Procedure: Left Heart Cath and Coronary Angiography;  Surgeon: Corey Skains, MD;  Location: [facility];  Service: Cardiovascular;  Laterality: Left;   CARDIAC CATHETERIZATION N/A 10/21/2015   Procedure: Coronary Stent Intervention;  Surgeon: Isaias Cowman, MD;  Location: [facility];   Service: Cardiovascular;  Laterality: N/A;   CHOLECYSTECTOMY     CORONARY ANGIOPLASTY WITH STENT PLACEMENT     CORONARY STENT INTERVENTION N/A 06/13/2019   Procedure: CORONARY STENT INTERVENTION;  Surgeon: Isaias Cowman, MD;  Location: [facility];  Service: Cardiovascular;  Laterality: N/A;   CORONARY STENT INTERVENTION N/A 12/20/2020   Procedure: CORONARY STENT INTERVENTION;  Surgeon: Wellington Hampshire, MD;  Location: [facility];  Service: Cardiovascular;  Laterality: N/A;   LEFT HEART  CATH AND CORONARY ANGIOGRAPHY Left 10/07/2016   Procedure: Left Heart Cath and Coronary Angiography;  Surgeon: Corey Skains, MD;  Location: [facility];  Service: Cardiovascular;  Laterality: Left;   LEFT HEART CATH AND CORONARY ANGIOGRAPHY N/A 06/13/2019   Procedure: LEFT HEART CATH AND CORONARY ANGIOGRAPHY;  Surgeon: Corey Skains, MD;  Location: [facility];  Service: Cardiovascular;  Laterality: N/A;   LEFT HEART CATH AND CORONARY ANGIOGRAPHY N/A 12/20/2020   Procedure: LEFT HEART CATH AND CORONARY ANGIOGRAPHY;  Surgeon: Wellington Hampshire, MD;  Location: [facility];  Service: Cardiovascular;  Laterality: N/A;   SHOULDER ARTHROSCOPY WITH SUBACROMIAL DECOMPRESSION AND BICEP TENDON REPAIR Right 02/24/2018   Procedure: SHOULDER ARTHROSCOPY WITH SUBACROMIAL DEBRIDEMENT, DECOMPRESSION, ROTATOR CUFF REPAIR AND POSSIBLE BICEP TENODESIS;  Surgeon: Corky Mull, MD;  Location: [facility];  Service: Orthopedics;  Laterality: Right;   SHOULDER ARTHROSCOPY WITH SUBACROMIAL DECOMPRESSION AND BICEP TENDON REPAIR Left 04/25/2019   Procedure: SHOULDER ARTHROSCOPY WITH DEBRIDEMENT, DECOMPRESSION AND BICEP TENDON REPAIR, ROTATOR CUFF REPAIR;  Surgeon: Corky Mull, MD;  Location: [facility];  Service: Orthopedics;  Laterality: Left;   TONSILLECTOMY     Active Ambulatory Problems    Diagnosis Date Noted   Unstable angina ([place]) 10/21/2015   CAD (coronary artery disease)  09/19/2014   Chest pain 06/12/2019   CKD (chronic kidney disease), stage IIIa 06/12/2019   NSTEMI (non-ST elevated myocardial infarction) ([place]) 06/12/2019   Dark stools 06/12/2019   Type 2 diabetes mellitus ([place]) 06/12/2019   Hyperlipidemia, mixed 09/18/2014   GERD (gastroesophageal reflux disease) 09/38/1829   Chronic systolic CHF (congestive heart failure) ([place]) 12/20/2020   Benign essential HTN 09/19/2014   Bilateral carotid artery stenosis 09/30/2015   Degenerative tear of glenoid labrum of left shoulder 04/26/2019   Duodenal ulcer 05/06/2020   Ischemic cardiomyopathy 06/10/2020   Rotator cuff tendinitis, left 02/24/2019   Rotator cuff tendinitis, right 02/07/2018   Status post total left knee replacement 09/30/2015   Tendinitis of upper biceps tendon of left shoulder 04/19/2019   Traumatic incomplete tear of left rotator cuff 02/24/2019   Hypercholesterolemia 02/08/2011   Chronic pain syndrome 05/14/2021   Pharmacologic therapy 05/14/2021   Disorder of skeletal system 05/14/2021   Problems influencing health status 05/14/2021   Failed back surgical syndrome (x3) 05/14/2021   Chronic low back pain (1ry area of Pain) (Bilateral) (R>L) w/ sciatica (Right) 05/14/2021   Chronic lower extremity pain (2ry area of Pain) (Bilateral) (R>L) 05/14/2021   Chronic radicular pain of lower extremity (Right) 05/14/2021   Chronic lumbar radicular pain (Right) 05/14/2021   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 In no apparent acute distress Vitals:   05/14/21 1016  BP: (!) 144/81  Pulse: 83  Resp: 18  Temp: (!) 97.2 F (36.2 C)  SpO2: 100%  Weight: 240 lb (108.9 kg)  Height: 6' 1" (1.854 m)   BMI Assessment: Estimated body mass index is 31.66 kg/m as calculated from the following:  Height as of this encounter: 6' 1" (1.854 m).   Weight as of this encounter: 240 lb (108.9 kg).  BMI interpretation table: BMI level Category Range association with higher incidence of chronic pain  <18 kg/m2 Underweight   18.5-24.9 kg/m2 Ideal body weight   25-29.9 kg/m2 Overweight Increased incidence by 20%  30-34.9 kg/m2 Obese (Class I) Increased incidence by 68%  35-39.9 kg/m2 Severe obesity (Class II) Increased incidence by 136%  >40 kg/m2 Extreme obesity (Class III) Increased incidence by 254%   Patient's current BMI Ideal Body weight  Body mass index is 31.66 kg/m. Ideal body weight: 79.9 kg (176 lb 2.4 oz) Adjusted ideal body weight: 91.5 kg (201 lb 11 oz)   BMI Readings from Last 4 Encounters:  05/14/21 31.66 kg/m  04/16/21 33.64 kg/m  01/15/21 33.57 kg/m  12/21/20 32.86 kg/m   Wt Readings from Last 4 Encounters:  05/14/21 240 lb (108.9 kg)  04/16/21 245 lb 4.8 oz (111.3 kg)  01/15/21 244 lb 12.8 oz (111 kg)  12/21/20 242 lb 4.8 oz (109.9 kg)    Psych/Mental status: Alert, oriented x 3 (person, place, & time)       Eyes: PERLA Respiratory: No evidence of acute respiratory distress  Assessment  Primary Diagnosis & Pertinent Problem List: The primary encounter diagnosis was Chronic pain syndrome. Diagnoses of Chronic low back pain (1ry area of Pain) (Bilateral) (R>L) w/ sciatica (Right), Chronic lower extremity pain (2ry area of Pain) (Bilateral) (R>L), Failed back surgical syndrome (x3), DDD (degenerative disc disease), lumbar, Chronic radicular pain of lower extremity (Right), Chronic lumbar radicular pain (Right), Lumbosacral sensory radiculopathy  at L5 (Right), Chronic shoulder pain (Bilateral), Diabetic peripheral neuropathy ([place]), Pharmacologic therapy, Chronic anticoagulation (Brilinta), Disorder of skeletal system, Problems influencing health status, Chronic use of opiate for therapeutic purpose, and Encounter for chronic pain management were also pertinent to this visit.  Visit Diagnosis (New problems to examiner): 1. Chronic pain syndrome   2. Chronic low back pain (1ry area of Pain) (Bilateral) (R>L) w/ sciatica (Right)   3. Chronic lower extremity pain (2ry area of Pain) (Bilateral) (R>L)   4. Failed back surgical syndrome (x3)   5. DDD (degenerative disc disease), lumbar   6. Chronic radicular pain of lower extremity (Right)   7. Chronic lumbar radicular pain (Right)   8. 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 Chronic shoulder pain (Bilateral)   10. Diabetic peripheral neuropathy ([place])   11. Pharmacologic therapy   12. Chronic anticoagulation (Brilinta)   13. Disorder of skeletal system   14. Problems influencing health status   15. Chronic use of opiate for therapeutic purpose   16. Encounter for chronic pain management    Plan of Care (Initial workup plan)  Note: Mr. Harville has communicated to [place] that he is not interested in our services, at this time.  Problem-specific plan: No problem-specific Assessment & Plan notes found for this encounter.  Lab Orders  No laboratory test(s) ordered today   Imaging Orders  No imaging studies ordered today   Referral Orders  No referral(s) requested today   Procedure Orders    No procedure(s) ordered today   Pharmacotherapy (current): Medications ordered:  No orders of the defined types were placed in this encounter.  None  Medications administered during this visit: Verdell Face. Ortloff Jr. had no medications administered during this visit.   Pharmacological management options:  Opioid Analgesics: The patient appears to have appropriate indications  for the use of the  tramadol 50 mg p.o. twice daily.  The medication and the dose seems to be very reasonable for his condition.  However, at this point we do not have the manpower to take over his medication management.  Membrane stabilizer: I will not be taking over Mr. Glasheen medication regimen  Muscle relaxant: I will not be taking over Mr. Maltos medication regimen  NSAID: I will not be taking over Mr. Delapena medication regimen  Other analgesic(s): I will not be taking over Mr. Criado medication regimen   Interventional management options: Procedure(s) under consideration:  None.  The patient indicates not being interested in interventional therapies.     Interventional Therapies  Risk  Complexity Considerations:   Estimated body mass index is 31.66 kg/m as calculated from the following:   Height as of this encounter: 6' 1" (1.854 m).   Weight as of this encounter: 240 lb (108.9 kg). Brilinta anticoagulation: (Stop: 5-7 days  Restart: 6 hours) Coronary artery disease; congestive heart failure; history of MI; unstable angina; bilateral carotid artery stenosis; ischemic cardiomyopathy. Non-insulin-dependent diabetes mellitus with complications (chronic kidney disease; peripheral neuropathy). Chronic kidney disease stage IIIa   Planned  Pending:   Not interested in procedures at this time.   Under consideration:   None at this time.  The patient indicates having had procedures in the past and not having worked.   Completed:   None at this time   Therapeutic  Palliative (PRN) options:   None.    Provider-requested follow-up: Return if symptoms worsen or fail to improve.  No future appointments.   Note by: Gaspar Cola, MD Date: 05/14/2021; Time: 11:23 AM

## 2021-05-23 DIAGNOSIS — E118 Type 2 diabetes mellitus with unspecified complications: Secondary | ICD-10-CM | POA: Diagnosis not present

## 2021-05-23 DIAGNOSIS — E782 Mixed hyperlipidemia: Secondary | ICD-10-CM | POA: Diagnosis not present

## 2021-05-26 ENCOUNTER — Encounter: Payer: Self-pay | Admitting: Anesthesiology

## 2021-05-26 ENCOUNTER — Other Ambulatory Visit: Payer: Self-pay

## 2021-05-26 ENCOUNTER — Ambulatory Visit: Payer: Medicare HMO | Attending: Anesthesiology | Admitting: Anesthesiology

## 2021-05-26 VITALS — BP 119/77 | Temp 97.3°F | Resp 16 | Ht 72.0 in | Wt 240.0 lb

## 2021-05-26 DIAGNOSIS — M5410 Radiculopathy, site unspecified: Secondary | ICD-10-CM | POA: Diagnosis not present

## 2021-05-26 DIAGNOSIS — G894 Chronic pain syndrome: Secondary | ICD-10-CM | POA: Diagnosis not present

## 2021-05-26 DIAGNOSIS — G8929 Other chronic pain: Secondary | ICD-10-CM | POA: Insufficient documentation

## 2021-05-26 DIAGNOSIS — M25512 Pain in left shoulder: Secondary | ICD-10-CM | POA: Diagnosis not present

## 2021-05-26 DIAGNOSIS — Z79899 Other long term (current) drug therapy: Secondary | ICD-10-CM | POA: Insufficient documentation

## 2021-05-26 DIAGNOSIS — M5441 Lumbago with sciatica, right side: Secondary | ICD-10-CM | POA: Insufficient documentation

## 2021-05-26 DIAGNOSIS — E1142 Type 2 diabetes mellitus with diabetic polyneuropathy: Secondary | ICD-10-CM | POA: Diagnosis not present

## 2021-05-26 DIAGNOSIS — M25511 Pain in right shoulder: Secondary | ICD-10-CM | POA: Insufficient documentation

## 2021-05-26 DIAGNOSIS — M79604 Pain in right leg: Secondary | ICD-10-CM | POA: Diagnosis not present

## 2021-05-26 DIAGNOSIS — M961 Postlaminectomy syndrome, not elsewhere classified: Secondary | ICD-10-CM | POA: Diagnosis not present

## 2021-05-26 DIAGNOSIS — M79605 Pain in left leg: Secondary | ICD-10-CM | POA: Diagnosis not present

## 2021-05-26 DIAGNOSIS — M5136 Other intervertebral disc degeneration, lumbar region: Secondary | ICD-10-CM | POA: Diagnosis not present

## 2021-05-26 MED ORDER — TRAMADOL HCL 50 MG PO TABS
50.0000 mg | ORAL_TABLET | Freq: Four times a day (QID) | ORAL | 1 refills | Status: AC | PRN
Start: 2021-05-26 — End: 2021-06-25

## 2021-05-26 NOTE — Progress Notes (Signed)
Subjective:  Patient ID: Aaron Pain., male    DOB: June 12, 1946  Age: 75 y.o. MRN: [id number]  CC: Back Pain (lower)   Procedure: None  HPI Aaron Pain. presents for reevaluation.  He was initially seen by Dr. Laban Emperor.  I reviewed his chart and he continues to have complaints of persistent low back pain with radiation into the right lower extremity more so than left and right hip pain.  From his discussion and description today the quality characteristic and distribution of the pain remains similar to what he originally experienced with Dr. Laban Emperor.  He takes tramadol for pain relief and this generally works very well for him.  He states that when he is taking medication he gets 50 to 75% reduction and generally takes approximately 1 to 2 tablets a day but breakthrough at other points during the day secondary to restrictive practices and its distribution to him.  Otherwise his low back pain has been stable in nature and chronic.  It bothers him at night and when he is having pain he has to swing his right leg secondary to this discomfort.  He has difficulty sleeping if he is not taking the tramadol.  No side effects are reported with the tramadol.  Outpatient Medications Prior to Visit  Medication Sig Dispense Refill   amLODipine (NORVASC) 5 MG tablet Take by mouth.     aspirin 81 MG chewable tablet Chew 1 tablet (81 mg total) by mouth daily.     carvedilol (COREG) 3.125 MG tablet Take 1 tablet (3.125 mg total) by mouth 2 (two) times daily with a meal. 60 tablet 0   ezetimibe (ZETIA) 10 MG tablet Take 1 tablet (10 mg total) by mouth daily. 30 tablet 0   glipiZIDE (GLUCOTROL) 10 MG tablet Take 10 mg by mouth 2 (two) times daily before a meal.     nitroGLYCERIN (NITROSTAT) 0.4 MG SL tablet Take 0.4 mg by mouth every 5 (five) minutes as needed for chest pain.      PRALUENT 150 MG/ML SOAJ SMARTSIG:150 Milligram(s) SUB-Q Every 2 Weeks     ticagrelor (BRILINTA) 90 MG TABS tablet Take  1 tablet (90 mg total) by mouth 2 (two) times daily. 60 tablet 0   vitamin B-12 (CYANOCOBALAMIN) 1000 MCG tablet Take 1,000 mcg by mouth daily.     traMADol (ULTRAM) 50 MG tablet Take 50 mg by mouth every 6 (six) hours as needed for moderate pain.     No facility-administered medications prior to visit.    Review of Systems CNS: No confusion or sedation Cardiac: No angina or palpitations GI: No abdominal pain or constipation Constitutional: No nausea vomiting fevers or chills  Objective:  BP 119/77   Temp (!) 97.3 F (36.3 C) (Temporal)   Resp 16   Ht 6' (1.829 m)   Wt 240 lb (108.9 kg)   SpO2 98%   BMI 32.55 kg/m    BP Readings from Last 3 Encounters:  05/26/21 119/77  05/14/21 (!) 144/81  12/21/20 (!) 146/79     Wt Readings from Last 3 Encounters:  05/26/21 240 lb (108.9 kg)  05/14/21 240 lb (108.9 kg)  04/16/21 245 lb 4.8 oz (111.3 kg)     Physical Exam Pt is alert and oriented PERRL EOMI HEART IS RRR no murmur or rub LCTA no wheezing or rales MUSCULOSKELETAL reveals some paraspinous muscle tenderness in the lumbar region but no overt trigger points.  He has a well-healed midline scar and  across the bilateral sacral elements.  He ambulates with an antalgic gait.  His strength appears to be well preserved to the lower extremities.  Has difficulty going from seated to standing and difficulty with balance with an effort to stand on his toes.  He does this with difficulty.  Labs  Lab Results  Component Value Date   HGBA1C 8.1 (H) 12/20/2020   HGBA1C 9.6 (H) 06/13/2019   HGBA1C 10.6 (H) 06/12/2019   Lab Results  Component Value Date   LDLCALC UNABLE TO CALCULATE IF TRIGLYCERIDE OVER 400 mg/dL 81/77/1165   CREATININE 0.97 12/21/2020    -------------------------------------------------------------------------------------------------------------------- Lab Results  Component Value Date   WBC 11.0 (H) 12/21/2020   HGB 13.1 12/21/2020   HCT 37.8 (L)  12/21/2020   PLT 156 12/21/2020   GLUCOSE 171 (H) 12/21/2020   CHOL 136 12/21/2020   TRIG 529 (H) 12/21/2020   HDL 24 (L) 12/21/2020   LDLDIRECT 49.6 12/21/2020   LDLCALC UNABLE TO CALCULATE IF TRIGLYCERIDE OVER 400 mg/dL 79/08/8331   ALT 23 83/29/1916   AST 22 02/22/2018   NA 135 12/21/2020   K 3.9 12/21/2020   CL 101 12/21/2020   CREATININE 0.97 12/21/2020   BUN 20 12/21/2020   CO2 24 12/21/2020   INR 1.0 12/20/2020   HGBA1C 8.1 (H) 12/20/2020    --------------------------------------------------------------------------------------------------------------------- DG Chest 2 View  Result Date: 12/20/2020 CLINICAL DATA:  Acute chest pain EXAM: CHEST - 2 VIEW COMPARISON:  06/12/2019 FINDINGS: The cardiomediastinal silhouette is unremarkable. There is no evidence of focal airspace disease, pulmonary edema, suspicious pulmonary nodule/mass, pleural effusion, or pneumothorax. No acute bony abnormalities are identified. IMPRESSION: No active cardiopulmonary disease. Electronically Signed   By: Harmon Pier M.D.   On: 12/20/2020 08:30   CARDIAC CATHETERIZATION  Result Date: 12/20/2020  There is mild left ventricular systolic dysfunction.  LV end diastolic pressure is mildly elevated.  The left ventricular ejection fraction is 45-50% by visual estimate.  Mid LM to Dist LM lesion is 20% stenosed.  Ost LAD lesion is 30% stenosed.  Mid LAD lesion is 10% stenosed.  Mid LAD to Dist LAD lesion is 30% stenosed.  Prox Cx to Mid Cx lesion is 60% stenosed.  Mid Cx to Dist Cx lesion is 99% stenosed.  A drug-eluting stent was successfully placed using a STENT RESOLUTE ONYX [id number].  Post intervention, there is a 0% residual stenosis.  Post intervention, there is a 0% residual stenosis.  Prox LAD to Mid LAD lesion is 80% stenosed.  Post intervention, there is a 0% residual stenosis.  A drug-eluting stent was successfully placed using a STENT RESOLUTE ONYX 3.0X12.  Prox RCA lesion is 50% stenosed.   Prox RCA to Mid RCA lesion is 10% stenosed.  Previously placed Dist RCA stent (unknown type) is widely patent.  1.  Significant underlying three-vessel coronary artery disease with patent RCA stents with minimal restenosis.  There is moderate proximal RCA stenosis.  The mid LAD stent is patent but there is significant stenosis proximal to the stent.  In addition, there is severe mid left circumflex disease which seems to be the culprit for non-ST elevation myocardial infarction. 2.  Mildly reduced LV systolic function with mildly elevated left ventricular end-diastolic pressure. 3.  Successful angioplasty and drug-eluting stent placement to the left circumflex as well as LAD. Recommendations: Dual antiplatelet therapy for at least 12 years and consider longer given multiple stents. Aggressive treatment of risk factors.  Consider alternative hyperlipidemia therapy given intolerance to statins.  Will start small dose carvedilol for elevated blood pressure. The patient will be continued on nitroglycerin drip until his chest pain is resolved. Possible discharge home tomorrow if stable.     Assessment & Plan:   Aaron [name] was seen today for back pain.  Diagnoses and all orders for this visit:  Chronic pain syndrome  Chronic low back pain (1ry area of Pain) (Bilateral) (R>L) w/ sciatica (Right)  Chronic lower extremity pain (2ry area of Pain) (Bilateral) (R>L)  Failed back surgical syndrome (x3)  DDD (degenerative disc disease), lumbar  Chronic shoulder pain (Bilateral)  Diabetic peripheral neuropathy (HCC)  Pharmacologic therapy  Chronic radicular pain of lower extremity (Right)  Other orders -     traMADol (ULTRAM) 50 MG tablet; Take 1 tablet (50 mg total) by mouth every 6 (six) hours as needed for moderate pain.        ----------------------------------------------------------------------------------------------------------------------  Problem List Items Addressed This Visit        Unprioritized   Chronic low back pain (1ry area of Pain) (Bilateral) (R>L) w/ sciatica (Right) (Chronic)   Relevant Medications   traMADol (ULTRAM) 50 MG tablet   Chronic pain syndrome - Primary (Chronic)   Relevant Medications   traMADol (ULTRAM) 50 MG tablet   Chronic radicular pain of lower extremity (Right) (Chronic)   Relevant Medications   traMADol (ULTRAM) 50 MG tablet   Chronic shoulder pain (Bilateral) (Chronic)   Relevant Medications   traMADol (ULTRAM) 50 MG tablet   DDD (degenerative disc disease), lumbar (Chronic)   Relevant Medications   traMADol (ULTRAM) 50 MG tablet   Diabetic peripheral neuropathy (HCC) (Chronic)   Failed back surgical syndrome (x3) (Chronic)   Relevant Medications   traMADol (ULTRAM) 50 MG tablet   Pharmacologic therapy (Chronic)   Chronic right hip pain   Relevant Medications   traMADol (ULTRAM) 50 MG tablet      ----------------------------------------------------------------------------------------------------------------------  1. Chronic pain syndrome At this time at this time I think it is appropriate to continue with the tramadol as this has been effective for him and he has failed more conservative therapy.  He gets good relief with no side effects reported.  We also talked about opportunities to add in acetaminophen up to 1000 mg 3 times a day and application of the TENS unit with some stretching strengthening exercises reviewed.  2. Chronic low back pain (1ry area of Pain) (Bilateral) (R>L) w/ sciatica (Right)   3. Chronic lower extremity pain (2ry area of Pain) (Bilateral) (R>L)   4. Failed back surgical syndrome (x3)   5. DDD (degenerative disc disease), lumbar   6. Chronic shoulder pain (Bilateral)   7. Diabetic peripheral neuropathy (HCC)   8. Pharmacologic therapy   9. Chronic radicular pain of lower extremity  (Right)     ----------------------------------------------------------------------------------------------------------------------  I have changed Asa Saunas. Kilmartin Jr.'s traMADol. I am also having him maintain his nitroGLYCERIN, aspirin, ezetimibe, vitamin B-12, carvedilol, ticagrelor, amLODipine, Praluent, and glipiZIDE.   Meds ordered this encounter  Medications   traMADol (ULTRAM) 50 MG tablet    Sig: Take 1 tablet (50 mg total) by mouth every 6 (six) hours as needed for moderate pain.    Dispense:  120 tablet    Refill:  1   Patient's Medications  New Prescriptions   No medications on file  Previous Medications   AMLODIPINE (NORVASC) 5 MG TABLET    Take by mouth.   ASPIRIN 81 MG CHEWABLE TABLET    Chew 1 tablet (81 mg  total) by mouth daily.   CARVEDILOL (COREG) 3.125 MG TABLET    Take 1 tablet (3.125 mg total) by mouth 2 (two) times daily with a meal.   EZETIMIBE (ZETIA) 10 MG TABLET    Take 1 tablet (10 mg total) by mouth daily.   GLIPIZIDE (GLUCOTROL) 10 MG TABLET    Take 10 mg by mouth 2 (two) times daily before a meal.   NITROGLYCERIN (NITROSTAT) 0.4 MG SL TABLET    Take 0.4 mg by mouth every 5 (five) minutes as needed for chest pain.    PRALUENT 150 MG/ML SOAJ    SMARTSIG:150 Milligram(s) SUB-Q Every 2 Weeks   TICAGRELOR (BRILINTA) 90 MG TABS TABLET    Take 1 tablet (90 mg total) by mouth 2 (two) times daily.   VITAMIN B-12 (CYANOCOBALAMIN) 1000 MCG TABLET    Take 1,000 mcg by mouth daily.  Modified Medications   Modified Medication Previous Medication   TRAMADOL (ULTRAM) 50 MG TABLET traMADol (ULTRAM) 50 MG tablet      Take 1 tablet (50 mg total) by mouth every 6 (six) hours as needed for moderate pain.    Take 50 mg by mouth every 6 (six) hours as needed for moderate pain.  Discontinued Medications   No medications on file   ----------------------------------------------------------------------------------------------------------------------  Follow-up: Return in  about 2 months (around 07/27/2021) for evaluation, med refill.  Will schedule him for return to clinic in 2 months.  Continue follow-up for him is requested with his primary care physicians.  No other changes are made with his pharmacologic regimen today.  Yevette Edwards, MD

## 2021-05-26 NOTE — Progress Notes (Signed)
Safety precautions to be maintained throughout the outpatient stay will include: orient to surroundings, keep bed in low position, maintain call bell within reach at all times, provide assistance with transfer out of bed and ambulation.  

## 2021-05-27 DIAGNOSIS — I6523 Occlusion and stenosis of bilateral carotid arteries: Secondary | ICD-10-CM | POA: Diagnosis not present

## 2021-05-27 DIAGNOSIS — I25118 Atherosclerotic heart disease of native coronary artery with other forms of angina pectoris: Secondary | ICD-10-CM | POA: Diagnosis not present

## 2021-05-27 DIAGNOSIS — E118 Type 2 diabetes mellitus with unspecified complications: Secondary | ICD-10-CM | POA: Diagnosis not present

## 2021-05-27 DIAGNOSIS — E782 Mixed hyperlipidemia: Secondary | ICD-10-CM | POA: Diagnosis not present

## 2021-05-27 DIAGNOSIS — I255 Ischemic cardiomyopathy: Secondary | ICD-10-CM | POA: Diagnosis not present

## 2021-05-27 DIAGNOSIS — I10 Essential (primary) hypertension: Secondary | ICD-10-CM | POA: Diagnosis not present

## 2021-06-04 DIAGNOSIS — I10 Essential (primary) hypertension: Secondary | ICD-10-CM | POA: Diagnosis not present

## 2021-06-04 DIAGNOSIS — M545 Low back pain, unspecified: Secondary | ICD-10-CM | POA: Diagnosis not present

## 2021-06-04 DIAGNOSIS — N183 Chronic kidney disease, stage 3 unspecified: Secondary | ICD-10-CM | POA: Diagnosis not present

## 2021-06-04 DIAGNOSIS — Z6833 Body mass index (BMI) 33.0-33.9, adult: Secondary | ICD-10-CM | POA: Diagnosis not present

## 2021-06-04 DIAGNOSIS — I2510 Atherosclerotic heart disease of native coronary artery without angina pectoris: Secondary | ICD-10-CM | POA: Diagnosis not present

## 2021-06-04 DIAGNOSIS — E1129 Type 2 diabetes mellitus with other diabetic kidney complication: Secondary | ICD-10-CM | POA: Diagnosis not present

## 2021-06-04 DIAGNOSIS — Z23 Encounter for immunization: Secondary | ICD-10-CM | POA: Diagnosis not present

## 2021-06-04 DIAGNOSIS — M109 Gout, unspecified: Secondary | ICD-10-CM | POA: Diagnosis not present

## 2021-07-15 ENCOUNTER — Ambulatory Visit: Payer: Medicare HMO | Admitting: Anesthesiology

## 2021-07-15 ENCOUNTER — Other Ambulatory Visit: Payer: Self-pay

## 2021-07-15 MED ORDER — TRAMADOL HCL 50 MG PO TABS
50.0000 mg | ORAL_TABLET | Freq: Four times a day (QID) | ORAL | 2 refills | Status: AC | PRN
Start: 2021-07-15 — End: 2021-08-14

## 2021-07-24 ENCOUNTER — Ambulatory Visit: Payer: Medicare HMO | Attending: Anesthesiology | Admitting: Anesthesiology

## 2021-07-24 ENCOUNTER — Encounter: Payer: Self-pay | Admitting: Anesthesiology

## 2021-07-24 ENCOUNTER — Other Ambulatory Visit: Payer: Self-pay

## 2021-07-24 DIAGNOSIS — M79604 Pain in right leg: Secondary | ICD-10-CM

## 2021-07-24 DIAGNOSIS — M5441 Lumbago with sciatica, right side: Secondary | ICD-10-CM | POA: Diagnosis not present

## 2021-07-24 DIAGNOSIS — M5416 Radiculopathy, lumbar region: Secondary | ICD-10-CM

## 2021-07-24 DIAGNOSIS — M961 Postlaminectomy syndrome, not elsewhere classified: Secondary | ICD-10-CM

## 2021-07-24 DIAGNOSIS — G8929 Other chronic pain: Secondary | ICD-10-CM

## 2021-07-24 DIAGNOSIS — Z79899 Other long term (current) drug therapy: Secondary | ICD-10-CM

## 2021-07-24 DIAGNOSIS — M79605 Pain in left leg: Secondary | ICD-10-CM

## 2021-07-24 DIAGNOSIS — G894 Chronic pain syndrome: Secondary | ICD-10-CM

## 2021-07-24 DIAGNOSIS — M25512 Pain in left shoulder: Secondary | ICD-10-CM

## 2021-07-24 DIAGNOSIS — Z79891 Long term (current) use of opiate analgesic: Secondary | ICD-10-CM

## 2021-07-24 DIAGNOSIS — M5410 Radiculopathy, site unspecified: Secondary | ICD-10-CM

## 2021-07-24 DIAGNOSIS — M25511 Pain in right shoulder: Secondary | ICD-10-CM

## 2021-07-24 DIAGNOSIS — E1142 Type 2 diabetes mellitus with diabetic polyneuropathy: Secondary | ICD-10-CM

## 2021-07-24 DIAGNOSIS — M5136 Other intervertebral disc degeneration, lumbar region: Secondary | ICD-10-CM

## 2021-07-24 NOTE — Progress Notes (Signed)
Virtual Visit via Telephone Note  I connected with Aaron [name]. on 07/24/21 at  1:00 PM EST by telephone and verified that I am speaking with the correct person using two identifiers.  Location: Patient: Home Provider: [facility]   I discussed the limitations, risks, security and privacy concerns of performing an evaluation and management service by telephone and the availability of in person appointments. I also discussed with the patient that there may be a patient responsible charge related to this service. The patient expressed understanding and agreed to proceed.   History of Present Illness: I spoke to Aaron [name] via telephone as he was unable to do the video link for the virtual complex.  He reports that his back [name] has been stable and that he is doing reasonably well.  He is trying to stay active but this recent wet weather has caused some intermittent irritation but nothing that is out of the ordinary for him.  He takes his tramadol approximately 2-3 times a day on average sometimes 1/4 tablet if he is having an active day and this continues to work well for him with no side effects and improved function.  Otherwise is in his usual state of health and no new recent contributory changes.  Lower extremity strength and function bowel bladder function and stable.  Review of systems: General: No fevers or chills Pulmonary: No shortness of breath or dyspnea Cardiac: No angina or palpitations or lightheadedness GI: No abdominal [name] or constipation Psych: No depression    Observations/Objective:   Current Outpatient Medications:    amLODipine (NORVASC) 5 MG tablet, Take by mouth., Disp: , Rfl:    aspirin 81 MG chewable tablet, Chew 1 tablet (81 mg total) by mouth daily., Disp:  , Rfl:    carvedilol (COREG) 3.125 MG tablet, Take 1 tablet (3.125 mg total) by mouth 2 (two) times daily with a meal., Disp: 60 tablet, Rfl: 0   ezetimibe (ZETIA) 10 MG tablet, Take 1 tablet (10  mg total) by mouth daily., Disp: 30 tablet, Rfl: 0   glipiZIDE (GLUCOTROL) 10 MG tablet, Take 10 mg by mouth 2 (two) times daily before a meal., Disp: , Rfl:    nitroGLYCERIN (NITROSTAT) 0.4 MG SL tablet, Take 0.4 mg by mouth every 5 (five) minutes as needed for chest [name]. , Disp: , Rfl:    PRALUENT 150 MG/ML SOAJ, SMARTSIG:150 Milligram(s) SUB-Q Every 2 Weeks, Disp: , Rfl:    ticagrelor (BRILINTA) 90 MG TABS tablet, Take 1 tablet (90 mg total) by mouth 2 (two) times daily., Disp: 60 tablet, Rfl: 0   traMADol (ULTRAM) 50 MG tablet, Take 1 tablet (50 mg total) by mouth every 6 (six) hours as needed for moderate [name]., Disp: 120 tablet, Rfl: 2   vitamin B-12 (CYANOCOBALAMIN) 1000 MCG tablet, Take 1,000 mcg by mouth daily., Disp: , Rfl:    Past Medical History:  Diagnosis Date   Cellulitis    Diabetes mellitus without complication (HCC)    MI, old     Assessment and Plan:  1. Chronic [name] syndrome   2. 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 Chronic lower extremity [name] (2ry area of [name]) (Bilateral) (R>L)   4. Failed back surgical syndrome (x3)   5. DDD (degenerative disc disease), lumbar   6. Chronic shoulder [name] (Bilateral)   7. Diabetic peripheral neuropathy (HCC)   8. Pharmacologic therapy   9. Chronic radicular [name] of lower  extremity (Right)   10. Chronic lumbar radicular [name] (Right)   11. Chronic use of opiate for therapeutic purpose   Based on our discussion today and after review of the [facility] practitioner database information it is appropriate to refill his medicines for the next few months.  We will schedule him for 3 months return to clinic.  I encouraged him to continue to be active with the stretching strengthening and aerobic conditioning.  He is to follow-up with his primary care physician for baseline medical care with return to clinic as mentioned. Follow Up Instructions:    I discussed the assessment and treatment  plan with the patient. The patient was provided an opportunity to ask questions and all were answered. The patient agreed with the plan and demonstrated an understanding of the instructions.   The patient was advised to call back or seek an in-person evaluation if the symptoms worsen or if the condition fails to improve as anticipated.  I provided 30 minutes of non-face-to-face time during this encounter.   Yevette Edwards, MD

## 2021-10-14 ENCOUNTER — Ambulatory Visit: Payer: Medicare HMO | Attending: Anesthesiology | Admitting: Anesthesiology

## 2021-10-14 ENCOUNTER — Encounter: Payer: Self-pay | Admitting: Anesthesiology

## 2021-10-14 DIAGNOSIS — G8929 Other chronic pain: Secondary | ICD-10-CM

## 2021-10-14 DIAGNOSIS — M5441 Lumbago with sciatica, right side: Secondary | ICD-10-CM

## 2021-10-14 DIAGNOSIS — M79604 Pain in right leg: Secondary | ICD-10-CM

## 2021-10-14 DIAGNOSIS — M79605 Pain in left leg: Secondary | ICD-10-CM

## 2021-10-14 DIAGNOSIS — M25511 Pain in right shoulder: Secondary | ICD-10-CM

## 2021-10-14 DIAGNOSIS — G894 Chronic pain syndrome: Secondary | ICD-10-CM | POA: Diagnosis not present

## 2021-10-14 DIAGNOSIS — M5136 Other intervertebral disc degeneration, lumbar region: Secondary | ICD-10-CM

## 2021-10-14 DIAGNOSIS — M25512 Pain in left shoulder: Secondary | ICD-10-CM

## 2021-10-14 DIAGNOSIS — Z79891 Long term (current) use of opiate analgesic: Secondary | ICD-10-CM

## 2021-10-14 DIAGNOSIS — M961 Postlaminectomy syndrome, not elsewhere classified: Secondary | ICD-10-CM | POA: Diagnosis not present

## 2021-10-14 MED ORDER — TRAMADOL HCL 50 MG PO TABS: 50.0000 mg | ORAL_TABLET | ORAL | 2 refills | Status: AC | PRN

## 2021-10-19 ENCOUNTER — Encounter: Payer: Self-pay | Admitting: Anesthesiology

## 2021-10-19 NOTE — Progress Notes (Signed)
Virtual Visit via Telephone Note ? ?I connected with Aaron [name]. on 10/19/21 at  1:00 PM EDT by telephone and verified that I am speaking with the correct person using two identifiers. ? ?Location: ?Patient: Home ?Provider: [facility] ?  ?I discussed the limitations, risks, security and privacy concerns of performing an evaluation and management service by telephone and the availability of in person appointments. I also discussed with the patient that there may be a patient responsible charge related to this service. The patient expressed understanding and agreed to proceed. ? ? ?History of Present Illness: ?I spoke with Aaron [name] today via telephone as we were unable to link for the video portion of the conference and he reports that his [name] is doing reasonably well with her current regimen.  He is taking tramadol as prescribed and generally averaging about 5 tablets/day on average.  He generally spaces these out about every 4-6 hours but does have some breakthrough [name] at different points in the day depending on his level of activity.  Otherwise he is managing reasonably well and the tramadol is keeping his [name] under control.  No change in the quality characteristic or distribution of the [name] is noted at this time.  He is try to stay active with doing stretching strengthening exercises as tolerated. ? ?Review of systems: ?General: No fevers or chills ?Pulmonary: No shortness of breath or dyspnea ?Cardiac: No angina or palpitations or lightheadedness ?GI: No abdominal [name] or constipation ?Psych: No depression  ?  ?Observations/Objective: ? ?Current Outpatient Medications:  ?  amLODipine (NORVASC) 5 MG tablet, Take by mouth., Disp: , Rfl:  ?  aspirin 81 MG chewable tablet, Chew 1 tablet (81 mg total) by mouth daily., Disp:  , Rfl:  ?  carvedilol (COREG) 3.125 MG tablet, Take 1 tablet (3.125 mg total) by mouth 2 (two) times daily with a meal., Disp: 60 tablet, Rfl: 0 ?  ezetimibe (ZETIA) 10 MG  tablet, Take 1 tablet (10 mg total) by mouth daily., Disp: 30 tablet, Rfl: 0 ?  glipiZIDE (GLUCOTROL) 10 MG tablet, Take 10 mg by mouth 2 (two) times daily before a meal., Disp: , Rfl:  ?  nitroGLYCERIN (NITROSTAT) 0.4 MG SL tablet, Take 0.4 mg by mouth every 5 (five) minutes as needed for chest [name]. , Disp: , Rfl:  ?  PRALUENT 150 MG/ML SOAJ, SMARTSIG:150 Milligram(s) SUB-Q Every 2 Weeks, Disp: , Rfl:  ?  ticagrelor (BRILINTA) 90 MG TABS tablet, Take 1 tablet (90 mg total) by mouth 2 (two) times daily., Disp: 60 tablet, Rfl: 0 ?  traMADol (ULTRAM) 50 MG tablet, Take 1 tablet (50 mg total) by mouth every 4 (four) hours as needed., Disp: 150 tablet, Rfl: 2 ?  vitamin B-12 (CYANOCOBALAMIN) 1000 MCG tablet, Take 1,000 mcg by mouth daily., Disp: , Rfl:   ? ?Past Medical History:  ?Diagnosis Date  ? Cellulitis   ? Diabetes mellitus without complication (HCC)   ? MI, old   ?  ? ?Assessment and Plan: ?1. Chronic [name] syndrome   ?2. 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 Chronic lower extremity [name] (2ry area of [name]) (Bilateral) (R>L)   ?4. Failed back surgical syndrome (x3)   ?5. DDD (degenerative disc disease), lumbar   ?6. 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  I am going to allow him up to 5 tablets/day and he can use 2 at a time during peak activity which may be more beneficial for him.  He is having no problems with side effects with the medication management.  Continue exercises as reviewed today and continue follow-up with his primary care physician for baseline medical care with a scheduled appointment in 2 months. ? ?Follow Up Instructions: ? ?  ?I discussed the assessment and treatment plan with the patient. The patient was provided an opportunity to ask questions and all were  answered. The patient agreed with the plan and demonstrated an understanding of the instructions. ?  ?The patient was advised to call back or seek an in-person evaluation if the symptoms worsen or if the condition fails to improve as anticipated. ? ?I provided 30 minutes of non-face-to-face time during this encounter. ? ? ?Yevette Edwards, MD  ?

## 2021-12-09 ENCOUNTER — Telehealth: Payer: Medicare HMO | Admitting: Anesthesiology

## 2021-12-31 ENCOUNTER — Encounter: Payer: Self-pay | Admitting: Anesthesiology

## 2021-12-31 ENCOUNTER — Ambulatory Visit: Payer: Medicare HMO | Attending: Anesthesiology | Admitting: Anesthesiology

## 2021-12-31 DIAGNOSIS — M79604 Pain in right leg: Secondary | ICD-10-CM

## 2021-12-31 DIAGNOSIS — M5441 Lumbago with sciatica, right side: Secondary | ICD-10-CM | POA: Diagnosis not present

## 2021-12-31 DIAGNOSIS — M961 Postlaminectomy syndrome, not elsewhere classified: Secondary | ICD-10-CM | POA: Diagnosis not present

## 2021-12-31 DIAGNOSIS — M25511 Pain in right shoulder: Secondary | ICD-10-CM

## 2021-12-31 DIAGNOSIS — G8929 Other chronic pain: Secondary | ICD-10-CM

## 2021-12-31 DIAGNOSIS — M5136 Other intervertebral disc degeneration, lumbar region: Secondary | ICD-10-CM

## 2021-12-31 DIAGNOSIS — M79605 Pain in left leg: Secondary | ICD-10-CM

## 2021-12-31 DIAGNOSIS — Z79891 Long term (current) use of opiate analgesic: Secondary | ICD-10-CM

## 2021-12-31 DIAGNOSIS — E1142 Type 2 diabetes mellitus with diabetic polyneuropathy: Secondary | ICD-10-CM

## 2021-12-31 DIAGNOSIS — G894 Chronic pain syndrome: Secondary | ICD-10-CM | POA: Diagnosis not present

## 2021-12-31 DIAGNOSIS — M25512 Pain in left shoulder: Secondary | ICD-10-CM

## 2021-12-31 MED ORDER — TRAMADOL HCL 50 MG PO TABS: 100.0000 mg | ORAL_TABLET | Freq: Three times a day (TID) | ORAL | 2 refills | Status: AC

## 2021-12-31 NOTE — Progress Notes (Signed)
Virtual Visit via Telephone Note  I connected with Aaron [name]. on 12/31/21 at  9:30 AM EDT by telephone and verified that I am speaking with the correct person using two identifiers.  Location: Patient: Home Provider: [facility]   I discussed the limitations, risks, security and privacy concerns of performing an evaluation and management service by telephone and the availability of in person appointments. I also discussed with the patient that there may be a patient responsible charge related to this service. The patient expressed understanding and agreed to proceed.   History of Present Illness: I spoke with Aaron [name] today via telephone as we are unable link for the video portion of the conference but he appears to be doing quite well.  He staying active and this is helping to improve his shoulder [name] neck [name] and lower back [name].  He takes his tramadol as needed and generally averages about 2 tablets 3 times a day for 5 days of the week.  The medication continues to give him good relief and no side effects.  It enables him to stay functional active and sleep better at night.  No changes in the quality characteristic or distribution of his neck or shoulder [name] are mentioned.  No side effects with the medications either.  Review of systems: General: No fevers or chills Pulmonary: No shortness of breath or dyspnea Cardiac: No angina or palpitations or lightheadedness GI: No abdominal [name] or constipation Psych: No depression    Observations/Objective:  Current Outpatient Medications:    traMADol (ULTRAM) 50 MG tablet, Take 2 tablets (100 mg total) by mouth 3 (three) times daily., Disp: 180 tablet, Rfl: 2   amLODipine (NORVASC) 5 MG tablet, Take by mouth., Disp: , Rfl:    aspirin 81 MG chewable tablet, Chew 1 tablet (81 mg total) by mouth daily., Disp:  , Rfl:    carvedilol (COREG) 3.125 MG tablet, Take 1 tablet (3.125 mg total) by mouth 2 (two) times daily with  a meal., Disp: 60 tablet, Rfl: 0   ezetimibe (ZETIA) 10 MG tablet, Take 1 tablet (10 mg total) by mouth daily., Disp: 30 tablet, Rfl: 0   glipiZIDE (GLUCOTROL) 10 MG tablet, Take 10 mg by mouth 2 (two) times daily before a meal., Disp: , Rfl:    nitroGLYCERIN (NITROSTAT) 0.4 MG SL tablet, Take 0.4 mg by mouth every 5 (five) minutes as needed for chest [name]. , Disp: , Rfl:    PRALUENT 150 MG/ML SOAJ, SMARTSIG:150 Milligram(s) SUB-Q Every 2 Weeks, Disp: , Rfl:    ticagrelor (BRILINTA) 90 MG TABS tablet, Take 1 tablet (90 mg total) by mouth 2 (two) times daily., Disp: 60 tablet, Rfl: 0   vitamin B-12 (CYANOCOBALAMIN) 1000 MCG tablet, Take 1,000 mcg by mouth daily., Disp: , Rfl:    Past Medical History:  Diagnosis Date   Cellulitis    Diabetes mellitus without complication (HCC)    MI, old      Assessment and Plan:  1. Chronic [name] syndrome   2. 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 Chronic lower extremity [name] (2ry area of [name]) (Bilateral) (R>L)   4. Failed back surgical syndrome (x3)   5. DDD (degenerative disc disease), lumbar   6. 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 Diabetic peripheral neuropathy (HCC)   Based on our discussion today and upon review of the [place]  [place] practitioner database information I think it is appropriate to refill his medicines for the next 3 months.  This will be for tramadol 50 mg tablets 2 tablets 3 times a day as needed with 2 refills.  We will schedule him for 42-month return to clinic.  No other changes in his pharmacologic regimen are initiated today.  Continue stretching strengthening exercises as reviewed and continue follow-up with his primary care physicians for baseline medical care. Follow Up Instructions:    I discussed the assessment and treatment plan with the patient. The patient was provided an opportunity to ask questions and all were answered.  The patient agreed with the plan and demonstrated an understanding of the instructions.   The patient was advised to call back or seek an in-person evaluation if the symptoms worsen or if the condition fails to improve as anticipated.  I provided 30 minutes of non-face-to-face time during this encounter.   Yevette Edwards, MD

## 2022-04-06 ENCOUNTER — Other Ambulatory Visit: Payer: Self-pay | Admitting: Physician Assistant

## 2022-04-06 DIAGNOSIS — M79604 Pain in right leg: Secondary | ICD-10-CM

## 2022-04-10 ENCOUNTER — Ambulatory Visit
Admission: RE | Admit: 2022-04-10 | Discharge: 2022-04-10 | Disposition: A | Discharge status: Home / Self Care | Payer: Medicare HMO | Source: Ambulatory Visit | Attending: Physician Assistant | Admitting: Physician Assistant

## 2022-04-10 DIAGNOSIS — M79605 Pain in left leg: Secondary | ICD-10-CM | POA: Insufficient documentation

## 2022-04-10 DIAGNOSIS — M79604 Pain in right leg: Secondary | ICD-10-CM | POA: Insufficient documentation

## 2022-06-03 ENCOUNTER — Telehealth: Payer: Self-pay

## 2022-06-03 ENCOUNTER — Encounter: Payer: Self-pay | Admitting: Anesthesiology

## 2022-06-03 ENCOUNTER — Ambulatory Visit: Payer: Medicare HMO | Attending: Anesthesiology | Admitting: Anesthesiology

## 2022-06-03 VITALS — BP 147/81 | HR 93 | Temp 98.0°F | Resp 18 | Ht 72.0 in | Wt 240.0 lb

## 2022-06-03 DIAGNOSIS — G8929 Other chronic pain: Secondary | ICD-10-CM | POA: Insufficient documentation

## 2022-06-03 DIAGNOSIS — M25512 Pain in left shoulder: Secondary | ICD-10-CM | POA: Diagnosis present

## 2022-06-03 DIAGNOSIS — E1142 Type 2 diabetes mellitus with diabetic polyneuropathy: Secondary | ICD-10-CM | POA: Insufficient documentation

## 2022-06-03 DIAGNOSIS — Z79891 Long term (current) use of opiate analgesic: Secondary | ICD-10-CM | POA: Insufficient documentation

## 2022-06-03 DIAGNOSIS — G894 Chronic pain syndrome: Secondary | ICD-10-CM | POA: Diagnosis not present

## 2022-06-03 DIAGNOSIS — M79604 Pain in right leg: Secondary | ICD-10-CM | POA: Insufficient documentation

## 2022-06-03 DIAGNOSIS — M79605 Pain in left leg: Secondary | ICD-10-CM | POA: Insufficient documentation

## 2022-06-03 DIAGNOSIS — M5136 Other intervertebral disc degeneration, lumbar region: Secondary | ICD-10-CM | POA: Diagnosis not present

## 2022-06-03 DIAGNOSIS — M5441 Lumbago with sciatica, right side: Secondary | ICD-10-CM | POA: Insufficient documentation

## 2022-06-03 DIAGNOSIS — M25511 Pain in right shoulder: Secondary | ICD-10-CM | POA: Insufficient documentation

## 2022-06-03 DIAGNOSIS — M961 Postlaminectomy syndrome, not elsewhere classified: Secondary | ICD-10-CM | POA: Diagnosis not present

## 2022-06-03 MED ORDER — TRAMADOL HCL 50 MG PO TABS: 100.0000 mg | ORAL_TABLET | Freq: Three times a day (TID) | ORAL | 2 refills | Status: DC

## 2022-06-03 NOTE — Progress Notes (Signed)
Nursing Pain Medication Assessment:  Safety precautions to be maintained throughout the outpatient stay will include: orient to surroundings, keep bed in low position, maintain call bell within reach at all times, provide assistance with transfer out of bed and ambulation.  Medication Inspection Compliance: Pill count conducted under aseptic conditions, in front of the patient. Neither the pills nor the bottle was removed from the patient's sight at any time. Once count was completed pills were immediately returned to the patient in their original bottle.  Medication: Tramadol (Ultram) Pill/Patch Count:  13 of 60 pills remain Pill/Patch Appearance: Markings consistent with prescribed medication Bottle Appearance: Standard pharmacy container. Clearly labeled. Filled Date: did not bring most recent bottle Last Medication intake:  Yesterday Safety precautions to be maintained throughout the outpatient stay will include: orient to surroundings, keep bed in low position, maintain call bell within reach at all times, provide assistance with transfer out of bed and ambulation.

## 2022-06-06 LAB — TOXASSURE SELECT 13 (MW), URINE

## 2022-06-10 ENCOUNTER — Encounter: Payer: Self-pay | Admitting: Anesthesiology

## 2022-06-10 MED ORDER — TRAMADOL HCL 50 MG PO TABS: 100.0000 mg | ORAL_TABLET | Freq: Three times a day (TID) | ORAL | 0 refills | Status: AC

## 2022-06-10 NOTE — Progress Notes (Signed)
I tried multiple times last week and today to get in touch with Aaron [name] regarding his medication refill and appointment.  I will schedule him for a return to clinic in 1 month.  I have renewed his tramadol for this month.  A message was left on his recorder.

## 2022-12-10 ENCOUNTER — Other Ambulatory Visit: Payer: Self-pay | Admitting: Anesthesiology

## 2022-12-16 ENCOUNTER — Other Ambulatory Visit: Payer: Self-pay

## 2022-12-16 ENCOUNTER — Encounter: Payer: Self-pay | Admitting: Anesthesiology

## 2022-12-16 ENCOUNTER — Ambulatory Visit: Payer: Medicare HMO | Attending: Anesthesiology | Admitting: Anesthesiology

## 2022-12-16 ENCOUNTER — Emergency Department: Payer: Medicare HMO

## 2022-12-16 ENCOUNTER — Inpatient Hospital Stay
Admission: EM | Admit: 2022-12-16 | Discharge: 2022-12-19 | DRG: 638 | Disposition: A | Discharge status: Home / Self Care | Payer: Medicare HMO | Attending: Internal Medicine | Admitting: Internal Medicine

## 2022-12-16 DIAGNOSIS — M109 Gout, unspecified: Secondary | ICD-10-CM | POA: Diagnosis present

## 2022-12-16 DIAGNOSIS — G894 Chronic pain syndrome: Secondary | ICD-10-CM | POA: Diagnosis not present

## 2022-12-16 DIAGNOSIS — E1142 Type 2 diabetes mellitus with diabetic polyneuropathy: Secondary | ICD-10-CM

## 2022-12-16 DIAGNOSIS — M5136 Other intervertebral disc degeneration, lumbar region: Secondary | ICD-10-CM

## 2022-12-16 DIAGNOSIS — I2489 Other forms of acute ischemic heart disease: Secondary | ICD-10-CM | POA: Diagnosis present

## 2022-12-16 DIAGNOSIS — Z888 Allergy status to other drugs, medicaments and biological substances status: Secondary | ICD-10-CM

## 2022-12-16 DIAGNOSIS — Z882 Allergy status to sulfonamides status: Secondary | ICD-10-CM

## 2022-12-16 DIAGNOSIS — M79604 Pain in right leg: Secondary | ICD-10-CM

## 2022-12-16 DIAGNOSIS — R079 Chest pain, unspecified: Secondary | ICD-10-CM | POA: Diagnosis present

## 2022-12-16 DIAGNOSIS — I130 Hypertensive heart and chronic kidney disease with heart failure and stage 1 through stage 4 chronic kidney disease, or unspecified chronic kidney disease: Secondary | ICD-10-CM | POA: Diagnosis present

## 2022-12-16 DIAGNOSIS — N179 Acute kidney failure, unspecified: Secondary | ICD-10-CM | POA: Diagnosis present

## 2022-12-16 DIAGNOSIS — Z79899 Other long term (current) drug therapy: Secondary | ICD-10-CM

## 2022-12-16 DIAGNOSIS — Z7984 Long term (current) use of oral hypoglycemic drugs: Secondary | ICD-10-CM

## 2022-12-16 DIAGNOSIS — I2510 Atherosclerotic heart disease of native coronary artery without angina pectoris: Secondary | ICD-10-CM | POA: Diagnosis present

## 2022-12-16 DIAGNOSIS — L03031 Cellulitis of right toe: Secondary | ICD-10-CM | POA: Diagnosis present

## 2022-12-16 DIAGNOSIS — Z7982 Long term (current) use of aspirin: Secondary | ICD-10-CM

## 2022-12-16 DIAGNOSIS — M25511 Pain in right shoulder: Secondary | ICD-10-CM

## 2022-12-16 DIAGNOSIS — M25512 Pain in left shoulder: Secondary | ICD-10-CM

## 2022-12-16 DIAGNOSIS — E1122 Type 2 diabetes mellitus with diabetic chronic kidney disease: Secondary | ICD-10-CM | POA: Diagnosis present

## 2022-12-16 DIAGNOSIS — Z7902 Long term (current) use of antithrombotics/antiplatelets: Secondary | ICD-10-CM

## 2022-12-16 DIAGNOSIS — M961 Postlaminectomy syndrome, not elsewhere classified: Secondary | ICD-10-CM | POA: Diagnosis not present

## 2022-12-16 DIAGNOSIS — Z79891 Long term (current) use of opiate analgesic: Secondary | ICD-10-CM

## 2022-12-16 DIAGNOSIS — N1831 Chronic kidney disease, stage 3a: Secondary | ICD-10-CM | POA: Diagnosis present

## 2022-12-16 DIAGNOSIS — E1129 Type 2 diabetes mellitus with other diabetic kidney complication: Secondary | ICD-10-CM | POA: Diagnosis present

## 2022-12-16 DIAGNOSIS — G8929 Other chronic pain: Secondary | ICD-10-CM

## 2022-12-16 DIAGNOSIS — K219 Gastro-esophageal reflux disease without esophagitis: Secondary | ICD-10-CM | POA: Diagnosis present

## 2022-12-16 DIAGNOSIS — E669 Obesity, unspecified: Secondary | ICD-10-CM | POA: Diagnosis present

## 2022-12-16 DIAGNOSIS — R110 Nausea: Secondary | ICD-10-CM | POA: Diagnosis present

## 2022-12-16 DIAGNOSIS — E11628 Type 2 diabetes mellitus with other skin complications: Secondary | ICD-10-CM | POA: Diagnosis not present

## 2022-12-16 DIAGNOSIS — E782 Mixed hyperlipidemia: Secondary | ICD-10-CM | POA: Diagnosis present

## 2022-12-16 DIAGNOSIS — M5441 Lumbago with sciatica, right side: Secondary | ICD-10-CM | POA: Diagnosis not present

## 2022-12-16 DIAGNOSIS — Z955 Presence of coronary angioplasty implant and graft: Secondary | ICD-10-CM

## 2022-12-16 DIAGNOSIS — M51369 Other intervertebral disc degeneration, lumbar region without mention of lumbar back pain or lower extremity pain: Secondary | ICD-10-CM

## 2022-12-16 DIAGNOSIS — E1140 Type 2 diabetes mellitus with diabetic neuropathy, unspecified: Secondary | ICD-10-CM | POA: Diagnosis present

## 2022-12-16 DIAGNOSIS — I200 Unstable angina: Secondary | ICD-10-CM

## 2022-12-16 DIAGNOSIS — M79605 Pain in left leg: Secondary | ICD-10-CM

## 2022-12-16 DIAGNOSIS — L02611 Cutaneous abscess of right foot: Principal | ICD-10-CM

## 2022-12-16 DIAGNOSIS — M5417 Radiculopathy, lumbosacral region: Secondary | ICD-10-CM

## 2022-12-16 DIAGNOSIS — L600 Ingrowing nail: Secondary | ICD-10-CM | POA: Diagnosis present

## 2022-12-16 DIAGNOSIS — Z6832 Body mass index (BMI) 32.0-32.9, adult: Secondary | ICD-10-CM

## 2022-12-16 DIAGNOSIS — M5416 Radiculopathy, lumbar region: Secondary | ICD-10-CM

## 2022-12-16 DIAGNOSIS — I10 Essential (primary) hypertension: Secondary | ICD-10-CM | POA: Diagnosis present

## 2022-12-16 DIAGNOSIS — I25110 Atherosclerotic heart disease of native coronary artery with unstable angina pectoris: Secondary | ICD-10-CM | POA: Diagnosis present

## 2022-12-16 DIAGNOSIS — Z87891 Personal history of nicotine dependence: Secondary | ICD-10-CM

## 2022-12-16 DIAGNOSIS — I252 Old myocardial infarction: Secondary | ICD-10-CM

## 2022-12-16 DIAGNOSIS — I5022 Chronic systolic (congestive) heart failure: Secondary | ICD-10-CM | POA: Diagnosis present

## 2022-12-16 LAB — CBC
HCT: 39.5 % (ref 39.0–52.0)
Hemoglobin: 13.3 g/dL (ref 13.0–17.0)
MCH: 30.3 pg (ref 26.0–34.0)
MCHC: 33.7 g/dL (ref 30.0–36.0)
MCV: 90 fL (ref 80.0–100.0)
Platelets: 207 10*3/uL (ref 150–400)
RBC: 4.39 MIL/uL (ref 4.22–5.81)
RDW: 14.6 % (ref 11.5–15.5)
WBC: 8.8 10*3/uL (ref 4.0–10.5)
nRBC: 0 % (ref 0.0–0.2)

## 2022-12-16 LAB — BASIC METABOLIC PANEL
Anion gap: 11 (ref 5–15)
BUN: 36 mg/dL — ABNORMAL HIGH (ref 8–23)
CO2: 21 mmol/L — ABNORMAL LOW (ref 22–32)
Calcium: 9.1 mg/dL (ref 8.9–10.3)
Chloride: 103 mmol/L (ref 98–111)
Creatinine, Ser: 1.78 mg/dL — ABNORMAL HIGH (ref 0.61–1.24)
GFR, Estimated: 39 mL/min — ABNORMAL LOW (ref >60)
Glucose, Bld: 219 mg/dL — ABNORMAL HIGH (ref 70–99)
Potassium: 4.7 mmol/L (ref 3.5–5.1)
Sodium: 135 mmol/L (ref 135–145)

## 2022-12-16 LAB — TROPONIN I (HIGH SENSITIVITY): Troponin I (High Sensitivity): 9 ng/L (ref <18)

## 2022-12-16 MED ORDER — TRAMADOL HCL 50 MG PO TABS: 100.0000 mg | ORAL_TABLET | Freq: Three times a day (TID) | ORAL | 2 refills | Status: AC

## 2022-12-16 NOTE — Progress Notes (Signed)
Virtual Visit via Telephone Note  I connected with Aaron [name]. on 12/16/22 at  3:20 PM EDT by telephone and verified that I am speaking with the correct person using two identifiers.  Location: Patient: Home Provider: [facility] control center   I discussed the limitations, risks, security and privacy concerns of performing an evaluation and management service by telephone and the availability of in person appointments. I also discussed with the patient that there may be a patient responsible charge related to this service. The patient expressed understanding and agreed to proceed.   History of Present Illness: I spoke with Aaron [name] via telephone as we were unable like for the video portion of the conference.  He reports that he is doing well.  He staying active and taking his tramadol approximately 1 or 2 tablets 3 times a day to help with chronic [name].  This has been a stable condition.  No change in the quality characteristic or distribution of his [name] is noted.  He continues to have chronic low back [name] occasional neck [name] chronic shoulder [name] but all of these have been stable with no recent changes noted.  The medications enable him to stay active.  He has failed more conservative therapy but the tramadol is well received.  No side effects are noted.  With the tramadol he is getting about 75% relief lasting about 6 hours or so before he has recurrence of his baseline [name].  With the tramadol he is able to stay active and do household chores daily life activities and sleep better at night.  No change in upper or lower extremity strength function bowel or bladder function is noted at this time.  Review of systems: General: No fevers or chills Pulmonary: No shortness of breath or dyspnea Cardiac: No angina or palpitations or lightheadedness GI: No abdominal [name] or constipation Psych: No depression    Observations/Objective:  Current Outpatient Medications:    traMADol  (ULTRAM) 50 MG tablet, Take 2 tablets (100 mg total) by mouth in the morning, at noon, and at bedtime., Disp: 180 tablet, Rfl: 2   amLODipine (NORVASC) 5 MG tablet, Take by mouth., Disp: , Rfl:    aspirin 81 MG chewable tablet, Chew 1 tablet (81 mg total) by mouth daily., Disp:  , Rfl:    augmented betamethasone dipropionate (DIPROLENE-AF) 0.05 % cream, Apply topically 2 (two) times daily., Disp: , Rfl:    carvedilol (COREG) 3.125 MG tablet, Take 1 tablet (3.125 mg total) by mouth 2 (two) times daily with a meal., Disp: 60 tablet, Rfl: 0   colchicine 0.6 MG tablet, Take by mouth., Disp: , Rfl:    ezetimibe (ZETIA) 10 MG tablet, Take 1 tablet (10 mg total) by mouth daily., Disp: 30 tablet, Rfl: 0   glipiZIDE (GLUCOTROL) 10 MG tablet, Take 10 mg by mouth 2 (two) times daily before a meal., Disp: , Rfl:    metFORMIN (GLUCOPHAGE-XR) 500 MG 24 hr tablet, Take 1,000 mg by mouth daily., Disp: , Rfl:    nitroGLYCERIN (NITROSTAT) 0.4 MG SL tablet, Take 0.4 mg by mouth every 5 (five) minutes as needed for chest [name]. , Disp: , Rfl:    PRALUENT 150 MG/ML SOAJ, SMARTSIG:150 Milligram(s) SUB-Q Every 2 Weeks, Disp: , Rfl:    ticagrelor (BRILINTA) 90 MG TABS tablet, Take 1 tablet (90 mg total) by mouth 2 (two) times daily., Disp: 60 tablet, Rfl: 0   VASCEPA 1 g capsule, Take 2 g by mouth 2 (two) times  daily., Disp: , Rfl:    vitamin B-12 (CYANOCOBALAMIN) 1000 MCG tablet, Take 1,000 mcg by mouth daily., Disp: , Rfl:    Past Medical History:  Diagnosis Date   Cellulitis    Diabetes mellitus without complication (HCC)    MI, old      Assessment and Plan: 1. Chronic [name] syndrome   2. Failed back surgical syndrome (x3)   3. 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 DDD (degenerative disc disease), lumbar   5. Chronic lower extremity [name] (2ry area of [name]) (Bilateral) (R>L)   6. Chronic shoulder [name] (Bilateral)   7. Chronic use of opiate for therapeutic purpose    8. Diabetic peripheral neuropathy (HCC)   9. Pharmacologic therapy   10. 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  This will be for tramadol 2 tablets up to 3 times a day as needed.  Continue with core stretching strengthening exercises and outdoor activities.  No other changes in his regimen will be initiated.  Will schedule him for return to clinic in 3 months.  Continue follow-up with his primary care physicians for baseline medical care.  Follow Up Instructions:    I discussed the assessment and treatment plan with the patient. The patient was provided an opportunity to ask questions and all were answered. The patient agreed with the plan and demonstrated an understanding of the instructions.   The patient was advised to call back or seek an in-person evaluation if the symptoms worsen or if the condition fails to improve as anticipated.  I provided 30 minutes of non-face-to-face time during this encounter.   Yevette Edwards, MD

## 2022-12-16 NOTE — ED Provider Notes (Signed)
[facility] Provider Note    Event Date/Time   First MD Initiated Contact with Patient 12/16/22 2343     (approximate)   History   Chest [name]   HPI {Remember to add pertinent medical, surgical, social, and/or OB history to HPI:1} Aaron [name]. is a 77 y.o. 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   Chronic low back [name] (1ry area of [name]) (Bilateral) (R>L) w/ sciatica (Right) 05/14/2021   Chronic right hip [name] 05/14/2021   Chronic radicular [name] of lower extremity (Right) 05/14/2021   Chronic lumbar radicular [name] (Right) 05/14/2021   Lumbosacral sensory radiculopathy at L5 (Right) 05/14/2021   Diabetic peripheral neuropathy (HCC) 05/14/2021   Chronic anticoagulation (Brilinta) 05/14/2021   DDD (degenerative disc disease), lumbar 05/14/2021   Chronic shoulder [name] (Bilateral) 05/14/2021   GERD (gastroesophageal reflux disease) 12/20/2020   Chronic systolic CHF (congestive heart failure) (HCC) 12/20/2020   Ischemic cardiomyopathy 06/10/2020   Duodenal ulcer 05/06/2020   Chest [name] 06/12/2019   CKD (chronic kidney disease), stage IIIa 06/12/2019   NSTEMI (non-ST elevated myocardial infarction) (HCC) 06/12/2019   Dark stools 06/12/2019   Type 2 diabetes mellitus (HCC) 06/12/2019   Degenerative tear of glenoid labrum of left shoulder 04/26/2019   Tendinitis of upper biceps tendon of left shoulder 04/19/2019   Rotator cuff tendinitis, left 02/24/2019   Traumatic incomplete tear of left rotator cuff 02/24/2019   Rotator cuff tendinitis, right  02/07/2018   Unstable angina (HCC) 10/21/2015   Bilateral carotid artery stenosis 09/30/2015   Status post total left knee replacement 09/30/2015   CAD (coronary artery disease) 09/19/2014   Benign essential HTN 09/19/2014   Hyperlipidemia, mixed 09/18/2014   Hypercholesterolemia 02/08/2011     Past Surgical History   Past Surgical History:  Procedure Laterality Date   ABDOMINAL SURGERY     APPENDECTOMY     CARDIAC CATHETERIZATION Left 10/21/2015   Procedure: Left Heart Cath and Coronary Angiography;  Surgeon: Aaron Blinks, MD;  Location: [facility];  Service: Cardiovascular;  Laterality: Left;   CARDIAC CATHETERIZATION N/A 10/21/2015   Procedure: Coronary Stent Intervention;  Surgeon: Aaron Millard, MD;  Location: [facility];  Service: Cardiovascular;  Laterality: N/A;   CHOLECYSTECTOMY     CORONARY ANGIOPLASTY WITH STENT PLACEMENT     CORONARY STENT INTERVENTION N/A 06/13/2019   Procedure: CORONARY STENT INTERVENTION;  Surgeon: Aaron Millard, MD;  Location: [facility];  Service: Cardiovascular;  Laterality: N/A;   CORONARY STENT INTERVENTION N/A 12/20/2020   Procedure: CORONARY STENT INTERVENTION;  Surgeon: Aaron Ouch, MD;  Location: [facility];  Service: Cardiovascular;  Laterality: N/A;   LEFT HEART CATH AND CORONARY ANGIOGRAPHY Left 10/07/2016   Procedure: Left Heart Cath and Coronary Angiography;  Surgeon: Aaron Blinks, MD;  Location: [facility];  Service: Cardiovascular;  Laterality: Left;   LEFT HEART CATH AND CORONARY ANGIOGRAPHY N/A 06/13/2019   Procedure: LEFT HEART CATH AND CORONARY ANGIOGRAPHY;  Surgeon: Aaron [name],  Aaron Cornwall, MD;  Location: [facility];  Service: Cardiovascular;  Laterality: N/A;   LEFT HEART CATH AND CORONARY ANGIOGRAPHY N/A 12/20/2020   Procedure: LEFT HEART CATH AND CORONARY ANGIOGRAPHY;  Surgeon: Aaron Ouch, MD;  Location: [facility];  Service: Cardiovascular;   Laterality: N/A;   SHOULDER ARTHROSCOPY WITH SUBACROMIAL DECOMPRESSION AND BICEP TENDON REPAIR Right 02/24/2018   Procedure: SHOULDER ARTHROSCOPY WITH SUBACROMIAL DEBRIDEMENT, DECOMPRESSION, ROTATOR CUFF REPAIR AND POSSIBLE BICEP TENODESIS;  Surgeon: Aaron Flake, MD;  Location: [facility];  Service: Orthopedics;  Laterality: Right;   SHOULDER ARTHROSCOPY WITH SUBACROMIAL DECOMPRESSION AND BICEP TENDON REPAIR Left 04/25/2019   Procedure: SHOULDER ARTHROSCOPY WITH DEBRIDEMENT, DECOMPRESSION AND BICEP TENDON REPAIR, ROTATOR CUFF REPAIR;  Surgeon: Aaron Flake, MD;  Location: [facility];  Service: Orthopedics;  Laterality: Left;   TONSILLECTOMY       Home Medications   Prior to Admission medications   Medication Sig Start Date End Date Taking? Authorizing Provider  amLODipine (NORVASC) 5 MG tablet Take by mouth. 12/26/20 12/26/21  [provider]  aspirin 81 MG chewable tablet Chew 1 tablet (81 mg total) by mouth daily. 06/15/19   Aaron Ito, MD  augmented betamethasone dipropionate (DIPROLENE-AF) 0.05 % cream Apply topically 2 (two) times daily. 03/27/22   [provider]  carvedilol (COREG) 3.125 MG tablet Take 1 tablet (3.125 mg total) by mouth 2 (two) times daily with a meal. 12/21/20   Aaron Coy, MD  colchicine 0.6 MG tablet Take by mouth. 06/30/19   [provider]  ezetimibe (ZETIA) 10 MG tablet Take 1 tablet (10 mg total) by mouth daily. 06/15/19   Aaron Ito, MD  glipiZIDE (GLUCOTROL) 10 MG tablet Take 10 mg by mouth 2 (two) times daily before a meal.    [provider]  metFORMIN (GLUCOPHAGE-XR) 500 MG 24 hr tablet Take 1,000 mg by mouth daily. 04/02/22   [provider]  nitroGLYCERIN (NITROSTAT) 0.4 MG SL tablet Take 0.4 mg by mouth every 5 (five) minutes as needed for chest [name].  03/29/14   [provider]  PRALUENT 150 MG/ML SOAJ SMARTSIG:150 Milligram(s) SUB-Q Every 2 Weeks 02/28/21   [provider]  ticagrelor (BRILINTA) 90 MG  TABS tablet Take 1 tablet (90 mg total) by mouth 2 (two) times daily. 12/21/20   Aaron Coy, MD  traMADol (ULTRAM) 50 MG tablet Take 2 tablets (100 mg total) by mouth in the morning, at noon, and at bedtime. 12/16/22 01/15/23  Aaron Edwards, MD  VASCEPA 1 g capsule Take 2 g by mouth 2 (two) times daily.    [provider]  vitamin B-12 (CYANOCOBALAMIN) 1000 MCG tablet Take 1,000 mcg by mouth daily.    [provider]     Allergies  Ace inhibitors, Isosorbide, Losartan, Metoprolol tartrate, Statins, and Sulfa antibiotics   Family History   Family History  Problem Relation Age of Onset   Angina Mother      Physical Exam  Triage Vital Signs: ED Triage Vitals  Enc Vitals Group     BP 12/16/22 2029 (!) 153/74     Pulse Rate 12/16/22 2029 92     Resp 12/16/22 2029 18     Temp 12/16/22 2029 98.4 F (36.9 C)     Temp Source 12/16/22 2029 Oral     SpO2 12/16/22 2029 99 %     Weight 12/16/22 2039 240 lb (108.9 kg)     Height 12/16/22 2039 6' (1.829 m)  Head Circumference --      Peak Flow --      [name] Score 12/16/22 2038 2     [name] Loc --      [name] Edu? --      Excl. in GC? --     Updated Vital Signs: BP (!) 153/74 (BP Location: Right Arm)   Pulse 92   Temp 98.4 F (36.9 C) (Oral)   Resp 18   Ht 6' (1.829 m)   Wt 108.9 kg   SpO2 99%   BMI 32.55 kg/m   {Only need to document appropriate and relevant physical exam:1} General: Awake, no distress. *** CV:  Good peripheral perfusion. *** Resp:  Normal effort. *** Abd:  No distention. *** Other:  ***   ED Results / Procedures / Treatments  Labs (all labs ordered are listed, but only abnormal results are displayed) Labs Reviewed  BASIC METABOLIC PANEL - Abnormal; Notable for the following components:      Result Value   CO2 21 (*)    Glucose, Bld 219 (*)    BUN 36 (*)    Creatinine, Ser 1.78 (*)    GFR, Estimated 39 (*)    All other components within normal limits  CBC  TROPONIN I (HIGH  SENSITIVITY)  TROPONIN I (HIGH SENSITIVITY)     EKG  ***   RADIOLOGY *** {You MUST document your own interpretation of imaging, as well as the fact that you reviewed the radiologist's report!:1}  Official radiology report(s): DG Toe Great Right  Result Date: 12/16/2022 CLINICAL DATA:  Right great toe [name] EXAM: RIGHT GREAT TOE COMPARISON:  None Available. FINDINGS: Normal alignment. No acute fracture or dislocation. No osseous erosions or abnormal periosteal reaction. 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 There is diffuse soft tissue swelling of the great toe, more severe surrounding the distal phalanx. Ulcer noted along the dorsomedial aspect of the great toe. No subcutaneous gas or retained radiopaque foreign body identified. IMPRESSION: 1. Soft tissue swelling and ulceration. No subcutaneous gas or retained radiopaque foreign body. 2. No radiographic evidence of osteomyelitis. Electronically Signed   By: Helyn Numbers M.D.   On: 12/16/2022 21:27   DG Chest 2 View  Result Date: 12/16/2022 CLINICAL DATA:  Chest [name] EXAM: CHEST - 2 VIEW COMPARISON:  12/20/2020 FINDINGS: The heart size and mediastinal contours are within normal limits. Both lungs are clear. The visualized skeletal structures are unremarkable. IMPRESSION: No active cardiopulmonary disease. Electronically Signed   By: Helyn Numbers M.D.   On: 12/16/2022 21:25     PROCEDURES:  Critical Care performed: {CriticalCareYesNo:19197::"Yes, see critical care procedure note(s)","No"}  Procedures   MEDICATIONS ORDERED IN ED: Medications - No data to display   IMPRESSION / MDM / ASSESSMENT AND PLAN / ED COURSE  I reviewed the triage vital signs and the nursing notes.                              Differential diagnosis includes, but is not limited to, ***  Patient's presentation is most consistent with {EM COPA:27473}  {If the patient  is on the monitor, remove the brackets and asterisks on the sentence below and remember to document it as a Procedure as well. Otherwise delete the sentence below:1} {**The patient is on the cardiac monitor to evaluate for evidence of arrhythmia and/or significant heart rate changes.**}  {Remember  to include, when applicable, any/all of the following data: independent review of imaging independent review of labs (comment specifically on pertinent positives and negatives) review of specific prior hospitalizations, PCP/specialist notes, etc. discuss meds given and prescribed document any discussion with consultants (including hospitalists) any clinical decision tools you used and why (PECARN, NEXUS, etc.) did you consider admitting the patient? document social determinants of health affecting patient's care (homelessness, inability to follow up in a timely fashion, etc) document any pre-existing conditions increasing risk on current visit (e.g. diabetes and HTN increasing danger of high-risk chest [name]/ACS) describes what meds you gave (especially parenteral) and why any other interventions?:1}      FINAL CLINICAL IMPRESSION(S) / ED DIAGNOSES   Final diagnoses:  None     Rx / DC Orders   ED Discharge Orders     None        Note:  This document was prepared using Dragon voice recognition software and may include unintentional dictation errors.

## 2022-12-16 NOTE — ED Triage Notes (Signed)
Pt to ED via POV c/o chest pain and high blood pressure for a few days. Pt reports having central CP, states "feels like indigestion" . Took nitroglycerin yesterday with no relief. Pain does not radiate. Hx of stents. Pt also complaining of right big toe pain. Pt took his nail out because it was bothering him and he had an ingrown toenail. Some redness to toe, pt reports it was swollen 2 days ago but isnt at this time. Denies SOB, fevers, dizziness

## 2022-12-17 ENCOUNTER — Encounter: Payer: Self-pay | Admitting: Internal Medicine

## 2022-12-17 DIAGNOSIS — E669 Obesity, unspecified: Secondary | ICD-10-CM

## 2022-12-17 DIAGNOSIS — R079 Chest pain, unspecified: Secondary | ICD-10-CM

## 2022-12-17 DIAGNOSIS — K219 Gastro-esophageal reflux disease without esophagitis: Secondary | ICD-10-CM | POA: Diagnosis present

## 2022-12-17 DIAGNOSIS — I10 Essential (primary) hypertension: Secondary | ICD-10-CM

## 2022-12-17 DIAGNOSIS — L089 Local infection of the skin and subcutaneous tissue, unspecified: Secondary | ICD-10-CM

## 2022-12-17 DIAGNOSIS — E1129 Type 2 diabetes mellitus with other diabetic kidney complication: Secondary | ICD-10-CM | POA: Diagnosis present

## 2022-12-17 DIAGNOSIS — Z6832 Body mass index (BMI) 32.0-32.9, adult: Secondary | ICD-10-CM | POA: Diagnosis not present

## 2022-12-17 DIAGNOSIS — Z7982 Long term (current) use of aspirin: Secondary | ICD-10-CM | POA: Diagnosis not present

## 2022-12-17 DIAGNOSIS — Z87891 Personal history of nicotine dependence: Secondary | ICD-10-CM | POA: Diagnosis not present

## 2022-12-17 DIAGNOSIS — E782 Mixed hyperlipidemia: Secondary | ICD-10-CM | POA: Diagnosis present

## 2022-12-17 DIAGNOSIS — N179 Acute kidney failure, unspecified: Secondary | ICD-10-CM | POA: Diagnosis present

## 2022-12-17 DIAGNOSIS — Z888 Allergy status to other drugs, medicaments and biological substances status: Secondary | ICD-10-CM | POA: Diagnosis not present

## 2022-12-17 DIAGNOSIS — E1140 Type 2 diabetes mellitus with diabetic neuropathy, unspecified: Secondary | ICD-10-CM | POA: Diagnosis present

## 2022-12-17 DIAGNOSIS — I25110 Atherosclerotic heart disease of native coronary artery with unstable angina pectoris: Secondary | ICD-10-CM | POA: Diagnosis present

## 2022-12-17 DIAGNOSIS — I130 Hypertensive heart and chronic kidney disease with heart failure and stage 1 through stage 4 chronic kidney disease, or unspecified chronic kidney disease: Secondary | ICD-10-CM | POA: Diagnosis present

## 2022-12-17 DIAGNOSIS — E1122 Type 2 diabetes mellitus with diabetic chronic kidney disease: Secondary | ICD-10-CM | POA: Diagnosis present

## 2022-12-17 DIAGNOSIS — I252 Old myocardial infarction: Secondary | ICD-10-CM | POA: Diagnosis not present

## 2022-12-17 DIAGNOSIS — N1831 Chronic kidney disease, stage 3a: Secondary | ICD-10-CM | POA: Diagnosis present

## 2022-12-17 DIAGNOSIS — Z882 Allergy status to sulfonamides status: Secondary | ICD-10-CM | POA: Diagnosis not present

## 2022-12-17 DIAGNOSIS — E11628 Type 2 diabetes mellitus with other skin complications: Secondary | ICD-10-CM | POA: Diagnosis present

## 2022-12-17 DIAGNOSIS — L03031 Cellulitis of right toe: Secondary | ICD-10-CM

## 2022-12-17 DIAGNOSIS — I5022 Chronic systolic (congestive) heart failure: Secondary | ICD-10-CM | POA: Diagnosis present

## 2022-12-17 DIAGNOSIS — Z955 Presence of coronary angioplasty implant and graft: Secondary | ICD-10-CM | POA: Diagnosis not present

## 2022-12-17 DIAGNOSIS — Z79899 Other long term (current) drug therapy: Secondary | ICD-10-CM | POA: Diagnosis not present

## 2022-12-17 DIAGNOSIS — Z7984 Long term (current) use of oral hypoglycemic drugs: Secondary | ICD-10-CM | POA: Diagnosis not present

## 2022-12-17 DIAGNOSIS — R110 Nausea: Secondary | ICD-10-CM | POA: Diagnosis present

## 2022-12-17 DIAGNOSIS — K269 Duodenal ulcer, unspecified as acute or chronic, without hemorrhage or perforation: Secondary | ICD-10-CM

## 2022-12-17 DIAGNOSIS — I2489 Other forms of acute ischemic heart disease: Secondary | ICD-10-CM | POA: Diagnosis present

## 2022-12-17 DIAGNOSIS — L600 Ingrowing nail: Secondary | ICD-10-CM | POA: Diagnosis present

## 2022-12-17 LAB — TROPONIN I (HIGH SENSITIVITY)
Troponin I (High Sensitivity): 10 ng/L (ref <18)
Troponin I (High Sensitivity): 10 ng/L (ref <18)
Troponin I (High Sensitivity): 10 ng/L (ref <18)

## 2022-12-17 LAB — SEDIMENTATION RATE: Sed Rate: 23 mm/hr — ABNORMAL HIGH (ref 0–20)

## 2022-12-17 LAB — HEPATIC FUNCTION PANEL
ALT: 26 U/L (ref 0–44)
AST: 22 U/L (ref 15–41)
Albumin: 4.2 g/dL (ref 3.5–5.0)
Alkaline Phosphatase: 85 U/L (ref 38–126)
Bilirubin, Direct: 0.1 mg/dL (ref 0.0–0.2)
Indirect Bilirubin: 0.6 mg/dL (ref 0.3–0.9)
Total Bilirubin: 0.7 mg/dL (ref 0.3–1.2)
Total Protein: 7.2 g/dL (ref 6.5–8.1)

## 2022-12-17 LAB — GLUCOSE, CAPILLARY
Glucose-Capillary: 348 mg/dL — ABNORMAL HIGH (ref 70–99)
Glucose-Capillary: 279 mg/dL — ABNORMAL HIGH (ref 70–99)

## 2022-12-17 LAB — LIPASE, BLOOD: Lipase: 80 U/L — ABNORMAL HIGH (ref 11–51)

## 2022-12-17 LAB — C-REACTIVE PROTEIN: CRP: 1.4 mg/dL — ABNORMAL HIGH (ref <1.0)

## 2022-12-17 LAB — APTT: aPTT: 31 seconds (ref 24–36)

## 2022-12-17 LAB — PROTIME-INR
INR: 1 (ref 0.8–1.2)
Prothrombin Time: 13 seconds (ref 11.4–15.2)

## 2022-12-17 LAB — AEROBIC/ANAEROBIC CULTURE W GRAM STAIN (SURGICAL/DEEP WOUND)

## 2022-12-17 LAB — BRAIN NATRIURETIC PEPTIDE: B Natriuretic Peptide: 24.8 pg/mL (ref 0.0–100.0)

## 2022-12-17 LAB — CBG MONITORING, ED: Glucose-Capillary: 305 mg/dL — ABNORMAL HIGH (ref 70–99)

## 2022-12-17 MED ORDER — AMLODIPINE BESYLATE 5 MG PO TABS
5.0000 mg | ORAL_TABLET | Freq: Every day | ORAL | Status: DC
  Administered 2022-12-17 – 2022-12-18 (×2): 5 mg via ORAL
  Filled 2022-12-17 (×2): qty 1

## 2022-12-17 MED ORDER — NITROGLYCERIN 0.4 MG SL SUBL: 0.4000 mg | SUBLINGUAL_TABLET | SUBLINGUAL | Status: DC | PRN

## 2022-12-17 MED ORDER — MORPHINE SULFATE (PF) 2 MG/ML IV SOLN: 1.0000 mg | INTRAVENOUS | Status: DC | PRN

## 2022-12-17 MED ORDER — ICOSAPENT ETHYL 1 G PO CAPS
2.0000 g | ORAL_CAPSULE | Freq: Two times a day (BID) | ORAL | Status: DC
  Administered 2022-12-17 – 2022-12-19 (×5): 2 g via ORAL
  Filled 2022-12-17 (×7): qty 2

## 2022-12-17 MED ORDER — METFORMIN HCL ER 500 MG PO TB24
1000.0000 mg | ORAL_TABLET | Freq: Every day | ORAL | Status: DC
  Administered 2022-12-17 – 2022-12-19 (×3): 1000 mg via ORAL
  Filled 2022-12-17 (×4): qty 2

## 2022-12-17 MED ORDER — ASPIRIN 81 MG PO CHEW
81.0000 mg | CHEWABLE_TABLET | Freq: Every day | ORAL | Status: DC
  Administered 2022-12-17: 81 mg via ORAL
  Filled 2022-12-17: qty 1

## 2022-12-17 MED ORDER — VITAMIN B-12 1000 MCG PO TABS
1000.0000 ug | ORAL_TABLET | Freq: Every day | ORAL | Status: DC
  Administered 2022-12-17 – 2022-12-19 (×3): 1000 ug via ORAL
  Filled 2022-12-17: qty 1
  Filled 2022-12-17: qty 2
  Filled 2022-12-17: qty 1

## 2022-12-17 MED ORDER — NITROGLYCERIN 2 % TD OINT
1.0000 [in_us] | TOPICAL_OINTMENT | Freq: Once | TRANSDERMAL | Status: AC
  Administered 2022-12-17: 1 [in_us] via TOPICAL
  Filled 2022-12-17: qty 1

## 2022-12-17 MED ORDER — GLIPIZIDE 10 MG PO TABS
10.0000 mg | ORAL_TABLET | Freq: Two times a day (BID) | ORAL | Status: DC
  Filled 2022-12-17: qty 1

## 2022-12-17 MED ORDER — INSULIN ASPART 100 UNIT/ML IJ SOLN
0.0000 [IU] | Freq: Three times a day (TID) | INTRAMUSCULAR | Status: DC
  Administered 2022-12-17: 7 [IU] via SUBCUTANEOUS
  Filled 2022-12-17: qty 1

## 2022-12-17 MED ORDER — HYDRALAZINE HCL 20 MG/ML IJ SOLN: 5.0000 mg | INTRAMUSCULAR | Status: DC | PRN

## 2022-12-17 MED ORDER — EZETIMIBE 10 MG PO TABS
10.0000 mg | ORAL_TABLET | Freq: Every day | ORAL | Status: DC
  Administered 2022-12-17 – 2022-12-19 (×3): 10 mg via ORAL
  Filled 2022-12-17 (×3): qty 1

## 2022-12-17 MED ORDER — ACETAMINOPHEN 325 MG PO TABS
650.0000 mg | ORAL_TABLET | ORAL | Status: DC | PRN
  Administered 2022-12-17: 650 mg via ORAL
  Filled 2022-12-17: qty 2

## 2022-12-17 MED ORDER — OXYCODONE-ACETAMINOPHEN 5-325 MG PO TABS: 1.0000 | ORAL_TABLET | ORAL | Status: DC | PRN

## 2022-12-17 MED ORDER — SODIUM CHLORIDE 0.9 % IV SOLN
1.0000 g | Freq: Once | INTRAVENOUS | Status: AC
  Administered 2022-12-17: 1 g via INTRAVENOUS
  Filled 2022-12-17: qty 10

## 2022-12-17 MED ORDER — INSULIN ASPART 100 UNIT/ML IJ SOLN
0.0000 [IU] | Freq: Every day | INTRAMUSCULAR | Status: DC
  Administered 2022-12-17 – 2022-12-18 (×2): 3 [IU] via SUBCUTANEOUS
  Filled 2022-12-17 (×2): qty 1

## 2022-12-17 MED ORDER — ASPIRIN 81 MG PO CHEW
324.0000 mg | CHEWABLE_TABLET | Freq: Once | ORAL | Status: AC
  Administered 2022-12-17: 324 mg via ORAL
  Filled 2022-12-17: qty 4

## 2022-12-17 MED ORDER — FAMOTIDINE IN NACL 20-0.9 MG/50ML-% IV SOLN
20.0000 mg | Freq: Once | INTRAVENOUS | Status: AC
  Administered 2022-12-17: 20 mg via INTRAVENOUS
  Filled 2022-12-17: qty 50

## 2022-12-17 MED ORDER — MORPHINE SULFATE (PF) 2 MG/ML IV SOLN: 2.0000 mg | INTRAVENOUS | Status: DC | PRN

## 2022-12-17 MED ORDER — ASPIRIN 81 MG PO CHEW
81.0000 mg | CHEWABLE_TABLET | Freq: Every day | ORAL | Status: DC
  Administered 2022-12-18 – 2022-12-19 (×2): 81 mg via ORAL
  Filled 2022-12-17 (×2): qty 1

## 2022-12-17 MED ORDER — VANCOMYCIN VARIABLE DOSE PER UNSTABLE RENAL FUNCTION (PHARMACIST DOSING): Status: DC

## 2022-12-17 MED ORDER — VANCOMYCIN HCL 1250 MG/250ML IV SOLN
1250.0000 mg | Freq: Once | INTRAVENOUS | Status: AC
  Administered 2022-12-17: 1250 mg via INTRAVENOUS
  Filled 2022-12-17: qty 250

## 2022-12-17 MED ORDER — CARVEDILOL 3.125 MG PO TABS
3.1250 mg | ORAL_TABLET | Freq: Two times a day (BID) | ORAL | Status: DC
  Administered 2022-12-17 – 2022-12-18 (×2): 3.125 mg via ORAL
  Filled 2022-12-17 (×2): qty 1

## 2022-12-17 MED ORDER — METRONIDAZOLE 500 MG/100ML IV SOLN
500.0000 mg | Freq: Two times a day (BID) | INTRAVENOUS | Status: DC
  Administered 2022-12-17 – 2022-12-18 (×3): 500 mg via INTRAVENOUS
  Filled 2022-12-17 (×3): qty 100

## 2022-12-17 MED ORDER — SODIUM CHLORIDE 0.9 % IV BOLUS
500.0000 mL | Freq: Once | INTRAVENOUS | Status: AC
  Administered 2022-12-17: 500 mL via INTRAVENOUS

## 2022-12-17 MED ORDER — ONDANSETRON HCL 4 MG/2ML IJ SOLN: 4.0000 mg | Freq: Three times a day (TID) | INTRAMUSCULAR | Status: DC | PRN

## 2022-12-17 MED ORDER — SODIUM CHLORIDE 0.9 % IV BOLUS
1000.0000 mL | Freq: Once | INTRAVENOUS | Status: AC
  Administered 2022-12-17: 1000 mL via INTRAVENOUS

## 2022-12-17 MED ORDER — ASPIRIN 81 MG PO CHEW: 81.0000 mg | CHEWABLE_TABLET | Freq: Every day | ORAL | Status: DC

## 2022-12-17 MED ORDER — SODIUM CHLORIDE 0.9 % IV SOLN: 2.0000 g | INTRAVENOUS | Status: DC

## 2022-12-17 NOTE — Consult Note (Signed)
ORTHOPAEDIC CONSULTATION  REQUESTING PHYSICIAN: Lorretta Harp, MD  Chief Complaint: Paronychia right great toe  HPI: Aaron [name]. is a 77 y.o. male who complains of worsening infection from paronychia to right great toe.  Patient states he removed his ingrown nail to his right great toe recently.  He is diabetic with neuropathy.  Afterwards noted purulent drainage from the area.  Consult for further evaluation.  Past Medical History:  Diagnosis Date   Cellulitis    Diabetes mellitus without complication (HCC)    MI, old    Past Surgical History:  Procedure Laterality Date   ABDOMINAL SURGERY     APPENDECTOMY     CARDIAC CATHETERIZATION Left 10/21/2015   Procedure: Left Heart Cath and Coronary Angiography;  Surgeon: Lamar Blinks, MD;  Location: [facility];  Service: Cardiovascular;  Laterality: Left;   CARDIAC CATHETERIZATION N/A 10/21/2015   Procedure: Coronary Stent Intervention;  Surgeon: Marcina Millard, MD;  Location: [facility];  Service: Cardiovascular;  Laterality: N/A;   CHOLECYSTECTOMY     CORONARY ANGIOPLASTY WITH STENT PLACEMENT     CORONARY STENT INTERVENTION N/A 06/13/2019   Procedure: CORONARY STENT INTERVENTION;  Surgeon: Marcina Millard, MD;  Location: [facility];  Service: Cardiovascular;  Laterality: N/A;   CORONARY STENT INTERVENTION N/A 12/20/2020   Procedure: CORONARY STENT INTERVENTION;  Surgeon: Iran Ouch, MD;  Location: [facility];  Service: Cardiovascular;  Laterality: N/A;   LEFT HEART CATH AND CORONARY ANGIOGRAPHY Left 10/07/2016   Procedure: Left Heart Cath and Coronary Angiography;  Surgeon: Lamar Blinks, MD;  Location: [facility];  Service: Cardiovascular;  Laterality: Left;   LEFT HEART CATH AND CORONARY ANGIOGRAPHY N/A 06/13/2019   Procedure: LEFT HEART CATH AND CORONARY ANGIOGRAPHY;  Surgeon: Lamar Blinks, MD;  Location: [facility];  Service: Cardiovascular;   Laterality: N/A;   LEFT HEART CATH AND CORONARY ANGIOGRAPHY N/A 12/20/2020   Procedure: LEFT HEART CATH AND CORONARY ANGIOGRAPHY;  Surgeon: Iran Ouch, MD;  Location: [facility];  Service: Cardiovascular;  Laterality: N/A;   SHOULDER ARTHROSCOPY WITH SUBACROMIAL DECOMPRESSION AND BICEP TENDON REPAIR Right 02/24/2018   Procedure: SHOULDER ARTHROSCOPY WITH SUBACROMIAL DEBRIDEMENT, DECOMPRESSION, ROTATOR CUFF REPAIR AND POSSIBLE BICEP TENODESIS;  Surgeon: Christena Flake, MD;  Location: [facility];  Service: Orthopedics;  Laterality: Right;   SHOULDER ARTHROSCOPY WITH SUBACROMIAL DECOMPRESSION AND BICEP TENDON REPAIR Left 04/25/2019   Procedure: SHOULDER ARTHROSCOPY WITH DEBRIDEMENT, DECOMPRESSION AND BICEP TENDON REPAIR, ROTATOR CUFF REPAIR;  Surgeon: Christena Flake, MD;  Location: [facility];  Service: Orthopedics;  Laterality: Left;   TONSILLECTOMY     Social History   Socioeconomic History   Marital status: Widowed    Spouse name: Not on file   Number of children: Not on file   Years of education: Not on file   Highest education level: Not on file  Occupational History   Not on file  Tobacco Use   Smoking status: Former   Smokeless tobacco: Former   Tobacco comments:    7/20 quit 35 years ago  Vaping Use   Vaping Use: Never used  Substance and Sexual Activity   Alcohol use: No   Drug use: No   Sexual activity: Not on file  Other Topics Concern   Not on file  Social History Narrative   Not on file   Social Determinants of Health   Financial Resource Strain: Not on file  Food  Insecurity: No Food Insecurity (12/17/2022)   Hunger Vital Sign    Worried About Running Out of Food in the Last Year: Never true    Ran Out of Food in the Last Year: Never true  Transportation Needs: No Transportation Needs (12/17/2022)   PRAPARE - Administrator, Civil Service (Medical): No    Lack of Transportation (Non-Medical): No  Physical Activity: Not on file  Stress: Not on  file  Social Connections: Not on file   Family History  Problem Relation Age of Onset   Angina Mother    Allergies  Allergen Reactions   Ace Inhibitors Other (See Comments)    weakness   Isosorbide     weakness   Losartan     weakness   Metoprolol Tartrate Other (See Comments)    weakness   Statins Nausea And Vomiting and Other (See Comments)    Patient states he gets real weak and headache.   Sulfa Antibiotics Other (See Comments)    Unknown childhood reaction    Prior to Admission medications   Medication Sig Start Date End Date Taking? Authorizing Provider  amLODipine (NORVASC) 5 MG tablet Take 5 mg by mouth daily. 12/26/20 12/17/22 Yes [provider]  aspirin 81 MG chewable tablet Chew 1 tablet (81 mg total) by mouth daily. 06/15/19  Yes Lynn Ito, MD  augmented betamethasone dipropionate (DIPROLENE-AF) 0.05 % cream Apply topically 2 (two) times daily. 03/27/22  Yes [provider]  carvedilol (COREG) 3.125 MG tablet Take 1 tablet (3.125 mg total) by mouth 2 (two) times daily with a meal. 12/21/20  Yes Marrion Coy, MD  colchicine 0.6 MG tablet Take 0.6 mg by mouth as directed. 06/30/19  Yes [provider]  ezetimibe (ZETIA) 10 MG tablet Take 1 tablet (10 mg total) by mouth daily. 06/15/19  Yes Lynn Ito, MD  glipiZIDE (GLUCOTROL) 10 MG tablet Take 10 mg by mouth 2 (two) times daily before a meal.   Yes [provider]  indomethacin (INDOCIN) 50 MG capsule Take 50 mg by mouth 3 (three) times daily as needed for moderate pain. 10/07/22  Yes [provider]  metFORMIN (GLUCOPHAGE-XR) 500 MG 24 hr tablet Take 1,000 mg by mouth daily. 04/02/22  Yes [provider]  nitroGLYCERIN (NITROSTAT) 0.4 MG SL tablet Take 0.4 mg by mouth every 5 (five) minutes as needed for chest pain.  03/29/14  Yes [provider]  REPATHA SURECLICK 140 MG/ML SOAJ Inject 140 mg into the skin every 14 (fourteen) days.   Yes [provider]   traMADol (ULTRAM) 50 MG tablet Take 2 tablets (100 mg total) by mouth in the morning, at noon, and at bedtime. 12/16/22 01/15/23 Yes Yevette Edwards, MD  VASCEPA 1 g capsule Take 2 g by mouth 2 (two) times daily.   Yes [provider]  vitamin B-12 (CYANOCOBALAMIN) 1000 MCG tablet Take 1,000 mcg by mouth daily.   Yes [provider]  ticagrelor (BRILINTA) 90 MG TABS tablet Take 1 tablet (90 mg total) by mouth 2 (two) times daily. Patient not taking: Reported on 12/17/2022 12/21/20   Marrion Coy, MD   DG Toe Great Right  Result Date: 12/16/2022 CLINICAL DATA:  Right great toe pain EXAM: RIGHT GREAT TOE COMPARISON:  None Available. FINDINGS: Normal alignment. No acute fracture or dislocation. No osseous erosions or abnormal periosteal reaction. 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 There is  diffuse soft tissue swelling of the great toe, more severe surrounding the distal phalanx. Ulcer noted along the dorsomedial aspect of the great toe. No subcutaneous gas or retained radiopaque foreign body identified. IMPRESSION: 1. Soft tissue swelling and ulceration. No subcutaneous gas or retained radiopaque foreign body. 2. No radiographic evidence of osteomyelitis. Electronically Signed   By: Helyn Numbers M.D.   On: 12/16/2022 21:27   DG Chest 2 View  Result Date: 12/16/2022 CLINICAL DATA:  Chest pain EXAM: CHEST - 2 VIEW COMPARISON:  12/20/2020 FINDINGS: The heart size and mediastinal contours are within normal limits. Both lungs are clear. The visualized skeletal structures are unremarkable. IMPRESSION: No active cardiopulmonary disease. Electronically Signed   By: Helyn Numbers M.D.   On: 12/16/2022 21:25    Positive ROS: All other systems have been reviewed and were otherwise negative with the exception of those mentioned in the HPI and as above.  12 point ROS was performed.  Physical Exam: General:  Alert and oriented.  No apparent distress.  Vascular:  Left foot:Dorsalis Pedis:  present Posterior Tibial:  present  Right foot: Dorsalis Pedis:  present Posterior Tibial:  present  Neuro:absent protective sensation  Derm: The right great toe medial nail border has been avulsed.  There is scant purulent drainage.  This probes deep into the soft tissue next to the distal phalanx.  Ortho/MS: Mild diffuse edema to the right foot compared to the left foot.  I personally reviewed x-rays that were negative for erosive changes.  Assessment: Paronychia right great toe Diabetes with neuropathy  Plan: I cleansed the wound today.  I applied a Betadine dressing.  Orders were written for Betadine dressings to be performed.  Recommend just broad-spectrum antibiotics upon discharge.  Currently gram-positive cocci and gram-negative rods on preliminary culture.  I recommend patient follow-up in 2 weeks in the outpatient clinic with podiatry.  I did discuss the possibility that a deeper infection could occur as the wound does probe all the way down to the distal phalanx at this time.  X-rays are negative.  Podiatry to sign off for now but please reconsult if there is any concerns.    Irean Hong, DPM Cell [phone]   12/17/2022 6:26 PM

## 2022-12-17 NOTE — ED Notes (Signed)
Great right toe erythema, small amount of yellow drainage, toenail was self removed PTA

## 2022-12-17 NOTE — ED Notes (Signed)
Patient provided with crackers and diet soda per request

## 2022-12-17 NOTE — Consult Note (Signed)
Pharmacy Antibiotic Note  ASSESSMENT: 77 y.o. male with PMH cellulitis, DM, CAD (NSTEMI 2020 s/p stent) is presenting with  diabetic foot infection of right great toe . Imaging notable for soft tissue swelling and ulceration but no evidence of osteomyelitis. Pharmacy has been consulted to manage vancomycin. Patient with Scr 1.78 up from last known baseline of 0.97 in 12/2020.   Patient measurements: Height: 6' (182.9 cm) Weight: 108.9 kg (240 lb) IBW/kg (Calculated) : 77.6  Vital signs: Temp: 97.8 F (36.6 C) (06/27 0900) Temp Source: Oral (06/27 0900) BP: 163/86 (06/27 0930) Pulse Rate: 91 (06/27 0930) Recent Labs  Lab 12/16/22 2034  WBC 8.8  CREATININE 1.78*   Estimated Creatinine Clearance: 43.6 mL/min (A) (by C-G formula based on SCr of 1.78 mg/dL (H)).  Allergies: Allergies  Allergen Reactions   Ace Inhibitors Other (See Comments)    weakness   Isosorbide     weakness   Losartan     weakness   Metoprolol Tartrate Other (See Comments)    weakness   Statins Nausea And Vomiting and Other (See Comments)    Patient states he gets real weak and headache.   Sulfa Antibiotics Other (See Comments)    Unknown childhood reaction     Antimicrobials this admission: Ceftriaxone 6/27 >> Vancomycin 6/27 >> Metronidazole 6/27 >>  Dose adjustments this admission: N/A  Microbiology results: 6/27 BCx: sent 6/27 Wound culture: Few GPCs, Few GNRs   PLAN: Initiate vancomycin with 2500 mg load x 1 Check 24 hour vanc level Follow up culture results to assess for antibiotic optimization. Monitor renal function to assess for any necessary antibiotic dosing changes.   Thank you for allowing pharmacy to be a part of this patient's care.  Will M. Dareen Piano, PharmD PGY-1 Pharmacy Resident 12/17/2022 11:51 AM

## 2022-12-17 NOTE — H&P (Signed)
History and Physical    [name]. BMW:[id number] DOB: [birth year] DOA: 12/16/2022  Referring MD/NP/PA:   PCP: Lonie Peak, [name]   Patient coming from:  The patient is coming from home.     Chief Complaint: foot pain and chest pain  HPI: Aaron [name]. is a 77 y.o. male with medical history significant of HTN, HLD, DM, CAD, s/p of stent, dCHF, gout, CKD-3a, present with right great toe pain and chest pain.   Pt states that he pulled his right big toenail 5 weeks ago himself due to ingrown toenail.  He has been inserting a metal as shaped hook in the wound to keep it open and draining. He developed pain with little pus drainage in the right great toe in the past several days. The pain is mild, constant, nonradiating.  His right great toe becomes erythematous and swelling.  No fever or chills. He also reports elevated blood pressure, with SBP up to 190 at home. He reports chest pain.  The chest pain is located in the substernal area, initially 5 out of 10 in severity, currently very minimal, indigestion feeling, nonradiating.  Denies cough or shortness of breath.  No fever or chills.  Patient does not have nausea, vomiting, diarrhea or abdominal pain.  No symptoms of UTI.  Data reviewed independently and ED Course: pt was found to have WBC 8.8, worsening renal function, temperature normal, blood pressure 125/61, heart rate 92 --> 78, RR 21 --> 18, oxygen saturation 94% on room air.  Chest x-ray negative.  Patient is admitted to telemetry bed as inpatient by accepting MD. Consulted Dr. Ether Griffins of podiatry.  EKG: I have personally reviewed.  Sinus rhythm, QTc 425, heart rate 91, poor R wave progression, borderline LAD.  Review of Systems:   General: no fevers, chills, no body weight gain, fatigue HEENT: no blurry vision, hearing changes or sore throat Respiratory: no dyspnea, coughing, wheezing CV: has chest pain, no palpitations GI: no nausea, vomiting, abdominal pain,  diarrhea, constipation GU: no dysuria, burning on urination, increased urinary frequency, hematuria  Ext: no leg edema. Has right great toe pain. Neuro: no unilateral weakness, numbness, or tingling, no vision change or hearing loss Skin: no rash, no skin tear. MSK: No muscle spasm, no deformity, no limitation of range of movement in spin Heme: No easy bruising.  Travel history: No recent long distant travel.   Allergy:  Allergies  Allergen Reactions   Ace Inhibitors Other (See Comments)    weakness   Isosorbide     weakness   Losartan     weakness   Metoprolol Tartrate Other (See Comments)    weakness   Statins Nausea And Vomiting and Other (See Comments)    Patient states he gets real weak and headache.   Sulfa Antibiotics Other (See Comments)    Unknown childhood reaction     Past Medical History:  Diagnosis Date   Cellulitis    Diabetes mellitus without complication (HCC)    MI, old     Past Surgical History:  Procedure Laterality Date   ABDOMINAL SURGERY     APPENDECTOMY     CARDIAC CATHETERIZATION Left 10/21/2015   Procedure: Left Heart Cath and Coronary Angiography;  Surgeon: Lamar Blinks, MD;  Location: [facility];  Service: Cardiovascular;  Laterality: Left;   CARDIAC CATHETERIZATION N/A 10/21/2015   Procedure: Coronary Stent Intervention;  Surgeon: Marcina Millard, MD;  Location: [facility];  Service: Cardiovascular;  Laterality: N/A;   CHOLECYSTECTOMY     CORONARY ANGIOPLASTY WITH STENT PLACEMENT     CORONARY STENT INTERVENTION N/A 06/13/2019   Procedure: CORONARY STENT INTERVENTION;  Surgeon: Marcina Millard, MD;  Location: [facility];  Service: Cardiovascular;  Laterality: N/A;   CORONARY STENT INTERVENTION N/A 12/20/2020   Procedure: CORONARY STENT INTERVENTION;  Surgeon: Iran Ouch, MD;  Location: [facility];  Service: Cardiovascular;  Laterality: N/A;   LEFT HEART CATH AND CORONARY ANGIOGRAPHY Left  10/07/2016   Procedure: Left Heart Cath and Coronary Angiography;  Surgeon: Lamar Blinks, MD;  Location: [facility];  Service: Cardiovascular;  Laterality: Left;   LEFT HEART CATH AND CORONARY ANGIOGRAPHY N/A 06/13/2019   Procedure: LEFT HEART CATH AND CORONARY ANGIOGRAPHY;  Surgeon: Lamar Blinks, MD;  Location: [facility];  Service: Cardiovascular;  Laterality: N/A;   LEFT HEART CATH AND CORONARY ANGIOGRAPHY N/A 12/20/2020   Procedure: LEFT HEART CATH AND CORONARY ANGIOGRAPHY;  Surgeon: Iran Ouch, MD;  Location: [facility];  Service: Cardiovascular;  Laterality: N/A;   SHOULDER ARTHROSCOPY WITH SUBACROMIAL DECOMPRESSION AND BICEP TENDON REPAIR Right 02/24/2018   Procedure: SHOULDER ARTHROSCOPY WITH SUBACROMIAL DEBRIDEMENT, DECOMPRESSION, ROTATOR CUFF REPAIR AND POSSIBLE BICEP TENODESIS;  Surgeon: Christena Flake, MD;  Location: [facility];  Service: Orthopedics;  Laterality: Right;   SHOULDER ARTHROSCOPY WITH SUBACROMIAL DECOMPRESSION AND BICEP TENDON REPAIR Left 04/25/2019   Procedure: SHOULDER ARTHROSCOPY WITH DEBRIDEMENT, DECOMPRESSION AND BICEP TENDON REPAIR, ROTATOR CUFF REPAIR;  Surgeon: Christena Flake, MD;  Location: [facility];  Service: Orthopedics;  Laterality: Left;   TONSILLECTOMY      Social History:  reports that he has quit smoking. He has quit using smokeless tobacco. He reports that he does not drink alcohol and does not use drugs.  Family History:  Family History  Problem Relation Age of Onset   Angina Mother      Prior to Admission medications   Medication Sig Start Date End Date Taking? Authorizing Provider  amLODipine (NORVASC) 5 MG tablet Take by mouth. 12/26/20 12/26/21  [provider]  aspirin 81 MG chewable tablet Chew 1 tablet (81 mg total) by mouth daily. 06/15/19   Lynn Ito, MD  augmented betamethasone dipropionate (DIPROLENE-AF) 0.05 % cream Apply topically 2 (two) times daily. 03/27/22   [provider]   carvedilol (COREG) 3.125 MG tablet Take 1 tablet (3.125 mg total) by mouth 2 (two) times daily with a meal. 12/21/20   Marrion Coy, MD  colchicine 0.6 MG tablet Take by mouth. 06/30/19   [provider]  ezetimibe (ZETIA) 10 MG tablet Take 1 tablet (10 mg total) by mouth daily. 06/15/19   Lynn Ito, MD  glipiZIDE (GLUCOTROL) 10 MG tablet Take 10 mg by mouth 2 (two) times daily before a meal.    [provider]  metFORMIN (GLUCOPHAGE-XR) 500 MG 24 hr tablet Take 1,000 mg by mouth daily. 04/02/22   [provider]  nitroGLYCERIN (NITROSTAT) 0.4 MG SL tablet Take 0.4 mg by mouth every 5 (five) minutes as needed for chest pain.  03/29/14   [provider]  PRALUENT 150 MG/ML SOAJ SMARTSIG:150 Milligram(s) SUB-Q Every 2 Weeks 02/28/21   [provider]  ticagrelor (BRILINTA) 90 MG TABS tablet Take 1 tablet (90 mg total) by mouth 2 (two) times daily. 12/21/20   Marrion Coy, MD  traMADol (ULTRAM) 50 MG tablet Take 2 tablets (100 mg total) by mouth in the morning,  at noon, and at bedtime. 12/16/22 01/15/23  Yevette Edwards, MD  VASCEPA 1 g capsule Take 2 g by mouth 2 (two) times daily.    [provider]  vitamin B-12 (CYANOCOBALAMIN) 1000 MCG tablet Take 1,000 mcg by mouth daily.    [provider]    Physical Exam: Vitals:   12/17/22 1200 12/17/22 1230 12/17/22 1430 12/17/22 1615  BP: 135/63 (!) 150/70 (!) 149/66 (!) 145/70  Pulse: 80 80 83 88  Resp: 19 (!) 21 18 16   Temp:   98.1 F (36.7 C) 98 F (36.7 C)  TempSrc:   Oral Oral  SpO2: 96% 97% 94% 98%  Weight:      Height:       General: Not in acute distress HEENT:       Eyes: PERRL, EOMI, no jaundice       ENT: No discharge from the ears and nose, no pharynx injection, no tonsillar enlargement.        Neck: No JVD, no bruit, no mass felt. Heme: No neck lymph node enlargement. Cardiac: S1/S2, RRR, No murmurs, No gallops or rubs. Respiratory: No rales, wheezing, rhonchi or  rubs. GI: Soft, nondistended, nontender, no rebound pain, no organomegaly, BS present. GU: No hematuria Ext: No pitting leg edema bilaterally. 1+DP/PT pulse bilaterally.  Has tenderness, erythema and warmth to the right great toe, with a little pus draining.     Musculoskeletal: No joint deformities, No joint redness or warmth, no limitation of ROM in spin. Skin: No rashes.  Neuro: Alert, oriented X3, cranial nerves II-XII grossly intact, moves all extremities normally.  Psych: Patient is not psychotic, no suicidal or hemocidal ideation.  Labs on Admission: I have personally reviewed following labs and imaging studies  CBC: Recent Labs  Lab 12/16/22 2034  WBC 8.8  HGB 13.3  HCT 39.5  MCV 90.0  PLT 207   Basic Metabolic Panel: Recent Labs  Lab 12/16/22 2034  NA 135  K 4.7  CL 103  CO2 21*  GLUCOSE 219*  BUN 36*  CREATININE 1.78*  CALCIUM 9.1   GFR: Estimated Creatinine Clearance: 43.6 mL/min (A) (by C-G formula based on SCr of 1.78 mg/dL (H)). Liver Function Tests: Recent Labs  Lab 12/16/22 2034  AST 22  ALT 26  ALKPHOS 85  BILITOT 0.7  PROT 7.2  ALBUMIN 4.2   Recent Labs  Lab 12/16/22 2034  LIPASE 80*   No results for input(s): "AMMONIA" in the last 168 hours. Coagulation Profile: Recent Labs  Lab 12/17/22 1146  INR 1.0   Cardiac Enzymes: No results for input(s): "CKTOTAL", "CKMB", "CKMBINDEX", "TROPONINI" in the last 168 hours. BNP (last 3 results) No results for input(s): "PROBNP" in the last 8760 hours. HbA1C: No results for input(s): "HGBA1C" in the last 72 hours. CBG: Recent Labs  Lab 12/17/22 1116 12/17/22 1630  GLUCAP 305* 348*   Lipid Profile: No results for input(s): "CHOL", "HDL", "LDLCALC", "TRIG", "CHOLHDL", "LDLDIRECT" in the last 72 hours. Thyroid Function Tests: No results for input(s): "TSH", "T4TOTAL", "FREET4", "T3FREE", "THYROIDAB" in the last 72 hours. Anemia Panel: No results for input(s): "VITAMINB12", "FOLATE",  "FERRITIN", "TIBC", "IRON", "RETICCTPCT" in the last 72 hours. Urine analysis:    Component Value Date/Time   COLORURINE STRAW (A) 02/24/2015 1340   APPEARANCEUR CLEAR (A) 02/24/2015 1340   APPEARANCEUR CLEAR 09/12/2014 0842   LABSPEC 1.011 02/24/2015 1340   LABSPEC 1.019 09/12/2014 0842   PHURINE 5.0 02/24/2015 1340   GLUCOSEU NEGATIVE 02/24/2015 1340  GLUCOSEU NEGATIVE 09/12/2014 0842   HGBUR NEGATIVE 02/24/2015 1340   BILIRUBINUR NEGATIVE 02/24/2015 1340   BILIRUBINUR NEGATIVE 09/12/2014 0842   KETONESUR NEGATIVE 02/24/2015 1340   PROTEINUR NEGATIVE 02/24/2015 1340   NITRITE NEGATIVE 02/24/2015 1340   LEUKOCYTESUR NEGATIVE 02/24/2015 1340   LEUKOCYTESUR NEGATIVE 09/12/2014 0842   Sepsis Labs: @LABRCNTIP (procalcitonin:4,lacticidven:4) ) Recent Results (from the past 240 hour(s))  Aerobic/Anaerobic Culture w Gram Stain (surgical/deep wound)     Status: None (Preliminary result)   Collection Time: 12/17/22 12:22 AM   Specimen: Toe; Wound  Result Value Ref Range Status   Specimen Description   Final    TOE RIGHT Performed at [facility], [address], [place], [place] [address]    Special Requests   Final    Normal Performed at [facility], [address] [place], [place] [address]    Gram Stain   Final    RARE WBC PRESENT, PREDOMINANTLY PMN FEW GRAM POSITIVE COCCI FEW GRAM NEGATIVE RODS Performed at [facility], [address], [place], [place] [address]    Culture PENDING  Incomplete   Report Status PENDING  Incomplete     Radiological Exams on Admission: DG Toe Great Right  Result Date: 12/16/2022 CLINICAL DATA:  Right great toe pain EXAM: RIGHT GREAT TOE COMPARISON:  None Available. FINDINGS: Normal alignment. No acute fracture or dislocation. No osseous erosions or abnormal periosteal reaction. 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 There is  diffuse soft tissue swelling of the great toe, more severe surrounding the distal phalanx. Ulcer noted along the dorsomedial aspect of the great toe. No subcutaneous gas or retained radiopaque foreign body identified. IMPRESSION: 1. Soft tissue swelling and ulceration. No subcutaneous gas or retained radiopaque foreign body. 2. No radiographic evidence of osteomyelitis. Electronically Signed   By: Helyn Numbers M.D.   On: 12/16/2022 21:27   DG Chest 2 View  Result Date: 12/16/2022 CLINICAL DATA:  Chest pain EXAM: CHEST - 2 VIEW COMPARISON:  12/20/2020 FINDINGS: The heart size and mediastinal contours are within normal limits. Both lungs are clear. The visualized skeletal structures are unremarkable. IMPRESSION: No active cardiopulmonary disease. Electronically Signed   By: Helyn Numbers M.D.   On: 12/16/2022 21:25      Assessment/Plan Principal Problem:   Cellulitis of great toe of right foot Active Problems:   CAD (coronary artery disease)   Chest pain   Acute renal failure superimposed on stage 3a chronic kidney disease (HCC)   HTN (hypertension)   Hyperlipidemia, mixed   Chronic systolic CHF (congestive heart failure) (HCC)   Type II diabetes mellitus with renal manifestations (HCC)   Obesity (BMI 30-39.9)   Assessment and Plan:  Cellulitis of great toe of right foot: Patient does not have fever or leukocytosis.  Clinically does not seem to have sepsis.   will admit to tele bed as inpatient - Empiric antimicrobial treatment with vancomycin, Flagyl, Rocephin - PRN Zofran for nausea, morphine and Percocet for pain - Blood cultures x 2  - ESR and CRP - IVF: 1.5 L of NS bolus  -consulted Dr. Ether Griffins of podiatry  CAD (coronary artery disease) and CP: His chest pain has subsided.  Troponin negative x 3.  Possibly due to demand ischemia.  Patient had elevated blood pressure at home. -As needed nitroglycerin, morphine -Aspirin, Zetia -trend trop  -check A1c and FLP  Acute renal  failure superimposed on stage  3a chronic kidney disease (HCC): Baseline creatinine 0.97 on 12/21/2020.  His creatinine is 1.78, BUN 36, GFR 39 today. -IV fluid as above -Avoid using renal toxic medications  HTN (hypertension): IV hydralazine as needed -Amlodipine, Coreg  Hyperlipidemia, mixed -Zetia -Patient is also on Repatha at home  Chronic systolic CHF (congestive heart failure) (HCC): 2D echo on 10/01/2021 showed EF of 40%.  Patient does not have leg edema or JVD.  CHF seem to be compensated. -Check BNP --> 24.8  Type II diabetes mellitus with renal manifestations [facility]): Recent A1c 8.1, poorly controlled.  Patient is taking glipizide and metformin at home.  Patient refused insulin initially, but agreed to take insulin later on.  He strongly wants to take his metformin -SSI -Metformin  Obesity (BMI 30-39.9): Body weight while 8.9 kg, BMI 32.55 -Encourage losing weight -Exercise and healthy diet     DVT ppx: SCD  Code Status: Full code   Family Communication: I offered to call his family, but patient states that I do not need to call his family since he already called his son who knows what is going on for him.  Disposition Plan:  Anticipate discharge back to previous environment  Consults called:  Consulted Dr. Ether Griffins of podiatry  Admission status and Level of care: Telemetry Cardiac:     as inpt       Dispo: The patient is from: Home              Anticipated d/c is to: Home              Anticipated d/c date is: 2 days              Patient currently is not medically stable to d/c.    Severity of Illness:  The appropriate patient status for this patient is INPATIENT. Inpatient status is judged to be reasonable and necessary in order to provide the required intensity of service to ensure the patient's safety. The patient's presenting symptoms, physical exam findings, and initial radiographic and laboratory data in the context of their chronic comorbidities is felt to  place them at high risk for further clinical deterioration. Furthermore, it is not anticipated that the patient will be medically stable for discharge from the hospital within 2 midnights of admission.   * I certify that at the point of admission it is my clinical judgment that the patient will require inpatient hospital care spanning beyond 2 midnights from the point of admission due to high intensity of service, high risk for further deterioration and high frequency of surveillance required.*       Date of Service 12/17/2022    Lorretta Harp [facility]   If 7PM-7AM, please contact night-coverage [email or web address] 12/17/2022, 4:44 PM

## 2022-12-18 ENCOUNTER — Other Ambulatory Visit (HOSPITAL_COMMUNITY): Payer: Self-pay

## 2022-12-18 DIAGNOSIS — L03031 Cellulitis of right toe: Secondary | ICD-10-CM | POA: Diagnosis not present

## 2022-12-18 LAB — GLUCOSE, CAPILLARY
Glucose-Capillary: 196 mg/dL — ABNORMAL HIGH (ref 70–99)
Glucose-Capillary: 229 mg/dL — ABNORMAL HIGH (ref 70–99)
Glucose-Capillary: 247 mg/dL — ABNORMAL HIGH (ref 70–99)
Glucose-Capillary: 203 mg/dL — ABNORMAL HIGH (ref 70–99)
Glucose-Capillary: 254 mg/dL — ABNORMAL HIGH (ref 70–99)

## 2022-12-18 LAB — LIPID PANEL
Cholesterol: 60 mg/dL (ref 0–200)
HDL: 30 mg/dL — ABNORMAL LOW (ref >40)
Total CHOL/HDL Ratio: 2 RATIO
Triglycerides: 245 mg/dL — ABNORMAL HIGH (ref <150)
VLDL: 49 mg/dL — ABNORMAL HIGH (ref 0–40)

## 2022-12-18 LAB — BASIC METABOLIC PANEL
Anion gap: 9 (ref 5–15)
BUN: 29 mg/dL — ABNORMAL HIGH (ref 8–23)
CO2: 22 mmol/L (ref 22–32)
Calcium: 8.6 mg/dL — ABNORMAL LOW (ref 8.9–10.3)
Chloride: 108 mmol/L (ref 98–111)
Creatinine, Ser: 1.26 mg/dL — ABNORMAL HIGH (ref 0.61–1.24)
GFR, Estimated: 58 mL/min — ABNORMAL LOW (ref >60)
Glucose, Bld: 210 mg/dL — ABNORMAL HIGH (ref 70–99)
Potassium: 4.5 mmol/L (ref 3.5–5.1)
Sodium: 139 mmol/L (ref 135–145)

## 2022-12-18 LAB — CBC
HCT: 39.2 % (ref 39.0–52.0)
Hemoglobin: 12.9 g/dL — ABNORMAL LOW (ref 13.0–17.0)
MCH: 30.3 pg (ref 26.0–34.0)
MCHC: 32.9 g/dL (ref 30.0–36.0)
MCV: 92 fL (ref 80.0–100.0)
Platelets: 185 10*3/uL (ref 150–400)
RBC: 4.26 MIL/uL (ref 4.22–5.81)
RDW: 15.5 % (ref 11.5–15.5)
WBC: 8.7 10*3/uL (ref 4.0–10.5)
nRBC: 0 % (ref 0.0–0.2)

## 2022-12-18 LAB — HEMOGLOBIN A1C
Hgb A1c MFr Bld: 8.2 % — ABNORMAL HIGH (ref 4.8–5.6)
Mean Plasma Glucose: 189 mg/dL

## 2022-12-18 LAB — CULTURE, BLOOD (ROUTINE X 2)

## 2022-12-18 MED ORDER — AMLODIPINE BESYLATE 10 MG PO TABS
10.0000 mg | ORAL_TABLET | Freq: Every day | ORAL | Status: DC
  Administered 2022-12-19: 10 mg via ORAL
  Filled 2022-12-18: qty 1

## 2022-12-18 MED ORDER — VANCOMYCIN HCL 1750 MG/350ML IV SOLN
1750.0000 mg | INTRAVENOUS | Status: DC
  Administered 2022-12-18: 1750 mg via INTRAVENOUS
  Filled 2022-12-18 (×2): qty 350

## 2022-12-18 MED ORDER — CARVEDILOL 6.25 MG PO TABS
6.2500 mg | ORAL_TABLET | Freq: Two times a day (BID) | ORAL | Status: DC
  Administered 2022-12-18 – 2022-12-19 (×2): 6.25 mg via ORAL
  Filled 2022-12-18 (×2): qty 1

## 2022-12-18 NOTE — TOC Benefit Eligibility Note (Addendum)
Pharmacy Patient Advocate Encounter  Insurance verification completed.    The patient is insured through [employer] Part D  Ran test claim for linezolid (Zyvox) 600 mg tablets and 7 days copay is $0.00   This test claim was processed through [employer]- copay amounts may vary at other pharmacies due to [employer], or as the patient moves through the different stages of their insurance plan.    Roland Earl, CPHT Pharmacy Patient Advocate Specialist [facility] Patient Advocate Team Direct Number: [phone]  Fax: [phone]

## 2022-12-18 NOTE — Consult Note (Signed)
Pharmacy Antibiotic Note  ASSESSMENT: 77 y.o. male with PMH cellulitis, DM, CAD (NSTEMI 2020 s/p stent) is presenting with  diabetic foot infection of right great toe . Imaging notable for soft tissue swelling and ulceration but no evidence of osteomyelitis. Pharmacy has been consulted to manage vancomycin. Patient's last known baseline serum creatinine of 0.97 in 12/2020.    PLAN: Patient's renal function has improved to near baseline. Will transition to AUC dosing Vancomycin 1750 mg IV Q 24 hrs. Goal AUC 400-550. Expected AUC: 461 Expected Cmin: 11.2 SCr used: 1.26, Vd used: 0.72  Follow up culture results to assess for antibiotic optimization. Monitor renal function to assess for any necessary antibiotic dosing changes.  Patient measurements: Height: 6' (182.9 cm) Weight: 108.9 kg (240 lb) IBW/kg (Calculated) : 77.6  Vital signs: Temp: 98.4 F (36.9 C) (06/28 1154) Temp Source: Oral (06/28 1154) BP: 144/71 (06/28 1154) Pulse Rate: 86 (06/28 1154) Recent Labs  Lab 12/16/22 2034 12/18/22 0415  WBC 8.8 8.7  CREATININE 1.78* 1.26*    Estimated Creatinine Clearance: 61.6 mL/min (A) (by C-G formula based on SCr of 1.26 mg/dL (H)).  Allergies: Allergies  Allergen Reactions   Ace Inhibitors Other (See Comments)    weakness   Isosorbide     weakness   Losartan     weakness   Metoprolol Tartrate Other (See Comments)    weakness   Statins Nausea And Vomiting and Other (See Comments)    Patient states he gets real weak and headache.   Sulfa Antibiotics Other (See Comments)    Unknown childhood reaction     Antimicrobials this admission: Ceftriaxone 6/27 >> Vancomycin 6/27 >> Metronidazole 6/27 >>  Dose adjustments this admission: N/A  Microbiology results: 6/27 BCx: sent 6/27 Wound culture: Few GPCs, Few GNRs      Thank you for allowing pharmacy to be a part of this patient's care.  Bettey Costa, PharmD Clinical Pharmacist 12/18/2022 2:02 PM

## 2022-12-18 NOTE — Progress Notes (Signed)
[facility]  - [place] at [facility]   PATIENT NAME: Aaron [name]    MR#:  [id number]  DATE OF BIRTH:  [birth year]  SUBJECTIVE:  no family at bedside. Patient came in with some chest discomfort and elevated blood pressure. He also noted right great toe infection after he pulled ingrown nail at home. Denies any chest pain. Blood pressure relatively stable. Denies any pain in the right foot    VITALS:  Blood pressure (!) 144/71, pulse 86, temperature 98.4 F (36.9 C), temperature source Oral, resp. rate 18, height 6' (1.829 m), weight 108.9 kg, SpO2 98 %.  PHYSICAL EXAMINATION:   GENERAL:  77 y.o.-year-old patient with no acute distress.  LUNGS: Normal breath sounds bilaterally, no wheezing CARDIOVASCULAR: S1, S2 normal. No murmur   ABDOMEN: Soft, nontender, nondistended. Bowel sounds present.  EXTREMITIES:   NEUROLOGIC: nonfocal  patient is alert and awake   LABORATORY PANEL:  CBC Recent Labs  Lab 12/18/22 0415  WBC 8.7  HGB 12.9*  HCT 39.2  PLT 185    Chemistries  Recent Labs  Lab 12/16/22 2034 12/18/22 0415  NA 135 139  K 4.7 4.5  CL 103 108  CO2 21* 22  GLUCOSE 219* 210*  BUN 36* 29*  CREATININE 1.78* 1.26*  CALCIUM 9.1 8.6*  AST 22  --   ALT 26  --   ALKPHOS 85  --   BILITOT 0.7  --    Cardiac Enzymes No results for input(s): "TROPONINI" in the last 168 hours. RADIOLOGY:  DG Toe Great Right  Result Date: 12/16/2022 CLINICAL DATA:  Right great toe pain EXAM: RIGHT GREAT TOE COMPARISON:  None Available. FINDINGS: Normal alignment. No acute fracture or dislocation. No osseous erosions or abnormal periosteal reaction. 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 There is diffuse soft tissue swelling of the great toe, more severe surrounding the distal phalanx. Ulcer noted along the dorsomedial aspect of the great toe. No subcutaneous gas or retained radiopaque  foreign body identified. IMPRESSION: 1. Soft tissue swelling and ulceration. No subcutaneous gas or retained radiopaque foreign body. 2. No radiographic evidence of osteomyelitis. Electronically Signed   By: Helyn Numbers M.D.   On: 12/16/2022 21:27   DG Chest 2 View  Result Date: 12/16/2022 CLINICAL DATA:  Chest pain EXAM: CHEST - 2 VIEW COMPARISON:  12/20/2020 FINDINGS: The heart size and mediastinal contours are within normal limits. Both lungs are clear. The visualized skeletal structures are unremarkable. IMPRESSION: No active cardiopulmonary disease. Electronically Signed   By: Helyn Numbers M.D.   On: 12/16/2022 21:25    Assessment and Plan Aaron [name]. is a 77 y.o. male with medical history significant of HTN, HLD, DM, CAD, s/p of stent, dCHF, gout, CKD-3a, present with right great toe pain and chest pain.   Pt states that he pulled his right big toenail 5 weeks ago himself due to ingrown toenail.  He has been inserting a metal as shaped hook in the wound to keep it open and draining He also reports elevated blood pressure, with SBP up to 190 at home. He reports chest pain.   Cellulitis of great toe of right foot: Patient does not have fever or leukocytosis.  Clinically does not seem to have sepsis.  - Empiric antimicrobial treatment with vancomycin, Flagyl, Rocephin--change to IV vancomycin - PRN Zofran for nausea, morphine and Percocet for pain - Blood cultures x 2 --negative -  consulted Dr. Ether Griffins of podiatry--Orders were written for Betadine dressings to be performed. Recommend just broad-spectrum antibiotics upon discharge. Currently gram-positive cocci and gram-negative rods on preliminary culture. I recommend patient follow-up in 2 weeks in the outpatient clinic with podiatry.    CAD (coronary artery disease) and CP: His chest pain has subsided.  Troponin negative x 3.  Possibly due to demand ischemia.  Patient had elevated blood pressure at home. -As needed nitroglycerin,  morphine -Aspirin, Zetia -trend trop x4 negative -- patient requested cardiology consultation with [facility]. Adjusted blood pressure meds. Patient is chest pain free. -- He will follow-up cardiology next week.   Acute renal failure superimposed on stage 3a chronic kidney disease (HCC): Baseline creatinine 0.97 on 12/21/2020.  His creatinine is 1.78, BUN 36, GFR 39 today. -IV fluid as above--creat trending down -Avoid using renal toxic medications   HTN (hypertension): IV hydralazine as needed -Amlodipine, Coreg--dose adjusted   Hyperlipidemia, mixed -Zetia -Patient is also on Repatha at home   Chronic systolic CHF (congestive heart failure) (HCC): 2D echo on 10/01/2021 showed EF of 40%.  Patient does not have leg edema or JVD.  CHF seem to be compensated. -Check BNP --> 24.8   Type II diabetes mellitus with renal manifestations [facility]): Recent A1c 8.1, poorly controlled.  Patient is taking glipizide and metformin at home.  Patient refused insulin initially, but agreed to take insulin later on.  He strongly wants to take his metformin -SSI -Metformin   Obesity (BMI 30-39.9): Body weight while 8.9 kg, BMI 32.55 -Encourage losing weight -Exercise and healthy diet         DVT ppx: SCD   Code Status: Full code   Family communication :pt says family is informed Consults :Podiatry CODE STATUS: FULL DVT Prophylaxis :scd Level of care: Telemetry Cardiac Status is: Inpatient Remains inpatient appropriate because: awaiting WC.     TOTAL TIME TAKING CARE OF THIS PATIENT: 35 minutes.  >50% time spent on counselling and coordination of care  Note: This dictation was prepared with Dragon dictation along with smaller phrase technology. Any transcriptional errors that result from this process are unintentional.  Enedina Finner M.D    [facility]   CC: Primary care physician; Lonie Peak, [name]

## 2022-12-18 NOTE — Consult Note (Signed)
[facility] CLINIC CARDIOLOGY CONSULT NOTE       Patient ID: Aaron [name]. MRN: [id number] DOB/AGE: 06/27/1945 77 y.o.  Admit date: 12/16/2022 Referring Physician Dr. Enedina Finner Primary Physician Primary Cardiologist Minda Ditto, [name] Reason for Consultation chest [name]  HPI: Aaron [name]. Aaron [name] is a 77yoM with a PMH of 3vCAD s/p PCI w/ DES mid LCX & prox-mid LAD (12/20/2020), distal RCA stent  (06/13/2019), and mid RCA stent (10/21/2015), HFrEF (EF 40% 10/01/2021) HLD w/ statin intolerance, HTN, DM2, who presented to [facility] ED 12/16/2022 with concerns regarding labile blood pressures and associated presyncopal symptoms, chest [name], and a draining wound on his right great toe x 5 weeks after he removed an ingrown toenail.  He is being treated for cellulitis of his right foot, cardiology is consulted on hospital day 2 at the patient's request.  The patient states that for several days prior to his presentation to the emergency department his blood pressure has been labile.  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  He usually takes amlodipine 5 mg daily and Coreg 3.125 mg twice daily, he called cardiology office earlier this week with these concerns and also associated lightheadedness and dizziness and was referred immediately to the emergency department.  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  He denies current chest [name] or pressure.  He does not need to take SL nitroglycerin on a regular basis, and has not needed to take it for several months.  He notes that occasionally he will feel short of breath when he walks to the  mailbox and need to stop and take a breath before continuing on, but also states that sometimes he can walk and do activity like normal without difficulty.  Overall, he has not noticed persistent decline in his functional status or exertional chest discomfort on a regular basis.  He continues to be concerned regarding his blood pressure fluctuations and this is why he presented to the hospital.  Fortunately, troponins x 3 were negative at 10, 10, 10, and EKG demonstrated sinus rhythm with nonspecific T wave changes that were actually identical to his prior EKG from 12/21/2020.  The patient also states that occasionally he will have some "different" substernal chest discomfort that he feels is like "reflux" and always gets better after he eats or drinks something.  This is explicitly different than his prior anginal chest [name] which he describes as a pressure.  He also mentioned to the ED physician that 5 weeks ago he pulled a ingrown toenail out of his right great toe and has had purulent drainage and increased swelling on his foot over the past several weeks.  Since his admission, he has been on broad-spectrum antibiotics for treatment of cellulitis of his right great toe and was seen by podiatry yesterday evening who cleansed his wound and applied a clean dressing with further recommendations for continued antibiotic therapy and outpatient follow-up in 2 weeks.  Review of systems complete and found to be negative unless listed above     Past Medical History:  Diagnosis Date   Cellulitis  Diabetes mellitus without complication (HCC)    MI, old     Past Surgical History:  Procedure Laterality Date   ABDOMINAL SURGERY     APPENDECTOMY     CARDIAC CATHETERIZATION Left 10/21/2015   Procedure: Left Heart Cath and Coronary Angiography;  Surgeon: Lamar Blinks, MD;  Location: [facility] INVASIVE CV LAB;  Service: Cardiovascular;  Laterality: Left;   CARDIAC CATHETERIZATION N/A 10/21/2015   Procedure:  Coronary Stent Intervention;  Surgeon: Marcina Millard, MD;  Location: [facility] INVASIVE CV LAB;  Service: Cardiovascular;  Laterality: N/A;   CHOLECYSTECTOMY     CORONARY ANGIOPLASTY WITH STENT PLACEMENT     CORONARY STENT INTERVENTION N/A 06/13/2019   Procedure: CORONARY STENT INTERVENTION;  Surgeon: Marcina Millard, MD;  Location: [facility] INVASIVE CV LAB;  Service: Cardiovascular;  Laterality: N/A;   CORONARY STENT INTERVENTION N/A 12/20/2020   Procedure: CORONARY STENT INTERVENTION;  Surgeon: Iran Ouch, MD;  Location: [facility] INVASIVE CV LAB;  Service: Cardiovascular;  Laterality: N/A;   LEFT HEART CATH AND CORONARY ANGIOGRAPHY Left 10/07/2016   Procedure: Left Heart Cath and Coronary Angiography;  Surgeon: Lamar Blinks, MD;  Location: [facility] INVASIVE CV LAB;  Service: Cardiovascular;  Laterality: Left;   LEFT HEART CATH AND CORONARY ANGIOGRAPHY N/A 06/13/2019   Procedure: LEFT HEART CATH AND CORONARY ANGIOGRAPHY;  Surgeon: Lamar Blinks, MD;  Location: [facility] INVASIVE CV LAB;  Service: Cardiovascular;  Laterality: N/A;   LEFT HEART CATH AND CORONARY ANGIOGRAPHY N/A 12/20/2020   Procedure: LEFT HEART CATH AND CORONARY ANGIOGRAPHY;  Surgeon: Iran Ouch, MD;  Location: [facility] INVASIVE CV LAB;  Service: Cardiovascular;  Laterality: N/A;   SHOULDER ARTHROSCOPY WITH SUBACROMIAL DECOMPRESSION AND BICEP TENDON REPAIR Right 02/24/2018   Procedure: SHOULDER ARTHROSCOPY WITH SUBACROMIAL DEBRIDEMENT, DECOMPRESSION, ROTATOR CUFF REPAIR AND POSSIBLE BICEP TENODESIS;  Surgeon: Christena Flake, MD;  Location: [facility] ORS;  Service: Orthopedics;  Laterality: Right;   SHOULDER ARTHROSCOPY WITH SUBACROMIAL DECOMPRESSION AND BICEP TENDON REPAIR Left 04/25/2019   Procedure: SHOULDER ARTHROSCOPY WITH DEBRIDEMENT, DECOMPRESSION AND BICEP TENDON REPAIR, ROTATOR CUFF REPAIR;  Surgeon: Christena Flake, MD;  Location: [facility] ORS;  Service: Orthopedics;  Laterality: Left;   TONSILLECTOMY      Medications Prior to  Admission  Medication Sig Dispense Refill Last Dose   amLODipine (NORVASC) 5 MG tablet Take 5 mg by mouth daily.   12/16/2022   aspirin 81 MG chewable tablet Chew 1 tablet (81 mg total) by mouth daily.   Past Week   augmented betamethasone dipropionate (DIPROLENE-AF) 0.05 % cream Apply topically 2 (two) times daily.   Past Week   carvedilol (COREG) 3.125 MG tablet Take 1 tablet (3.125 mg total) by mouth 2 (two) times daily with a meal. 60 tablet 0 12/16/2022   colchicine 0.6 MG tablet Take 0.6 mg by mouth as directed.   unknown at prn   ezetimibe (ZETIA) 10 MG tablet Take 1 tablet (10 mg total) by mouth daily. 30 tablet 0 12/16/2022   glipiZIDE (GLUCOTROL) 10 MG tablet Take 10 mg by mouth 2 (two) times daily before a meal.   12/16/2022   indomethacin (INDOCIN) 50 MG capsule Take 50 mg by mouth 3 (three) times daily as needed for moderate [name].   unknown at prn   metFORMIN (GLUCOPHAGE-XR) 500 MG 24 hr tablet Take 1,000 mg by mouth daily.   12/16/2022   nitroGLYCERIN (NITROSTAT) 0.4 MG SL tablet Take 0.4 mg by mouth every 5 (five) minutes as needed for chest [name].  unknown at prn   REPATHA SURECLICK 140 MG/ML SOAJ Inject 140 mg into the skin every 14 (fourteen) days.   12/11/2022   traMADol (ULTRAM) 50 MG tablet Take 2 tablets (100 mg total) by mouth in the morning, at noon, and at bedtime. 180 tablet 2 12/16/2022   VASCEPA 1 g capsule Take 2 g by mouth 2 (two) times daily.   12/16/2022   vitamin B-12 (CYANOCOBALAMIN) 1000 MCG tablet Take 1,000 mcg by mouth daily.   12/16/2022   Social History   Socioeconomic History   Marital status: Widowed    Spouse name: Not on file   Number of children: Not on file   Years of education: Not on file   Highest education level: Not on file  Occupational History   Not on file  Tobacco Use   Smoking status: Former   Smokeless tobacco: Former   Tobacco comments:    7/20 quit 35 years ago  Vaping Use   Vaping Use: Never used  Substance and Sexual Activity    Alcohol use: No   Drug use: No   Sexual activity: Not on file  Other Topics Concern   Not on file  Social History Narrative   Not on file   Social Determinants of Health   Financial Resource Strain: Not on file  Food Insecurity: No Food Insecurity (12/17/2022)   Hunger Vital Sign    Worried About Running Out of Food in the Last Year: Never true    Ran Out of Food in the Last Year: Never true  Transportation Needs: No Transportation Needs (12/17/2022)   PRAPARE - Administrator, Civil Service (Medical): No    Lack of Transportation (Non-Medical): No  Physical Activity: Not on file  Stress: Not on file  Social Connections: Not on file  Intimate Partner Violence: Not At Risk (12/17/2022)   Humiliation, Afraid, Rape, and Kick questionnaire    Fear of Current or Ex-Partner: No    Emotionally Abused: No    Physically Abused: No    Sexually Abused: No    Family History  Problem Relation Age of Onset   Angina Mother       Intake/Output Summary (Last 24 hours) at 12/18/2022 1401 Last data filed at 12/18/2022 1309 Gross per 24 hour  Intake 997.37 ml  Output 1350 ml  Net -352.63 ml    Vitals:   12/17/22 1946 12/17/22 2309 12/18/22 0753 12/18/22 1154  BP: (!) 142/67 (!) 150/72 (!) 144/81 (!) 144/71  Pulse: 85 93 85 86  Resp: 18 16 16 18   Temp: 98.2 F (36.8 C) 98.4 F (36.9 C) 98.1 F (36.7 C) 98.4 F (36.9 C)  TempSrc: Oral Oral  Oral  SpO2: 98% 96% 99% 98%  Weight:      Height:        PHYSICAL EXAM General: Well-appearing, conversational elderly Caucasian male, well nourished, in no acute distress.  Laying in ankle in bed. HEENT:  Normocephalic and atraumatic. Neck:  No JVD.  Lungs: Normal respiratory effort on room air. Clear bilaterally to auscultation. No wheezes, crackles, rhonchi.  Heart: HRRR . Normal S1 and S2 without gallops or murmurs.  Abdomen: Non-distended appearing with excess adiposity.  Msk: Normal strength and tone for age. Extremities:  No significant lower extremity edema, right great toe wrapped in bandage. Neuro: Alert and oriented X 3. Psych:  Answers questions appropriately.   Labs: Basic Metabolic Panel: Recent Labs    12/16/22 2034 12/18/22 0415  NA  135 139  K 4.7 4.5  CL 103 108  CO2 21* 22  GLUCOSE 219* 210*  BUN 36* 29*  CREATININE 1.78* 1.26*  CALCIUM 9.1 8.6*   Liver Function Tests: Recent Labs    12/16/22 2034  AST 22  ALT 26  ALKPHOS 85  BILITOT 0.7  PROT 7.2  ALBUMIN 4.2   Recent Labs    12/16/22 2034  LIPASE 80*   CBC: Recent Labs    12/16/22 2034 12/18/22 0415  WBC 8.8 8.7  HGB 13.3 12.9*  HCT 39.5 39.2  MCV 90.0 92.0  PLT 207 185   Cardiac Enzymes: Recent Labs    12/17/22 0023 12/17/22 1118 12/17/22 1146  TROPONINIHS 10 10 10    BNP: Recent Labs    12/17/22 1118  BNP 24.8   D-Dimer: No results for input(s): "DDIMER" in the last 72 hours. Hemoglobin A1C: Recent Labs    12/17/22 1118  HGBA1C 8.2*   Fasting Lipid Panel: Recent Labs    12/18/22 0415  CHOL 60  HDL 30*  LDLCALC NOT CALCULATED  TRIG 245*  CHOLHDL 2.0   Thyroid Function Tests: No results for input(s): "TSH", "T4TOTAL", "T3FREE", "THYROIDAB" in the last 72 hours.  Invalid input(s): "FREET3" Anemia Panel: No results for input(s): "VITAMINB12", "FOLATE", "FERRITIN", "TIBC", "IRON", "RETICCTPCT" in the last 72 hours.   Radiology: DG Toe Great Right  Result Date: 12/16/2022 CLINICAL DATA:  Right great toe [name] EXAM: RIGHT GREAT TOE COMPARISON:  None Available. FINDINGS: Normal alignment. No acute fracture or dislocation. No osseous erosions or abnormal periosteal reaction. 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 There is diffuse soft tissue swelling of the great toe, more severe surrounding the distal phalanx. Ulcer noted along the dorsomedial aspect of the great toe. No subcutaneous gas or retained radiopaque  foreign body identified. IMPRESSION: 1. Soft tissue swelling and ulceration. No subcutaneous gas or retained radiopaque foreign body. 2. No radiographic evidence of osteomyelitis. Electronically Signed   By: Helyn Numbers M.D.   On: 12/16/2022 21:27   DG Chest 2 View  Result Date: 12/16/2022 CLINICAL DATA:  Chest [name] EXAM: CHEST - 2 VIEW COMPARISON:  12/20/2020 FINDINGS: The heart size and mediastinal contours are within normal limits. Both lungs are clear. The visualized skeletal structures are unremarkable. IMPRESSION: No active cardiopulmonary disease. Electronically Signed   By: Helyn Numbers M.D.   On: 12/16/2022 21:25    LHC 12/20/2020 -Dr. Kirke Corin   There is mild left ventricular systolic dysfunction. LV end diastolic pressure is mildly elevated. The left ventricular ejection fraction is 45-50% by visual estimate. Mid LM to Dist LM lesion is 20% stenosed. Ost LAD lesion is 30% stenosed. Mid LAD lesion is 10% stenosed. Mid LAD to Dist LAD lesion is 30% stenosed. Prox Cx to Mid Cx lesion is 60% stenosed. Mid Cx to Dist Cx lesion is 99% stenosed. A drug-eluting stent was successfully placed using a STENT RESOLUTE ONYX [id number]. Post intervention, there is a 0% residual stenosis. Post intervention, there is a 0% residual stenosis. Prox LAD to Mid LAD lesion is 80% stenosed. Post intervention, there is a 0% residual stenosis. A drug-eluting stent was successfully placed using a STENT RESOLUTE ONYX 3.0X12. Prox RCA lesion is 50% stenosed. Prox RCA to Mid RCA lesion is 10% stenosed. Previously placed Dist RCA stent (unknown type) is widely patent.   1.  Significant underlying three-vessel coronary artery disease with patent RCA stents with  minimal restenosis.  There is moderate proximal RCA stenosis.  The mid LAD stent is patent but there is significant stenosis proximal to the stent.  In addition, there is severe mid left circumflex disease which seems to be the culprit for non-ST elevation  myocardial infarction. 2.  Mildly reduced LV systolic function with mildly elevated left ventricular end-diastolic pressure. 3.  Successful angioplasty and drug-eluting stent placement to the left circumflex as well as LAD.   Recommendations: Dual antiplatelet therapy for at least 12 years and consider longer given multiple stents. Aggressive treatment of risk factors.  Consider alternative hyperlipidemia therapy given intolerance to statins. Will start small dose carvedilol for elevated blood pressure. The patient will be continued on nitroglycerin drip until his chest [name] is resolved. Possible discharge home tomorrow if stable.   LHC 06/13/2019 -Dr. Darrold Junker Mid LAD lesion is 15% stenosed. 1st Diag lesion is 90% stenosed. Ost Cx to Prox Cx lesion is 25% stenosed. Mid Cx to Dist Cx lesion is 60% stenosed. Prox Cx to Mid Cx lesion is 70% stenosed. Mid RCA lesion is 10% stenosed. Dist RCA lesion is 99% stenosed. A drug-eluting stent was successfully placed using a STENT RESOLUTE ONYX 2.5X15. Post intervention, there is a 0% residual stenosis.   1.  Non-ST elevation myocardial infarction 2.  High-grade 95% stenosis distal RCA likely culprit lesion 3.  Successful PCI with DES distal RCA  # Lexiscan Myoview 10/01/2021 Normal Lexiscan infusion EKG  Mild segmental LV systolic dysfunction with ejection fraction of 39%  Moderate perfusion abnormality of mild intensity of the inferior wall  consistent with previous infarct and/or scar without evidence of  myocardial ischemia   Bruce Gwen Pounds   ECHO 10/01/2021 MILD LV SYSTOLIC DYSFUNCTION (See above)  NORMAL RIGHT VENTRICULAR SYSTOLIC FUNCTION  TRIVIAL REGURGITATION NOTED (See above)  NO VALVULAR STENOSIS  TRIVIAL MR, TR  EF 40%  _________________________________________________________________________________________  Electronically signed by      MD Arnoldo Hooker on 10/01/2021 01: 52 PM           Performed By: Merla Riches,  RDCS     Ordering Physician: Luna Fuse reviewed by me (LT) 12/18/2022 : NSR low voltage rate 70s to 80s  EKG reviewed by me: Sinus rhythm with nonspecific T wave changes without interval change from 12/2020  Data reviewed by me (LT) 12/18/2022: Last outpatient cardiology note, ED physician note, admission H&P, podiatry note last 24h vitals tele labs imaging I/O   Principal Problem:   Cellulitis of great toe of right foot Active Problems:   CAD (coronary artery disease)   Chest [name]   Hyperlipidemia, mixed   Chronic systolic CHF (congestive heart failure) (HCC)   Obesity (BMI 30-39.9)   Acute renal failure superimposed on stage 3a chronic kidney disease (HCC)   Type II diabetes mellitus with renal manifestations (HCC)   HTN (hypertension)    ASSESSMENT AND PLAN:  Aaron [name]. Aaron [name] is a 63yoM with a PMH of 3vCAD s/p PCI w/ DES mid LCX & prox-mid LAD (12/20/2020), distal RCA stent  (06/13/2019), and mid RCA stent (10/21/2015), HFrEF (EF 40% 10/01/2021) HLD w/ statin intolerance, HTN, DM2, who presented to [facility] ED 12/16/2022 with concerns regarding labile blood pressures and associated presyncopal symptoms, chest [name], and a draining wound on his right great toe x 5 weeks after he removed an ingrown toenail.  He is being treated for cellulitis of his right foot, cardiology is consulted on hospital day 2 at the patient's request.  #  Right lower extremity cellulitis # Hypertension Presented with labile blood pressures and presyncopal symptoms in the setting of worsening right great toe paronychia that progressed to cellulitis of the right foot over 5 weeks.  Suspect patient's elevated blood pressure readings are secondary to his ongoing infection. -Agree with current therapy per primary team and podiatry -Will increase patient's carvedilol from 3.125 mg twice daily to 6.25 mg twice daily -Also agree to increase amlodipine from 5 mg to 10 mg once daily -Recommended patient keep a  blood pressure diary at home  #3v CAD with chronic stable angina # Hyperlipidemia with statin intolerance Presented with some substernal chest discomfort that was "different" than his prior anginal symptom of chest pressure.  This discomfort he experienced resolved with eating/drinking something.  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  Fortunately, this admission his EKG looks essentially identical to prior from 12/2020, and troponins x 3 are negative at 10. -Continue aspirin 81 mg daily, likely indefinitely -Continue Zetia and Vascepa on an outpatient basis. -His lipids remain uncontrolled, and unfortunately is intolerant to multiple statins.  Encourage compliance with low-fat and low cholesterol diet -Consider addition of long-acting nitrate, but the patient had significant "weakness" with this in the past. -Discussed appropriate sublingual nitroglycerin usage and emergency department precautions for chest [name].  # Chronic HFrEF Last EF from 09/2021 was 40%.  Patient is intolerant to losartan, and ACE inhibitors.  He appears euvolemic on exam without clinical evidence of volume overload.  Consider addition of SGLT2i, MRA on an outpatient basis for GDMT as well as interval echocardiogram.  Cardiology will sign off. Please haiku with questions or re-engage if needed.  Recommend keeping outpatient follow-up as scheduled with [facility] clinic cardiology on 12/22/2022 at 9:30 AM.  This patient's plan of care was discussed and created with Dr. Darrold Junker and he is in agreement.  Signed: Rebeca Allegra , [name] 12/18/2022, 2:01 PM [facility]

## 2022-12-18 NOTE — Inpatient Diabetes Management (Signed)
Inpatient Diabetes Program Recommendations  AACE/ADA: New Consensus Statement on Inpatient Glycemic Control   Target Ranges:  Prepandial:   less than 140 mg/dL      Peak postprandial:   less than 180 mg/dL (1-2 hours)      Critically ill patients:  140 - 180 mg/dL    Latest Reference Range & Units 12/17/22 11:16 12/17/22 16:30 12/17/22 21:21 12/18/22 07:53  Glucose-Capillary 70 - 99 mg/dL 161 (H) 096 (H) 045 (H) 196 (H)    Review of Glycemic Control  Diabetes history: DM2 Outpatient Diabetes medications: Metformin XR 1000 mg daily, Glipizide 10 mg BID Current orders for Inpatient glycemic control: Novolog 0-9 units TID with meals, Novolog 0-5 units at bedtime, Metformin XR 1000 mg daily  Inpatient Diabetes Program Recommendations:    Insulin: If post prandial glucose remains consistently over 180 mg/dl, please consider ordering Novolog 3 units TID with meals for meal coverage if patient eats at least 50% of meals.  Thanks, Orlando Penner, RN, MSN, CDCES Diabetes Coordinator Inpatient Diabetes Program [phone] (Team Pager from 8am to 5pm)

## 2022-12-19 DIAGNOSIS — L03031 Cellulitis of right toe: Secondary | ICD-10-CM | POA: Diagnosis not present

## 2022-12-19 LAB — GLUCOSE, CAPILLARY: Glucose-Capillary: 181 mg/dL — ABNORMAL HIGH (ref 70–99)

## 2022-12-19 LAB — AEROBIC/ANAEROBIC CULTURE W GRAM STAIN (SURGICAL/DEEP WOUND): Special Requests: NORMAL

## 2022-12-19 LAB — CULTURE, BLOOD (ROUTINE X 2)

## 2022-12-19 MED ORDER — CEPHALEXIN 500 MG PO CAPS: 500.0000 mg | ORAL_CAPSULE | Freq: Four times a day (QID) | ORAL | 0 refills | Status: AC

## 2022-12-19 MED ORDER — AMLODIPINE BESYLATE 5 MG PO TABS: 10.0000 mg | ORAL_TABLET | Freq: Every day | ORAL | 1 refills | Status: AC

## 2022-12-19 MED ORDER — CARVEDILOL 3.125 MG PO TABS: 6.2500 mg | ORAL_TABLET | Freq: Two times a day (BID) | ORAL | 0 refills | Status: AC

## 2022-12-19 MED ORDER — CEPHALEXIN 500 MG PO CAPS
500.0000 mg | ORAL_CAPSULE | Freq: Four times a day (QID) | ORAL | Status: DC
  Administered 2022-12-19: 500 mg via ORAL
  Filled 2022-12-19: qty 1

## 2022-12-19 NOTE — Discharge Summary (Signed)
Physician Discharge Summary   Patient: Aaron [name]. MRN: [id number] DOB: 1946-04-19  Admit date:     12/16/2022  Discharge date: 12/19/22  Discharge Physician: Enedina Finner   PCP: Lonie Peak, [name]   Recommendations at discharge:  {Tip this will not be part of the note when signed- Example include specific recommendations for outpatient follow-up, pending tests to follow-up on. (Optional):26781}  F/u Dr Cassie Freer in 1 weeks F/u PCP in 1 week  Discharge Diagnoses: Principal Problem:   Cellulitis of great toe of right foot Active Problems:   CAD (coronary artery disease)   Chest pain   Acute renal failure superimposed on stage 3a chronic kidney disease (HCC)   HTN (hypertension)   Hyperlipidemia, mixed   Chronic systolic CHF (congestive heart failure) (HCC)   Type II diabetes mellitus with renal manifestations (HCC)   Obesity (BMI 30-39.9)   Aaron Saunas Breon Enz. is a 77 y.o. male with medical history significant of HTN, HLD, DM, CAD, s/p of stent, dCHF, gout, CKD-3a, present with right great toe pain and chest pain.   Pt states that he pulled his right big toenail 5 weeks ago himself due to ingrown toenail.  He has been inserting a metal as shaped hook in the wound to keep it open and draining He also reports elevated blood pressure, with SBP up to 190 at home. He reports chest pain.    Cellulitis of great toe of right foot: Patient does not have fever or leukocytosis.  Clinically does not seem to have sepsis.  - Empiric antimicrobial treatment with vancomycin, Flagyl, Rocephin--change to IV vancomycin - PRN Zofran for nausea, morphine and Percocet for pain - Blood cultures x 2 --negative -consulted Dr. Ether Griffins of podiatry--Orders were written for Betadine dressings to be performed. Recommend just broad-spectrum antibiotics upon discharge. Currently gram-positive cocci and gram-negative rods on preliminary culture. I recommend patient follow-up in 2 weeks in the outpatient  clinic with podiatry.    CAD (coronary artery disease) and CP: His chest pain has subsided.  Troponin negative x 3.  Possibly due to demand ischemia.  Patient had elevated blood pressure at home. -As needed nitroglycerin, morphine -Aspirin, Zetia -trend trop x4 negative -- patient requested cardiology consultation with [name]. Adjusted blood pressure meds. Patient is chest pain free. -- He will follow-up cardiology next week.   Acute renal failure superimposed on stage 3a chronic kidney disease (HCC): Baseline creatinine 0.97 on 12/21/2020.  His creatinine is 1.78, BUN 36, GFR 39 today. -IV fluid as above--creat trending down -Avoid using renal toxic medications   HTN (hypertension): IV hydralazine as needed -Amlodipine, Coreg--dose adjusted   Hyperlipidemia, mixed -Zetia -Patient is also on Repatha at home   Chronic systolic CHF (congestive heart failure) (HCC): 2D echo on 10/01/2021 showed EF of 40%.  Patient does not have leg edema or JVD.  CHF seem to be compensated. -Check BNP --> 24.8   Type II diabetes mellitus with renal manifestations Aaron [name]): Recent A1c 8.1, poorly controlled.  Patient is taking glipizide and metformin at home.  Patient refused insulin initially, but agreed to take insulin later on.  He strongly wants to take his metformin -SSI -Metformin   Obesity (BMI 30-39.9): Body weight while 8.9 kg, BMI 32.55 -Encourage losing weight -Exercise and healthy diet         DVT ppx: SCD   Code Status: Full code    Family communication :pt says family is informed Consults :Podiatry CODE STATUS: FULL DVT Prophylaxis :scd   {  Tip this will not be part of the note when signed Body mass index is 32.55 kg/m. , ,  (Optional):26781}  {(NOTE) Pain control PDMP Statment (Optional):26782} Consultants: *** Procedures performed: ***  Disposition: {Plan; Disposition:26390} Diet recommendation:  Discharge Diet Orders (From admission, onward)     Start     Ordered    12/19/22 0000  Diet - low sodium heart healthy        12/19/22 1010           {Diet_Plan:26776} DISCHARGE MEDICATION: Allergies as of 12/19/2022       Reactions   Ace Inhibitors Other (See Comments)   weakness   Isosorbide    weakness   Losartan    weakness   Metoprolol Tartrate Other (See Comments)   weakness   Statins Nausea And Vomiting, Other (See Comments)   Patient states he gets real weak and headache.   Sulfa Antibiotics Other (See Comments)   Unknown childhood reaction         Medication List     TAKE these medications    amLODipine 5 MG tablet Commonly known as: NORVASC Take 2 tablets (10 mg total) by mouth daily. What changed: how much to take   aspirin 81 MG chewable tablet Chew 1 tablet (81 mg total) by mouth daily.   augmented betamethasone dipropionate 0.05 % cream Commonly known as: DIPROLENE-AF Apply topically 2 (two) times daily.   carvedilol 3.125 MG tablet Commonly known as: COREG Take 2 tablets (6.25 mg total) by mouth 2 (two) times daily with a meal. What changed: how much to take   cephALEXin 500 MG capsule Commonly known as: KEFLEX Take 1 capsule (500 mg total) by mouth 4 (four) times daily for 19 doses.   colchicine 0.6 MG tablet Take 0.6 mg by mouth as directed.   cyanocobalamin 1000 MCG tablet Commonly known as: VITAMIN B12 Take 1,000 mcg by mouth daily.   ezetimibe 10 MG tablet Commonly known as: ZETIA Take 1 tablet (10 mg total) by mouth daily.   glipiZIDE 10 MG tablet Commonly known as: GLUCOTROL Take 10 mg by mouth 2 (two) times daily before a meal.   indomethacin 50 MG capsule Commonly known as: INDOCIN Take 50 mg by mouth 3 (three) times daily as needed for moderate pain.   metFORMIN 500 MG 24 hr tablet Commonly known as: GLUCOPHAGE-XR Take 1,000 mg by mouth daily.   nitroGLYCERIN 0.4 MG SL tablet Commonly known as: NITROSTAT Take 0.4 mg by mouth every 5 (five) minutes as needed for chest pain.    Repatha SureClick 140 MG/ML Soaj Generic drug: Evolocumab Inject 140 mg into the skin every 14 (fourteen) days.   traMADol 50 MG tablet Commonly known as: Ultram Take 2 tablets (100 mg total) by mouth in the morning, at noon, and at bedtime.   Vascepa 1 g capsule Generic drug: icosapent Ethyl Take 2 g by mouth 2 (two) times daily.               Discharge Care Instructions  (From admission, onward)           Start     Ordered   12/19/22 0000  Discharge wound care:       Comments: Betadine dressings to be performed daily   12/19/22 1010            Follow-up Information     Kendra Opitz, NP. Go in 3 day(s).   Specialty: Nurse Practitioner Why: follow up as  currently scheduled with Marijo Conception, NP in Dr. Glennis Brink office at [facility]  12/22/22 at 9:30 am Contact information: [address] [place] [address] [phone]         Gwyneth Revels, DPM Follow up in 2 week(s).   Specialty: Podiatry Why: right great toe infection Contact information: [address] [place] [address] [phone]                Discharge Exam: Aaron [name] Weights   12/16/22 2039  Weight: 108.9 kg   ***  Condition at discharge: {DC Condition:26389}  The results of significant diagnostics from this hospitalization (including imaging, microbiology, ancillary and laboratory) are listed below for reference.   Imaging Studies: DG Toe Great Right  Result Date: 12/16/2022 CLINICAL DATA:  Right great toe pain EXAM: RIGHT GREAT TOE COMPARISON:  None Available. FINDINGS: Normal alignment. No acute fracture or dislocation. No osseous erosions or abnormal periosteal reaction. 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 There is diffuse soft tissue swelling of the great toe, more severe surrounding the distal phalanx. Ulcer noted along the dorsomedial aspect  of the great toe. No subcutaneous gas or retained radiopaque foreign body identified. IMPRESSION: 1. Soft tissue swelling and ulceration. No subcutaneous gas or retained radiopaque foreign body. 2. No radiographic evidence of osteomyelitis. Electronically Signed   By: Helyn Numbers M.D.   On: 12/16/2022 21:27   DG Chest 2 View  Result Date: 12/16/2022 CLINICAL DATA:  Chest pain EXAM: CHEST - 2 VIEW COMPARISON:  12/20/2020 FINDINGS: The heart size and mediastinal contours are within normal limits. Both lungs are clear. The visualized skeletal structures are unremarkable. IMPRESSION: No active cardiopulmonary disease. Electronically Signed   By: Helyn Numbers M.D.   On: 12/16/2022 21:25    Microbiology: Results for orders placed or performed during the [name] encounter of 12/16/22  Aerobic/Anaerobic Culture w Gram Stain (surgical/deep wound)     Status: None (Preliminary result)   Collection Time: 12/17/22 12:22 AM   Specimen: Toe; Wound  Result Value Ref Range Status   Specimen Description   Final    TOE RIGHT Performed at [name], [address], [place], [place] [address]    Special Requests   Final    Normal Performed at [facility], [address] [place], [place] [address]    Gram Stain   Final    RARE WBC PRESENT, PREDOMINANTLY PMN FEW GRAM POSITIVE COCCI FEW GRAM NEGATIVE RODS Performed at [name], [address], [place], [place] [address]    Culture ABUNDANT STAPHYLOCOCCUS AUREUS  Final   Report Status PENDING  Incomplete   Organism ID, Bacteria STAPHYLOCOCCUS AUREUS  Final      Susceptibility   Staphylococcus aureus - MIC*    CIPROFLOXACIN <=0.5 SENSITIVE Sensitive     ERYTHROMYCIN RESISTANT Resistant     GENTAMICIN <=0.5 SENSITIVE Sensitive     OXACILLIN <=0.25 SENSITIVE Sensitive     TETRACYCLINE <=1 SENSITIVE Sensitive     VANCOMYCIN <=0.5 SENSITIVE Sensitive     TRIMETH/SULFA <=10 SENSITIVE Sensitive     CLINDAMYCIN  RESISTANT Resistant     RIFAMPIN <=0.5 SENSITIVE Sensitive     Inducible Clindamycin POSITIVE Resistant     LINEZOLID 2 SENSITIVE Sensitive     * ABUNDANT STAPHYLOCOCCUS AUREUS  Culture, blood (Routine X 2) w Reflex to ID Panel     Status: None (Preliminary result)  Collection Time: 12/17/22 11:18 AM   Specimen: BLOOD  Result Value Ref Range Status   Specimen Description BLOOD LEFT ANTECUBITAL  Final   Special Requests   Final    BOTTLES DRAWN AEROBIC AND ANAEROBIC Blood Culture results may not be optimal due to an excessive volume of blood received in culture bottles   Culture   Final    NO GROWTH 2 DAYS Performed at [facility], [address] [place], [place] [address]    Report Status PENDING  Incomplete  Culture, blood (Routine X 2) w Reflex to ID Panel     Status: None (Preliminary result)   Collection Time: 12/17/22 11:18 AM   Specimen: BLOOD  Result Value Ref Range Status   Specimen Description BLOOD RIGHT [name]  Final   Special Requests   Final    BOTTLES DRAWN AEROBIC AND ANAEROBIC Blood Culture adequate volume   Culture   Final    NO GROWTH 2 DAYS Performed at [facility], [address] [place], [place] [address]    Report Status PENDING  Incomplete    Labs: CBC: Recent Labs  Lab 12/16/22 2034 12/18/22 0415  WBC 8.8 8.7  HGB 13.3 12.9*  HCT 39.5 39.2  MCV 90.0 92.0  PLT 207 185   Basic Metabolic Panel: Recent Labs  Lab 12/16/22 2034 12/18/22 0415  NA 135 139  K 4.7 4.5  CL 103 108  CO2 21* 22  GLUCOSE 219* 210*  BUN 36* 29*  CREATININE 1.78* 1.26*  CALCIUM 9.1 8.6*   Liver Function Tests: Recent Labs  Lab 12/16/22 2034  AST 22  ALT 26  ALKPHOS 85  BILITOT 0.7  PROT 7.2  ALBUMIN 4.2   CBG: Recent Labs  Lab 12/18/22 1153 12/18/22 1213 12/18/22 1634 12/18/22 2041 12/19/22 0736  GLUCAP 229* 247* 203* 254* 181*    Discharge time spent: {LESS THAN/GREATER THAN:26388} 30 minutes.  Signed: Enedina Finner,  MD [facility] 12/19/2022

## 2022-12-19 NOTE — Discharge Instructions (Signed)
Continue Betadine dressing daily on right great toe

## 2022-12-19 NOTE — Progress Notes (Signed)
Aaron [name].  A and O x 4. VSS. Pt tolerating diet well. No complaints of [name] or nausea. IV removed intact, prescriptions given. Pt voiced understanding of discharge instructions with no further questions. Pt discharged via wheelchair.     Allergies as of 12/19/2022       Reactions   Ace Inhibitors Other (See Comments)   weakness   Isosorbide    weakness   Losartan    weakness   Metoprolol Tartrate Other (See Comments)   weakness   Statins Nausea And Vomiting, Other (See Comments)   Patient states he gets real weak and headache.   Sulfa Antibiotics Other (See Comments)   Unknown childhood reaction         Medication List     TAKE these medications    amLODipine 5 MG tablet Commonly known as: NORVASC Take 2 tablets (10 mg total) by mouth daily. What changed: how much to take   aspirin 81 MG chewable tablet Chew 1 tablet (81 mg total) by mouth daily.   augmented betamethasone dipropionate 0.05 % cream Commonly known as: DIPROLENE-AF Apply topically 2 (two) times daily.   carvedilol 3.125 MG tablet Commonly known as: COREG Take 2 tablets (6.25 mg total) by mouth 2 (two) times daily with a meal. What changed: how much to take   cephALEXin 500 MG capsule Commonly known as: KEFLEX Take 1 capsule (500 mg total) by mouth 4 (four) times daily for 19 doses.   colchicine 0.6 MG tablet Take 0.6 mg by mouth as directed.   cyanocobalamin 1000 MCG tablet Commonly known as: VITAMIN B12 Take 1,000 mcg by mouth daily.   ezetimibe 10 MG tablet Commonly known as: ZETIA Take 1 tablet (10 mg total) by mouth daily.   glipiZIDE 10 MG tablet Commonly known as: GLUCOTROL Take 10 mg by mouth 2 (two) times daily before a meal.   indomethacin 50 MG capsule Commonly known as: INDOCIN Take 50 mg by mouth 3 (three) times daily as needed for moderate [name].   metFORMIN 500 MG 24 hr tablet Commonly known as: GLUCOPHAGE-XR Take 1,000 mg by mouth daily.   nitroGLYCERIN 0.4  MG SL tablet Commonly known as: NITROSTAT Take 0.4 mg by mouth every 5 (five) minutes as needed for chest [name].   Repatha SureClick 140 MG/ML Soaj Generic drug: Evolocumab Inject 140 mg into the skin every 14 (fourteen) days.   traMADol 50 MG tablet Commonly known as: Ultram Take 2 tablets (100 mg total) by mouth in the morning, at noon, and at bedtime.   Vascepa 1 g capsule Generic drug: icosapent Ethyl Take 2 g by mouth 2 (two) times daily.               Discharge Care Instructions  (From admission, onward)           Start     Ordered   12/19/22 0000  Discharge wound care:       Comments: Betadine dressings to be performed daily   12/19/22 1010            Vitals:   12/19/22 0300 12/19/22 0736  BP: 136/88 (!) 144/76  Pulse: 72 77  Resp: 16 16  Temp: 98.2 F (36.8 C) 97.9 F (36.6 C)  SpO2: 98% 99%    Aaron [name]

## 2022-12-20 LAB — CULTURE, BLOOD (ROUTINE X 2): Culture: NO GROWTH

## 2022-12-20 LAB — AEROBIC/ANAEROBIC CULTURE W GRAM STAIN (SURGICAL/DEEP WOUND)

## 2022-12-22 LAB — CULTURE, BLOOD (ROUTINE X 2)
Culture: NO GROWTH
Special Requests: ADEQUATE

## 2023-02-08 ENCOUNTER — Other Ambulatory Visit
Admission: RE | Admit: 2023-02-08 | Discharge: 2023-02-08 | Disposition: A | Discharge status: Home / Self Care | Payer: Medicare HMO | Source: Ambulatory Visit | Attending: Nurse Practitioner | Admitting: Nurse Practitioner

## 2023-02-08 DIAGNOSIS — Z01818 Encounter for other preprocedural examination: Secondary | ICD-10-CM | POA: Diagnosis present

## 2023-02-08 DIAGNOSIS — I5022 Chronic systolic (congestive) heart failure: Secondary | ICD-10-CM | POA: Insufficient documentation

## 2023-02-08 LAB — BRAIN NATRIURETIC PEPTIDE: B Natriuretic Peptide: 21.6 pg/mL (ref 0.0–100.0)

## 2023-03-04 ENCOUNTER — Other Ambulatory Visit
Admission: RE | Admit: 2023-03-04 | Discharge: 2023-03-04 | Disposition: A | Discharge status: Home / Self Care | Payer: Medicare HMO | Source: Ambulatory Visit | Attending: Nurse Practitioner | Admitting: Nurse Practitioner

## 2023-03-04 DIAGNOSIS — Z01818 Encounter for other preprocedural examination: Secondary | ICD-10-CM | POA: Diagnosis present

## 2023-03-04 LAB — BRAIN NATRIURETIC PEPTIDE: B Natriuretic Peptide: 24.1 pg/mL (ref 0.0–100.0)

## 2023-03-09 ENCOUNTER — Encounter: Payer: Self-pay | Admitting: Cardiology

## 2023-03-09 ENCOUNTER — Ambulatory Visit
Admission: RE | Admit: 2023-03-09 | Discharge: 2023-03-09 | Disposition: A | Discharge status: Home / Self Care | Payer: Medicare HMO | Attending: Cardiology | Admitting: Cardiology

## 2023-03-09 ENCOUNTER — Other Ambulatory Visit: Payer: Self-pay

## 2023-03-09 ENCOUNTER — Encounter
Admission: RE | Disposition: A | Discharge status: Home / Self Care | Payer: Self-pay | Source: Home / Self Care | Attending: Cardiology

## 2023-03-09 DIAGNOSIS — I2510 Atherosclerotic heart disease of native coronary artery without angina pectoris: Secondary | ICD-10-CM | POA: Diagnosis not present

## 2023-03-09 DIAGNOSIS — R079 Chest pain, unspecified: Secondary | ICD-10-CM | POA: Insufficient documentation

## 2023-03-09 DIAGNOSIS — I200 Unstable angina: Secondary | ICD-10-CM

## 2023-03-09 DIAGNOSIS — Z955 Presence of coronary angioplasty implant and graft: Secondary | ICD-10-CM | POA: Insufficient documentation

## 2023-03-09 HISTORY — PX: LEFT HEART CATH AND CORONARY ANGIOGRAPHY: CATH118249

## 2023-03-09 HISTORY — DX: Ingrowing nail: L60.0

## 2023-03-09 HISTORY — DX: Essential (primary) hypertension: I10

## 2023-03-09 HISTORY — DX: Hyperlipidemia, unspecified: E78.5

## 2023-03-09 SURGERY — LEFT HEART CATH AND CORONARY ANGIOGRAPHY
Anesthesia: Moderate Sedation | Laterality: Left

## 2023-03-09 MED ORDER — ACETAMINOPHEN 325 MG PO TABS: 650.0000 mg | ORAL_TABLET | ORAL | Status: DC | PRN

## 2023-03-09 MED ORDER — IOHEXOL 300 MG/ML  SOLN
INTRAMUSCULAR | Status: DC | PRN
  Administered 2023-03-09: 70 mL

## 2023-03-09 MED ORDER — MIDAZOLAM HCL 2 MG/2ML IJ SOLN
INTRAMUSCULAR | Status: AC
  Filled 2023-03-09: qty 2

## 2023-03-09 MED ORDER — VERAPAMIL HCL 2.5 MG/ML IV SOLN
INTRAVENOUS | Status: DC | PRN
  Administered 2023-03-09: 2.5 mg via INTRAVENOUS

## 2023-03-09 MED ORDER — MIDAZOLAM HCL 2 MG/2ML IJ SOLN
INTRAMUSCULAR | Status: DC | PRN
  Administered 2023-03-09: 1 mg via INTRAVENOUS

## 2023-03-09 MED ORDER — SODIUM CHLORIDE 0.9 % WEIGHT BASED INFUSION: 1.0000 mL/kg/h | INTRAVENOUS | Status: DC

## 2023-03-09 MED ORDER — SODIUM CHLORIDE 0.9% FLUSH: 3.0000 mL | INTRAVENOUS | Status: DC | PRN

## 2023-03-09 MED ORDER — HEPARIN SODIUM (PORCINE) 1000 UNIT/ML IJ SOLN
INTRAMUSCULAR | Status: DC | PRN
  Administered 2023-03-09: 5000 [IU] via INTRAVENOUS

## 2023-03-09 MED ORDER — HEPARIN (PORCINE) IN NACL 2000-0.9 UNIT/L-% IV SOLN
INTRAVENOUS | Status: DC | PRN
  Administered 2023-03-09: 1000 mL

## 2023-03-09 MED ORDER — SODIUM CHLORIDE 0.9 % IV SOLN: 250.0000 mL | INTRAVENOUS | Status: DC | PRN

## 2023-03-09 MED ORDER — ONDANSETRON HCL 4 MG/2ML IJ SOLN: 4.0000 mg | Freq: Four times a day (QID) | INTRAMUSCULAR | Status: DC | PRN

## 2023-03-09 MED ORDER — LIDOCAINE HCL 1 % IJ SOLN
INTRAMUSCULAR | Status: AC
  Filled 2023-03-09: qty 20

## 2023-03-09 MED ORDER — SODIUM CHLORIDE 0.9 % WEIGHT BASED INFUSION
3.0000 mL/kg/h | INTRAVENOUS | Status: AC
  Administered 2023-03-09: 3 mL/kg/h via INTRAVENOUS

## 2023-03-09 MED ORDER — LIDOCAINE HCL (PF) 1 % IJ SOLN
INTRAMUSCULAR | Status: DC | PRN
  Administered 2023-03-09: 2 mL

## 2023-03-09 MED ORDER — HEPARIN SODIUM (PORCINE) 1000 UNIT/ML IJ SOLN
INTRAMUSCULAR | Status: AC
  Filled 2023-03-09: qty 10

## 2023-03-09 MED ORDER — FENTANYL CITRATE (PF) 100 MCG/2ML IJ SOLN
INTRAMUSCULAR | Status: AC
  Filled 2023-03-09: qty 2

## 2023-03-09 MED ORDER — HEPARIN (PORCINE) IN NACL 1000-0.9 UT/500ML-% IV SOLN
INTRAVENOUS | Status: AC
  Filled 2023-03-09: qty 1000

## 2023-03-09 MED ORDER — VERAPAMIL HCL 2.5 MG/ML IV SOLN
INTRAVENOUS | Status: AC
  Filled 2023-03-09: qty 2

## 2023-03-09 MED ORDER — ASPIRIN 81 MG PO CHEW
81.0000 mg | CHEWABLE_TABLET | ORAL | Status: AC
  Administered 2023-03-09: 81 mg via ORAL

## 2023-03-09 MED ORDER — SODIUM CHLORIDE 0.9% FLUSH: 3.0000 mL | Freq: Two times a day (BID) | INTRAVENOUS | Status: DC

## 2023-03-09 MED ORDER — FENTANYL CITRATE (PF) 100 MCG/2ML IJ SOLN
INTRAMUSCULAR | Status: DC | PRN
  Administered 2023-03-09: 25 ug via INTRAVENOUS

## 2023-03-09 MED ORDER — HYDRALAZINE HCL 20 MG/ML IJ SOLN: 10.0000 mg | INTRAMUSCULAR | Status: DC | PRN

## 2023-03-09 MED ORDER — ASPIRIN 81 MG PO CHEW
CHEWABLE_TABLET | ORAL | Status: AC
  Filled 2023-03-09: qty 1

## 2023-03-09 SURGICAL SUPPLY — 11 items
CATH 5FR JL3.5 JR4 ANG PIG MP (CATHETERS) IMPLANT
DEVICE RAD TR BAND REGULAR (VASCULAR PRODUCTS) IMPLANT
DRAPE BRACHIAL (DRAPES) IMPLANT
GLIDESHEATH SLEND SS 6F .021 (SHEATH) IMPLANT
GUIDEWIRE INQWIRE 1.5J.035X260 (WIRE) IMPLANT
INQWIRE 1.5J .035X260CM (WIRE) ×1
KIT SYRINGE INJ CVI SPIKEX1 (MISCELLANEOUS) IMPLANT
PACK CARDIAC CATH (CUSTOM PROCEDURE TRAY) ×1 IMPLANT
PROTECTION STATION PRESSURIZED (MISCELLANEOUS) ×1
SET ATX-X65L (MISCELLANEOUS) IMPLANT
STATION PROTECTION PRESSURIZED (MISCELLANEOUS) IMPLANT

## 2023-03-10 ENCOUNTER — Encounter: Payer: Self-pay | Admitting: Cardiology

## 2023-04-06 ENCOUNTER — Ambulatory Visit (INDEPENDENT_AMBULATORY_CARE_PROVIDER_SITE_OTHER): Payer: Medicare HMO | Admitting: Nurse Practitioner

## 2023-04-06 ENCOUNTER — Encounter (INDEPENDENT_AMBULATORY_CARE_PROVIDER_SITE_OTHER): Payer: Self-pay | Admitting: Nurse Practitioner

## 2023-04-06 VITALS — BP 133/72 | HR 74 | Resp 18 | Ht 72.0 in | Wt 240.0 lb

## 2023-04-06 DIAGNOSIS — I6523 Occlusion and stenosis of bilateral carotid arteries: Secondary | ICD-10-CM | POA: Diagnosis not present

## 2023-04-06 DIAGNOSIS — I10 Essential (primary) hypertension: Secondary | ICD-10-CM | POA: Diagnosis not present

## 2023-04-06 DIAGNOSIS — E1129 Type 2 diabetes mellitus with other diabetic kidney complication: Secondary | ICD-10-CM

## 2023-04-12 ENCOUNTER — Ambulatory Visit: Payer: Medicare HMO | Attending: Anesthesiology | Admitting: Anesthesiology

## 2023-04-12 DIAGNOSIS — M79604 Pain in right leg: Secondary | ICD-10-CM | POA: Diagnosis not present

## 2023-04-12 DIAGNOSIS — Z79891 Long term (current) use of opiate analgesic: Secondary | ICD-10-CM

## 2023-04-12 DIAGNOSIS — G8929 Other chronic pain: Secondary | ICD-10-CM

## 2023-04-12 DIAGNOSIS — M5441 Lumbago with sciatica, right side: Secondary | ICD-10-CM | POA: Diagnosis not present

## 2023-04-12 DIAGNOSIS — E1142 Type 2 diabetes mellitus with diabetic polyneuropathy: Secondary | ICD-10-CM

## 2023-04-12 DIAGNOSIS — M961 Postlaminectomy syndrome, not elsewhere classified: Secondary | ICD-10-CM | POA: Diagnosis not present

## 2023-04-12 DIAGNOSIS — G894 Chronic pain syndrome: Secondary | ICD-10-CM | POA: Diagnosis not present

## 2023-04-12 DIAGNOSIS — M5416 Radiculopathy, lumbar region: Secondary | ICD-10-CM

## 2023-04-12 DIAGNOSIS — M25511 Pain in right shoulder: Secondary | ICD-10-CM

## 2023-04-12 DIAGNOSIS — M25512 Pain in left shoulder: Secondary | ICD-10-CM

## 2023-04-12 DIAGNOSIS — M79605 Pain in left leg: Secondary | ICD-10-CM

## 2023-04-12 DIAGNOSIS — M5417 Radiculopathy, lumbosacral region: Secondary | ICD-10-CM

## 2023-04-12 DIAGNOSIS — Z79899 Other long term (current) drug therapy: Secondary | ICD-10-CM

## 2023-04-12 MED ORDER — TRAMADOL HCL 50 MG PO TABS: 100.0000 mg | ORAL_TABLET | Freq: Three times a day (TID) | ORAL | 2 refills | Status: AC

## 2023-04-12 NOTE — Progress Notes (Signed)
Subjective:    Patient ID: Aaron [name]., male    DOB: 17-Apr-1946, 77 y.o.   MRN: [id number] Chief Complaint  Patient presents with   New Patient (Initial Visit)    NP. consult. carotid stenosis. outside report in referral packet. parrish    The patient is seen for evaluation of carotid stenosis. The carotid stenosis was identified several years ago but seem to be lost to follow-up.  He recently had a follow-up ultrasound ordered by his which had 50 to 69% stenosis of the right ICA with 1 to 49% on the left.  He had patent bilateral vertebral arteries bilaterally..  The patient denies amaurosis fugax. There is no recent history of TIA symptoms or focal motor deficits. There is no prior documented CVA.  There is no history of migraine headaches. There is no history of seizures.  The patient is taking enteric-coated aspirin 81 mg daily.  No recent shortening of the patient's walking distance or new symptoms consistent with claudication.  No history of rest [name] symptoms. No new ulcers or wounds of the lower extremities have occurred.  There is no history of DVT, PE or superficial thrombophlebitis. No recent episodes of angina or shortness of breath documented.   The patient did not have studies today however with the criteria and velocities noted, based on Margraf and he would be 40 to 59% on the right and 1 to 39% on the left ICA.     Review of Systems  Eyes:  Negative for visual disturbance.  Neurological:  Negative for weakness.  All other systems reviewed and are negative.      Objective:   Physical Exam Vitals reviewed.  HENT:     Head: Normocephalic.  Neck:     Vascular: No carotid bruit.  Cardiovascular:     Rate and Rhythm: Normal rate.     Pulses: Normal pulses.  Pulmonary:     Effort: Pulmonary effort is normal.  Skin:    General: Skin is warm and dry.  Neurological:     Mental Status: He is alert and oriented to person, place, and time.   Psychiatric:        Mood and Affect: Mood normal.        Behavior: Behavior normal.        Thought Content: Thought content normal.        Judgment: Judgment normal.     BP 133/72 (BP Location: Left Arm)   Pulse 74   Resp 18   Ht 6' (1.829 m)   Wt 240 lb (108.9 kg)   BMI 32.55 kg/m   Past Medical History:  Diagnosis Date   Cellulitis    Diabetes mellitus without complication (HCC)    Hyperlipidemia    Hypertension    Ingrown toenail    MI, old     Social History   Socioeconomic History   Marital status: Widowed    Spouse name: Not on file   Number of children: Not on file   Years of education: Not on file   Highest education level: Not on file  Occupational History   Not on file  Tobacco Use   Smoking status: Former   Smokeless tobacco: Former   Tobacco comments:    7/20 quit 35 years ago  Vaping Use   Vaping status: Never Used  Substance and Sexual Activity   Alcohol use: No   Drug use: No   Sexual activity: Not on file  Other Topics Concern  Not on file  Social History Narrative   Not on file   Social Determinants of Health   Financial Resource Strain: Low Risk  (05/06/2020)   Received from [facility], [facility]   Overall Financial Resource Strain (CARDIA)    Difficulty of Paying Living Expenses: Not hard at all  Food Insecurity: No Food Insecurity (12/17/2022)   Hunger Vital Sign    Worried About Running Out of Food in the Last Year: Never true    Ran Out of Food in the Last Year: Never true  Transportation Needs: No Transportation Needs (12/17/2022)   PRAPARE - Administrator, Civil Service (Medical): No    Lack of Transportation (Non-Medical): No  Physical Activity: Insufficiently Active (05/06/2020)   Received from [facility], [facility]   Exercise Vital Sign    Days of Exercise per Week: 4 days    Minutes of Exercise per Session: 10 min  Stress: No Stress Concern Present (05/06/2020)   Received from [facility], [facility] of Occupational Health - Occupational Stress Questionnaire    Feeling of Stress : Not at all  Social Connections: Moderately Isolated (05/06/2020)   Received from [facility], [facility]   Social Connection and Isolation Panel [NHANES]    Frequency of Communication with Friends and Family: More than three times a week    Frequency of Social Gatherings with Friends and Family: More than three times a week    Attends Religious Services: Never    Database administrator or Organizations: No    Attends Banker Meetings: Never    Marital Status: Married  Catering manager Violence: Not At Risk (12/17/2022)   Humiliation, Afraid, Rape, and Kick questionnaire    Fear of Current or Ex-Partner: No    Emotionally Abused: No    Physically Abused: No    Sexually Abused: No    Past Surgical History:  Procedure Laterality Date   ABDOMINAL SURGERY     APPENDECTOMY     BACK SURGERY     CARDIAC CATHETERIZATION Left 10/21/2015   Procedure: Left Heart Cath and Coronary Angiography;  Surgeon: Lamar Blinks, MD;  Location: [facility];  Service: Cardiovascular;  Laterality: Left;   CARDIAC CATHETERIZATION N/A 10/21/2015   Procedure: Coronary Stent Intervention;  Surgeon: Marcina Millard, MD;  Location: [facility];  Service: Cardiovascular;  Laterality: N/A;   CHOLECYSTECTOMY     CORONARY ANGIOPLASTY WITH STENT PLACEMENT     CORONARY STENT INTERVENTION N/A 06/13/2019   Procedure: CORONARY STENT INTERVENTION;  Surgeon: Marcina Millard, MD;  Location: [facility];  Service: Cardiovascular;  Laterality: N/A;   CORONARY STENT INTERVENTION N/A 12/20/2020   Procedure: CORONARY STENT INTERVENTION;  Surgeon: Iran Ouch, MD;  Location: [facility];  Service: Cardiovascular;  Laterality: N/A;   LEFT HEART CATH AND CORONARY ANGIOGRAPHY Left 10/07/2016   Procedure: Left Heart Cath and  Coronary Angiography;  Surgeon: Lamar Blinks, MD;  Location: [facility];  Service: Cardiovascular;  Laterality: Left;   LEFT HEART CATH AND CORONARY ANGIOGRAPHY N/A 06/13/2019   Procedure: LEFT HEART CATH AND CORONARY ANGIOGRAPHY;  Surgeon: Lamar Blinks, MD;  Location: [facility];  Service: Cardiovascular;  Laterality: N/A;   LEFT HEART CATH AND CORONARY ANGIOGRAPHY N/A 12/20/2020   Procedure: LEFT HEART CATH AND CORONARY ANGIOGRAPHY;  Surgeon: Iran Ouch, MD;  Location: [facility];  Service: Cardiovascular;  Laterality: N/A;   LEFT HEART CATH AND CORONARY ANGIOGRAPHY Left 03/09/2023   Procedure: LEFT HEART CATH AND CORONARY ANGIOGRAPHY;  Surgeon: Marcina Millard, MD;  Location: [facility];  Service: Cardiovascular;  Laterality: Left;   SHOULDER ARTHROSCOPY WITH SUBACROMIAL DECOMPRESSION AND BICEP TENDON REPAIR Right 02/24/2018   Procedure: SHOULDER ARTHROSCOPY WITH SUBACROMIAL DEBRIDEMENT, DECOMPRESSION, ROTATOR CUFF REPAIR AND POSSIBLE BICEP TENODESIS;  Surgeon: Christena Flake, MD;  Location: [facility];  Service: Orthopedics;  Laterality: Right;   SHOULDER ARTHROSCOPY WITH SUBACROMIAL DECOMPRESSION AND BICEP TENDON REPAIR Left 04/25/2019   Procedure: SHOULDER ARTHROSCOPY WITH DEBRIDEMENT, DECOMPRESSION AND BICEP TENDON REPAIR, ROTATOR CUFF REPAIR;  Surgeon: Christena Flake, MD;  Location: [facility];  Service: Orthopedics;  Laterality: Left;   TONSILLECTOMY      Family History  Problem Relation Age of Onset   Angina Mother     Allergies  Allergen Reactions   Ace Inhibitors Other (See Comments)    weakness   Isosorbide     weakness   Losartan     weakness   Metoprolol Tartrate Other (See Comments)    weakness   Statins Nausea And Vomiting and Other (See Comments)    Patient states he gets real weak and headache.   Sulfa Antibiotics Other (See Comments)    Unknown childhood reaction        Latest Ref Rng & Units 12/18/2022     4:15 AM 12/16/2022    8:34 PM 12/21/2020    5:10 AM  CBC  WBC 4.0 - 10.5 K/uL 8.7  8.8  11.0   Hemoglobin 13.0 - 17.0 g/dL 16.1  09.6  04.5   Hematocrit 39.0 - 52.0 % 39.2  39.5  37.8   Platelets 150 - 400 K/uL 185  207  156       CMP     Component Value Date/Time   NA 139 12/18/2022 0415   NA 133 (L) 09/26/2014 0622   K 4.5 12/18/2022 0415   K 3.8 09/26/2014 0622   CL 108 12/18/2022 0415   CL 99 (L) 09/26/2014 0622   CO2 22 12/18/2022 0415   CO2 26 09/26/2014 0622   GLUCOSE 210 (H) 12/18/2022 0415   GLUCOSE 162 (H) 09/26/2014 0622   BUN 29 (H) 12/18/2022 0415   BUN 11 09/26/2014 0622   CREATININE 1.26 (H) 12/18/2022 0415   CREATININE 0.96 09/26/2014 0622   CALCIUM 8.6 (L) 12/18/2022 0415   CALCIUM 8.1 (L) 09/26/2014 0622   PROT 7.2 12/16/2022 2034   ALBUMIN 4.2 12/16/2022 2034   AST 22 12/16/2022 2034   ALT 26 12/16/2022 2034   ALKPHOS 85 12/16/2022 2034   BILITOT 0.7 12/16/2022 2034   GFRNONAA 58 (L) 12/18/2022 0415   GFRNONAA >60 09/26/2014 0622     No results found.     Assessment & Plan:   1. Bilateral carotid artery stenosis I had a discussion with the patient regarding carotid disease and as well as typical timeline for intervention.  Typically when the patient is asymptomatic we do not intervene until they are at least about 75%.  In the interim we continue to follow.  Based on the patient's studies we will follow annually but because this was the first evaluation we typically will follow-up in 6 months if there remains stable we will plan on following him annually.  He will continue with aspirin and Repatha  2. Benign essential HTN Continue antihypertensive medications as  already ordered, these medications have been reviewed and there are no changes at this time.  3. 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  Hgb A1C to be monitored as already arranged by primary service   Current Outpatient Medications on File Prior to Visit  Medication Sig Dispense Refill   amLODipine (NORVASC) 5 MG tablet Take 2 tablets (10 mg total) by mouth daily. (Patient taking differently: Take 5 mg by mouth daily.) 30 tablet 1   aspirin 81 MG chewable tablet Chew 1 tablet (81 mg total) by mouth daily.     augmented betamethasone dipropionate (DIPROLENE-AF) 0.05 % cream Apply topically 2 (two) times daily.     carvedilol (COREG) 3.125 MG tablet Take 2 tablets (6.25 mg total) by mouth 2 (two) times daily with a meal. 60 tablet 0   colchicine 0.6 MG tablet Take 0.6 mg by mouth as directed.     Continuous Glucose Receiver (FREESTYLE LIBRE 3 READER) DEVI 1 (ONE) EACH FOR CONTINUOUS GLUCOSE MONITORING     Continuous Glucose Sensor (FREESTYLE LIBRE 3 SENSOR) MISC 1 (ONE) KIT APPLIED EVERY 14 DAYS FOR GLUCOSE MONITORING     ezetimibe (ZETIA) 10 MG tablet Take 1 tablet (10 mg total) by mouth daily. 30 tablet 0   glipiZIDE (GLUCOTROL) 10 MG tablet Take 10 mg by mouth 2 (two) times daily before a meal.     indomethacin (INDOCIN) 50 MG capsule Take 50 mg by mouth 3 (three) times daily as needed for moderate [name].     ketoconazole (NIZORAL) 2 % shampoo Apply 1 Application topically once.     metFORMIN (GLUCOPHAGE-XR) 500 MG 24 hr tablet Take 1,000 mg by mouth daily.     nitroGLYCERIN (NITROSTAT) 0.4 MG SL tablet Take 0.4 mg by mouth every 5 (five) minutes as needed for chest [name].      REPATHA SURECLICK 140 MG/ML SOAJ Inject 140 mg into the skin every 14 (fourteen) days.     traMADol (ULTRAM) 50 MG tablet Take 100 mg by mouth 3 (three) times daily.     VASCEPA 1 g capsule Take 2 g by mouth 2 (two) times daily.     vitamin B-12 (CYANOCOBALAMIN) 1000 MCG tablet Take 1,000 mcg by mouth daily.     No current facility-administered medications on file prior to visit.    There are no  Patient Instructions on file for this visit. No follow-ups on file.   Georgiana Spinner, NP

## 2023-04-12 NOTE — Progress Notes (Signed)
Virtual Visit via Telephone Note  I connected with Aaron [name]. on 04/12/23 at  2:00 PM EDT by telephone and verified that I am speaking with the correct person using two identifiers.  Location: Patient: Home Provider: Pain control [name]   I discussed the limitations, risks, security and privacy concerns of performing an evaluation and management service by telephone and the availability of in person appointments. I also discussed with the patient that there may be a patient responsible charge related to this service. The patient expressed understanding and agreed to proceed.   History of Present Illness: I spoke to Aaron [name] via telephone as we are unable link for the video portion the conference.  He reports that he is doing reasonably well with his low back [name] and lower extremity [name].  The quality characteristic and distribution has been stable.  No recent changes are noted.  He still takes his tramadol 2 tablets 3 times a day and this gives him about 60 to 70% relief lasting about 4 to 6 hours.  The [name] is primarily associated with prolonged standing in place or work related activity.  He staying active and without the medication he is unable to do his daily chores and activities.  With the medication he is functionally active and happy with his capability.  No change in lower extremity strength or function or bowel or bladder function is noted.  Review of systems: General: No fevers or chills Pulmonary: No shortness of breath or dyspnea Cardiac: No angina or palpitations or lightheadedness GI: No abdominal [name] or constipation Psych: No depression    Observations/Objective:  Current Outpatient Medications:    [START ON 04/19/2023] traMADol (ULTRAM) 50 MG tablet, Take 2 tablets (100 mg total) by mouth 3 (three) times daily., Disp: 180 tablet, Rfl: 2   amLODipine (NORVASC) 5 MG tablet, Take 2 tablets (10 mg total) by mouth daily. (Patient taking differently: Take 5 mg  by mouth daily.), Disp: 30 tablet, Rfl: 1   aspirin 81 MG chewable tablet, Chew 1 tablet (81 mg total) by mouth daily., Disp:  , Rfl:    augmented betamethasone dipropionate (DIPROLENE-AF) 0.05 % cream, Apply topically 2 (two) times daily., Disp: , Rfl:    carvedilol (COREG) 3.125 MG tablet, Take 2 tablets (6.25 mg total) by mouth 2 (two) times daily with a meal., Disp: 60 tablet, Rfl: 0   colchicine 0.6 MG tablet, Take 0.6 mg by mouth as directed., Disp: , Rfl:    Continuous Glucose Receiver (FREESTYLE LIBRE 3 READER) DEVI, 1 (ONE) EACH FOR CONTINUOUS GLUCOSE MONITORING, Disp: , Rfl:    Continuous Glucose Sensor (FREESTYLE LIBRE 3 SENSOR) MISC, 1 (ONE) KIT APPLIED EVERY 14 DAYS FOR GLUCOSE MONITORING, Disp: , Rfl:    ezetimibe (ZETIA) 10 MG tablet, Take 1 tablet (10 mg total) by mouth daily., Disp: 30 tablet, Rfl: 0   glipiZIDE (GLUCOTROL) 10 MG tablet, Take 10 mg by mouth 2 (two) times daily before a meal., Disp: , Rfl:    indomethacin (INDOCIN) 50 MG capsule, Take 50 mg by mouth 3 (three) times daily as needed for moderate [name]., Disp: , Rfl:    ketoconazole (NIZORAL) 2 % shampoo, Apply 1 Application topically once., Disp: , Rfl:    metFORMIN (GLUCOPHAGE-XR) 500 MG 24 hr tablet, Take 1,000 mg by mouth daily., Disp: , Rfl:    nitroGLYCERIN (NITROSTAT) 0.4 MG SL tablet, Take 0.4 mg by mouth every 5 (five) minutes as needed for chest [name]. , Disp: , Rfl:  REPATHA SURECLICK 140 MG/ML SOAJ, Inject 140 mg into the skin every 14 (fourteen) days., Disp: , Rfl:    traMADol (ULTRAM) 50 MG tablet, Take 100 mg by mouth 3 (three) times daily., Disp: , Rfl:    VASCEPA 1 g capsule, Take 2 g by mouth 2 (two) times daily., Disp: , Rfl:    vitamin B-12 (CYANOCOBALAMIN) 1000 MCG tablet, Take 1,000 mcg by mouth daily., Disp: , Rfl:    Past Medical History:  Diagnosis Date   Cellulitis    Diabetes mellitus without complication (HCC)    Hyperlipidemia    Hypertension    Ingrown toenail    MI, old       Assessment and Plan: 1. Chronic [name] syndrome   2. Failed back surgical syndrome (x3)   3. 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 Chronic lower extremity [name] (2ry area of [name]) (Bilateral) (R>L)   5. Chronic shoulder [name] (Bilateral)   6. Pharmacologic therapy   7. Diabetic peripheral neuropathy (HCC)   8. Chronic use of opiate for therapeutic purpose   9. 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  He is due for return to clinic in 2 months.  He is also due for urine screen in the near future.  He continues to respond favorably to tramadol whereas he has failed more conservative therapy.  He has improved functional activity and capability and is very pleased with his current status with no side effects reported.  Continue follow-up with his primary care physicians for routine medical care.  Continue core stretching strengthening exercises as reviewed.  Follow Up Instructions:    I discussed the assessment and treatment plan with the patient. The patient was provided an opportunity to ask questions and all were answered. The patient agreed with the plan and demonstrated an understanding of the instructions.   The patient was advised to call back or seek an in-person evaluation if the symptoms worsen or if the condition fails to improve as anticipated.  I provided 25 minutes of non-face-to-face time during this encounter.   Yevette Edwards, MD

## 2023-05-06 ENCOUNTER — Ambulatory Visit: Payer: Medicare HMO | Admitting: Urology

## 2023-05-06 ENCOUNTER — Encounter: Payer: Self-pay | Admitting: Urology

## 2023-05-06 VITALS — BP 143/74 | HR 96 | Ht 73.0 in | Wt 234.0 lb

## 2023-05-06 DIAGNOSIS — R972 Elevated prostate specific antigen [PSA]: Secondary | ICD-10-CM | POA: Diagnosis not present

## 2023-05-06 NOTE — Progress Notes (Signed)
I,Aaron [name],acting as a scribe for Riki Altes, MD.,have documented all relevant documentation on the behalf of Riki Altes, MD,as directed by  Riki Altes, MD while in the presence of Riki Altes, MD.  05/06/2023 4:11 PM   Aaron [name]. September 01, 1945 [id number]  Referring provider: Lonie Peak, [name] [address] [place],  [place] [address]  Chief Complaint  Patient presents with   Elevated PSA   Establish Care    HPI: Aaron [name]. is a 77 y.o. male referred for evaluation of an elevated PSA.  PSA drawn July/August 2024 was 4.9; PSA repeated 04/19/2023 was 4.5 Patient states he has had his checked for the last several years, however none of his preceding PSA's were sent. He has occasionally urinary urgency but no bothersome LUTS. Denies previous history of urologic problems. He had a previous left knee replacement and low back surgery, but no pacemaker or other metallic implants. His brother was diagnosed with prostate cancer in his 7's and died of prostate cancer around age 43.    PMH: Past Medical History:  Diagnosis Date   Cellulitis    Diabetes mellitus without complication (HCC)    Hyperlipidemia    Hypertension    Ingrown toenail    MI, old     Surgical History: Past Surgical History:  Procedure Laterality Date   ABDOMINAL SURGERY     APPENDECTOMY     BACK SURGERY     CARDIAC CATHETERIZATION Left 10/21/2015   Procedure: Left Heart Cath and Coronary Angiography;  Surgeon: Lamar Blinks, MD;  Location: [facility];  Service: Cardiovascular;  Laterality: Left;   CARDIAC CATHETERIZATION N/A 10/21/2015   Procedure: Coronary Stent Intervention;  Surgeon: Marcina Millard, MD;  Location: [facility];  Service: Cardiovascular;  Laterality: N/A;   CHOLECYSTECTOMY     CORONARY ANGIOPLASTY WITH STENT PLACEMENT     CORONARY STENT INTERVENTION N/A 06/13/2019   Procedure: CORONARY STENT INTERVENTION;  Surgeon:  Marcina Millard, MD;  Location: [facility];  Service: Cardiovascular;  Laterality: N/A;   CORONARY STENT INTERVENTION N/A 12/20/2020   Procedure: CORONARY STENT INTERVENTION;  Surgeon: Iran Ouch, MD;  Location: [facility];  Service: Cardiovascular;  Laterality: N/A;   LEFT HEART CATH AND CORONARY ANGIOGRAPHY Left 10/07/2016   Procedure: Left Heart Cath and Coronary Angiography;  Surgeon: Lamar Blinks, MD;  Location: [facility];  Service: Cardiovascular;  Laterality: Left;   LEFT HEART CATH AND CORONARY ANGIOGRAPHY N/A 06/13/2019   Procedure: LEFT HEART CATH AND CORONARY ANGIOGRAPHY;  Surgeon: Lamar Blinks, MD;  Location: [facility];  Service: Cardiovascular;  Laterality: N/A;   LEFT HEART CATH AND CORONARY ANGIOGRAPHY N/A 12/20/2020   Procedure: LEFT HEART CATH AND CORONARY ANGIOGRAPHY;  Surgeon: Iran Ouch, MD;  Location: [facility];  Service: Cardiovascular;  Laterality: N/A;   LEFT HEART CATH AND CORONARY ANGIOGRAPHY Left 03/09/2023   Procedure: LEFT HEART CATH AND CORONARY ANGIOGRAPHY;  Surgeon: Marcina Millard, MD;  Location: [facility];  Service: Cardiovascular;  Laterality: Left;   SHOULDER ARTHROSCOPY WITH SUBACROMIAL DECOMPRESSION AND BICEP TENDON REPAIR Right 02/24/2018   Procedure: SHOULDER ARTHROSCOPY WITH SUBACROMIAL DEBRIDEMENT, DECOMPRESSION, ROTATOR CUFF REPAIR AND POSSIBLE BICEP TENODESIS;  Surgeon: Christena Flake, MD;  Location: [facility];  Service: Orthopedics;  Laterality: Right;   SHOULDER ARTHROSCOPY WITH SUBACROMIAL DECOMPRESSION AND BICEP TENDON REPAIR Left 04/25/2019   Procedure: SHOULDER  ARTHROSCOPY WITH DEBRIDEMENT, DECOMPRESSION AND BICEP TENDON REPAIR, ROTATOR CUFF REPAIR;  Surgeon: Christena Flake, MD;  Location: [facility];  Service: Orthopedics;  Laterality: Left;   TONSILLECTOMY      Home Medications:  Allergies as of 05/06/2023       Reactions   Ace Inhibitors Other (See  Comments)   weakness   Isosorbide    weakness   Losartan    weakness   Metoprolol Tartrate Other (See Comments)   weakness   Statins Nausea And Vomiting, Other (See Comments)   Patient states he gets real weak and headache.   Sulfa Antibiotics Other (See Comments)   Unknown childhood reaction         Medication List        Accurate as of May 06, 2023  4:11 PM. If you have any questions, ask your nurse or doctor.          amLODipine 5 MG tablet Commonly known as: NORVASC Take 2 tablets (10 mg total) by mouth daily. What changed: how much to take   aspirin 81 MG chewable tablet Chew 1 tablet (81 mg total) by mouth daily.   augmented betamethasone dipropionate 0.05 % cream Commonly known as: DIPROLENE-AF Apply topically 2 (two) times daily.   carvedilol 3.125 MG tablet Commonly known as: COREG Take 2 tablets (6.25 mg total) by mouth 2 (two) times daily with a meal.   colchicine 0.6 MG tablet Take 0.6 mg by mouth as directed.   cyanocobalamin 1000 MCG tablet Commonly known as: VITAMIN B12 Take 1,000 mcg by mouth daily.   ezetimibe 10 MG tablet Commonly known as: ZETIA Take 1 tablet (10 mg total) by mouth daily.   FreeStyle Libre 3 Reader Devi 1 (ONE) EACH FOR CONTINUOUS GLUCOSE MONITORING   FreeStyle Libre 3 Sensor Misc 1 (ONE) KIT APPLIED EVERY 14 DAYS FOR GLUCOSE MONITORING   glipiZIDE 10 MG tablet Commonly known as: GLUCOTROL Take 10 mg by mouth 2 (two) times daily before a meal.   indomethacin 50 MG capsule Commonly known as: INDOCIN Take 50 mg by mouth 3 (three) times daily as needed for moderate [name].   ketoconazole 2 % shampoo Commonly known as: NIZORAL Apply 1 Application topically once.   metFORMIN 500 MG 24 hr tablet Commonly known as: GLUCOPHAGE-XR Take 1,000 mg by mouth daily.   nitroGLYCERIN 0.4 MG SL tablet Commonly known as: NITROSTAT Take 0.4 mg by mouth every 5 (five) minutes as needed for chest [name].   Repatha SureClick  140 MG/ML Soaj Generic drug: Evolocumab Inject 140 mg into the skin every 14 (fourteen) days.   traMADol 50 MG tablet Commonly known as: ULTRAM Take 100 mg by mouth 3 (three) times daily.   traMADol 50 MG tablet Commonly known as: Ultram Take 2 tablets (100 mg total) by mouth 3 (three) times daily.   Vascepa 1 g capsule Generic drug: icosapent Ethyl Take 2 g by mouth 2 (two) times daily.        Allergies:  Allergies  Allergen Reactions   Ace Inhibitors Other (See Comments)    weakness   Isosorbide     weakness   Losartan     weakness   Metoprolol Tartrate Other (See Comments)    weakness   Statins Nausea And Vomiting and Other (See Comments)    Patient states he gets real weak and headache.   Sulfa Antibiotics Other (See Comments)    Unknown childhood reaction     Family History: Family History  Problem Relation Age of Onset   Angina Mother     Social History:  reports that he has quit smoking. He has quit using smokeless tobacco. He reports that he does not drink alcohol and does not use drugs.   Physical Exam: BP (!) 143/74   Pulse 96   Ht 6\' 1"  (1.854 m)   Wt 234 lb (106.1 kg)   BMI 30.87 kg/m   Constitutional:  Alert and oriented, No acute distress. HEENT:  AT Respiratory: Normal respiratory effort, no increased work of breathing. GU: 50 g prostate, smooth without nodules. Psychiatric: Normal mood and affect.   Assessment & Plan:    1. Elevated PSA Will request his previous baseline PSA results for review. We discussed prostate cancer screening is no longer recommended for men after age 46. Based on age-specific guidelines his PSA is within a normal range (6.5), though will need to review his previous PSA results.  Although PSA is a prostate cancer screening test he was informed that cancer is not the most common cause of an elevated PSA. Other potential causes including BPH and inflammation were discussed.  He was informed that the only way to  adequately diagnose prostate cancer would be transrectal ultrasound and biopsy of the prostate. The procedure was discussed including potential risks of bleeding and infection/sepsis. He was also informed that a negative biopsy does not conclusively rule out the possibility that prostate cancer may be present and that continued monitoring is required.  The use of newer adjunctive blood and urine tests were discussed.  The use of multiparametric prostate MRI to evaluate for lesions suspicious for high grade prostate cancer and aid in targeted bx was reviewed.  Continued periodic surveillance was also discussed. He will think over these options and once his previous PSA results are reviewed, he will be contacted regarding further recommendations.   I have reviewed the above documentation for accuracy and completeness, and I agree with the above.   Riki Altes, MD  [facility] [address], [address] [place], [place] [address] [phone]

## 2023-05-07 ENCOUNTER — Encounter: Payer: Self-pay | Admitting: Urology

## 2023-05-24 ENCOUNTER — Telehealth: Payer: Self-pay | Admitting: Urology

## 2023-05-24 NOTE — Telephone Encounter (Signed)
Please let patient know his previous PSA results were reviewed and with his most recent PSA decreasing to 4.4 I would recommend surveillance.  Please schedule him for a follow-up visit April 2025.

## 2023-05-25 NOTE — Telephone Encounter (Signed)
Notified patient as instructed, patient pleased. Discussed follow-up appointments, patient agrees  

## 2023-06-07 ENCOUNTER — Other Ambulatory Visit
Admission: RE | Admit: 2023-06-07 | Discharge: 2023-06-07 | Disposition: A | Discharge status: Home / Self Care | Payer: Medicare HMO | Source: Ambulatory Visit | Attending: Nurse Practitioner | Admitting: Nurse Practitioner

## 2023-06-07 DIAGNOSIS — I10 Essential (primary) hypertension: Secondary | ICD-10-CM | POA: Diagnosis present

## 2023-06-07 DIAGNOSIS — I255 Ischemic cardiomyopathy: Secondary | ICD-10-CM | POA: Diagnosis present

## 2023-06-07 DIAGNOSIS — R110 Nausea: Secondary | ICD-10-CM | POA: Insufficient documentation

## 2023-06-07 DIAGNOSIS — I5020 Unspecified systolic (congestive) heart failure: Secondary | ICD-10-CM | POA: Diagnosis present

## 2023-06-07 DIAGNOSIS — R531 Weakness: Secondary | ICD-10-CM | POA: Diagnosis present

## 2023-06-07 LAB — BRAIN NATRIURETIC PEPTIDE: B Natriuretic Peptide: 24.1 pg/mL (ref 0.0–100.0)

## 2023-07-08 DIAGNOSIS — R002 Palpitations: Secondary | ICD-10-CM | POA: Insufficient documentation

## 2023-08-04 ENCOUNTER — Other Ambulatory Visit (INDEPENDENT_AMBULATORY_CARE_PROVIDER_SITE_OTHER): Payer: Self-pay | Admitting: Nurse Practitioner

## 2023-08-04 DIAGNOSIS — I6523 Occlusion and stenosis of bilateral carotid arteries: Secondary | ICD-10-CM

## 2023-08-06 ENCOUNTER — Encounter (INDEPENDENT_AMBULATORY_CARE_PROVIDER_SITE_OTHER): Payer: Self-pay | Admitting: Nurse Practitioner

## 2023-08-06 ENCOUNTER — Ambulatory Visit (INDEPENDENT_AMBULATORY_CARE_PROVIDER_SITE_OTHER): Payer: Medicare HMO | Admitting: Nurse Practitioner

## 2023-08-06 ENCOUNTER — Ambulatory Visit (INDEPENDENT_AMBULATORY_CARE_PROVIDER_SITE_OTHER): Payer: Medicare HMO

## 2023-08-06 VITALS — BP 113/72 | HR 80 | Resp 18 | Ht 73.0 in | Wt 240.0 lb

## 2023-08-06 DIAGNOSIS — I6523 Occlusion and stenosis of bilateral carotid arteries: Secondary | ICD-10-CM

## 2023-08-06 DIAGNOSIS — I10 Essential (primary) hypertension: Secondary | ICD-10-CM

## 2023-08-06 DIAGNOSIS — E1129 Type 2 diabetes mellitus with other diabetic kidney complication: Secondary | ICD-10-CM | POA: Diagnosis not present

## 2023-08-08 NOTE — Progress Notes (Signed)
Subjective:    Patient ID: Aaron [name]., male    DOB: 1945/09/17, 78 y.o.   MRN: [id number] Chief Complaint  Patient presents with   Follow-up    4 month carotid    The patient is seen for follow up evaluation of carotid stenosis. The carotid stenosis followed by ultrasound.   The patient denies amaurosis fugax. There is no recent history of TIA symptoms or focal motor deficits. There is no prior documented CVA.  The patient is taking enteric-coated aspirin 81 mg daily.  There is no history of migraine headaches. There is no history of seizures.  No recent shortening of the patient's walking distance or new symptoms consistent with claudication.  No history of rest [name] symptoms. No new ulcers or wounds of the lower extremities have occurred.  There is no history of DVT, PE or superficial thrombophlebitis. No documented recent episodes of angina or shortness of breath documented.   Carotid Duplex done today shows 1-39% bilaterally.  This is different from the previous studies which showed a greater than 50% stenosis of the right ICA.  These were also done at an outside facility.    Review of Systems  All other systems reviewed and are negative.      Objective:   Physical Exam Vitals reviewed.  HENT:     Head: Normocephalic.  Neck:     Vascular: No carotid bruit.  Cardiovascular:     Rate and Rhythm: Normal rate.     Pulses: Normal pulses.  Pulmonary:     Effort: Pulmonary effort is normal.  Skin:    General: Skin is warm and dry.  Neurological:     Mental Status: He is alert and oriented to person, place, and time.  Psychiatric:        Mood and Affect: Mood normal.        Behavior: Behavior normal.        Thought Content: Thought content normal.        Judgment: Judgment normal.     BP 113/72   Pulse 80   Resp 18   Ht 6\' 1"  (1.854 m)   Wt 240 lb (108.9 kg)   BMI 31.66 kg/m   Past Medical History:  Diagnosis Date   Cellulitis    Diabetes  mellitus without complication (HCC)    Hyperlipidemia    Hypertension    Ingrown toenail    MI, old     Social History   Socioeconomic History   Marital status: Widowed    Spouse name: Not on file   Number of children: Not on file   Years of education: Not on file   Highest education level: Not on file  Occupational History   Not on file  Tobacco Use   Smoking status: Former   Smokeless tobacco: Former   Tobacco comments:    7/20 quit 35 years ago  Vaping Use   Vaping status: Never Used  Substance and Sexual Activity   Alcohol use: No   Drug use: No   Sexual activity: Not on file  Other Topics Concern   Not on file  Social History Narrative   Not on file   Social Drivers of Health   Financial Resource Strain: Low Risk  (05/06/2020)   Received from [facility], [facility]   Overall Financial Resource Strain (CARDIA)    Difficulty of Paying Living Expenses: Not hard at all  Food Insecurity: No Food Insecurity (12/17/2022)  Hunger Vital Sign    Worried About Running Out of Food in the Last Year: Never true    Ran Out of Food in the Last Year: Never true  Transportation Needs: No Transportation Needs (12/17/2022)   PRAPARE - Administrator, Civil Service (Medical): No    Lack of Transportation (Non-Medical): No  Physical Activity: Insufficiently Active (05/06/2020)   Received from [facility], [facility]   Exercise Vital Sign    Days of Exercise per Week: 4 days    Minutes of Exercise per Session: 10 min  Stress: No Stress Concern Present (05/06/2020)   Received from [facility], [facility] - Occupational Stress Questionnaire    Feeling of Stress : Not at all  Social Connections: Moderately Isolated (05/06/2020)   Received from [facility], [facility]   Social Connection and Isolation Panel [NHANES]    Frequency of Communication with Friends and Family: More than three times  a week    Frequency of Social Gatherings with Friends and Family: More than three times a week    Attends Religious Services: Never    Database administrator or Organizations: No    Attends Banker Meetings: Never    Marital Status: Married  Catering manager Violence: Not At Risk (12/17/2022)   Humiliation, Afraid, Rape, and Kick questionnaire    Fear of Current or Ex-Partner: No    Emotionally Abused: No    Physically Abused: No    Sexually Abused: No    Past Surgical History:  Procedure Laterality Date   ABDOMINAL SURGERY     APPENDECTOMY     BACK SURGERY     CARDIAC CATHETERIZATION Left 10/21/2015   Procedure: Left Heart Cath and Coronary Angiography;  Surgeon: Lamar Blinks, MD;  Location: [facility];  Service: Cardiovascular;  Laterality: Left;   CARDIAC CATHETERIZATION N/A 10/21/2015   Procedure: Coronary Stent Intervention;  Surgeon: Marcina Millard, MD;  Location: [facility];  Service: Cardiovascular;  Laterality: N/A;   CHOLECYSTECTOMY     CORONARY ANGIOPLASTY WITH STENT PLACEMENT     CORONARY STENT INTERVENTION N/A 06/13/2019   Procedure: CORONARY STENT INTERVENTION;  Surgeon: Marcina Millard, MD;  Location: [facility];  Service: Cardiovascular;  Laterality: N/A;   CORONARY STENT INTERVENTION N/A 12/20/2020   Procedure: CORONARY STENT INTERVENTION;  Surgeon: Iran Ouch, MD;  Location: [facility];  Service: Cardiovascular;  Laterality: N/A;   LEFT HEART CATH AND CORONARY ANGIOGRAPHY Left 10/07/2016   Procedure: Left Heart Cath and Coronary Angiography;  Surgeon: Lamar Blinks, MD;  Location: [facility];  Service: Cardiovascular;  Laterality: Left;   LEFT HEART CATH AND CORONARY ANGIOGRAPHY N/A 06/13/2019   Procedure: LEFT HEART CATH AND CORONARY ANGIOGRAPHY;  Surgeon: Lamar Blinks, MD;  Location: [facility];  Service: Cardiovascular;  Laterality: N/A;   LEFT HEART CATH AND  CORONARY ANGIOGRAPHY N/A 12/20/2020   Procedure: LEFT HEART CATH AND CORONARY ANGIOGRAPHY;  Surgeon: Iran Ouch, MD;  Location: [facility];  Service: Cardiovascular;  Laterality: N/A;   LEFT HEART CATH AND CORONARY ANGIOGRAPHY Left 03/09/2023   Procedure: LEFT HEART CATH AND CORONARY ANGIOGRAPHY;  Surgeon: Marcina Millard, MD;  Location: [facility];  Service: Cardiovascular;  Laterality: Left;   SHOULDER ARTHROSCOPY WITH SUBACROMIAL DECOMPRESSION AND BICEP TENDON REPAIR Right 02/24/2018   Procedure: SHOULDER  ARTHROSCOPY WITH SUBACROMIAL DEBRIDEMENT, DECOMPRESSION, ROTATOR CUFF REPAIR AND POSSIBLE BICEP TENODESIS;  Surgeon: Christena Flake, MD;  Location: [facility];  Service: Orthopedics;  Laterality: Right;   SHOULDER ARTHROSCOPY WITH SUBACROMIAL DECOMPRESSION AND BICEP TENDON REPAIR Left 04/25/2019   Procedure: SHOULDER ARTHROSCOPY WITH DEBRIDEMENT, DECOMPRESSION AND BICEP TENDON REPAIR, ROTATOR CUFF REPAIR;  Surgeon: Christena Flake, MD;  Location: [facility];  Service: Orthopedics;  Laterality: Left;   TONSILLECTOMY      Family History  Problem Relation Age of Onset   Angina Mother     Allergies  Allergen Reactions   Ace Inhibitors Other (See Comments)    weakness   Isosorbide     weakness   Losartan     weakness   Metoprolol Tartrate Other (See Comments)    weakness   Statins Nausea And Vomiting and Other (See Comments)    Patient states he gets real weak and headache.   Sulfa Antibiotics Other (See Comments)    Unknown childhood reaction        Latest Ref Rng & Units 12/18/2022    4:15 AM 12/16/2022    8:34 PM 12/21/2020    5:10 AM  CBC  WBC 4.0 - 10.5 K/uL 8.7  8.8  11.0   Hemoglobin 13.0 - 17.0 g/dL 09.8  11.9  14.7   Hematocrit 39.0 - 52.0 % 39.2  39.5  37.8   Platelets 150 - 400 K/uL 185  207  156       CMP     Component Value Date/Time   NA 139 12/18/2022 0415   NA 133 (L) 09/26/2014 0622   K 4.5 12/18/2022 0415   K 3.8 09/26/2014 0622    CL 108 12/18/2022 0415   CL 99 (L) 09/26/2014 0622   CO2 22 12/18/2022 0415   CO2 26 09/26/2014 0622   GLUCOSE 210 (H) 12/18/2022 0415   GLUCOSE 162 (H) 09/26/2014 0622   BUN 29 (H) 12/18/2022 0415   BUN 11 09/26/2014 0622   CREATININE 1.26 (H) 12/18/2022 0415   CREATININE 0.96 09/26/2014 0622   CALCIUM 8.6 (L) 12/18/2022 0415   CALCIUM 8.1 (L) 09/26/2014 0622   PROT 7.2 12/16/2022 2034   ALBUMIN 4.2 12/16/2022 2034   AST 22 12/16/2022 2034   ALT 26 12/16/2022 2034   ALKPHOS 85 12/16/2022 2034   BILITOT 0.7 12/16/2022 2034   GFRNONAA 58 (L) 12/18/2022 0415   GFRNONAA >60 09/26/2014 0622     No results found.     Assessment & Plan:   1. Bilateral carotid artery stenosis (Primary) Today the patient's study showed 1 to 39% stenosis of the bilateral internal carotid arteries.  This is notable difference from his previous studies which showed that the right was greater than 50%.  Will have the patient return in 6 months again and if it remains stable we will plan moving to 1 year follow-up.  He will continue with aspirin and Repatha.  2. Benign essential HTN Continue antihypertensive medications as already ordered, these medications have been reviewed and there are no changes at this time.  3. 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  Hgb A1C to be monitored as already arranged by primary service   Current Outpatient Medications on File Prior to Visit  Medication Sig Dispense Refill   amLODipine (NORVASC) 5 MG tablet Take  2 tablets (10 mg total) by mouth daily. (Patient taking differently: Take 5 mg by mouth daily.) 30 tablet 1   aspirin 81 MG chewable tablet Chew 1 tablet (81 mg total) by mouth daily.     augmented betamethasone dipropionate (DIPROLENE-AF) 0.05 % cream Apply  topically 2 (two) times daily.     carvedilol (COREG) 3.125 MG tablet Take 2 tablets (6.25 mg total) by mouth 2 (two) times daily with a meal. 60 tablet 0   colchicine 0.6 MG tablet Take 0.6 mg by mouth as directed.     Continuous Glucose Receiver (FREESTYLE LIBRE 3 READER) DEVI 1 (ONE) EACH FOR CONTINUOUS GLUCOSE MONITORING     Continuous Glucose Sensor (FREESTYLE LIBRE 3 SENSOR) MISC 1 (ONE) KIT APPLIED EVERY 14 DAYS FOR GLUCOSE MONITORING     ezetimibe (ZETIA) 10 MG tablet Take 1 tablet (10 mg total) by mouth daily. 30 tablet 0   glipiZIDE (GLUCOTROL) 10 MG tablet Take 10 mg by mouth 2 (two) times daily before a meal.     indomethacin (INDOCIN) 50 MG capsule Take 50 mg by mouth 3 (three) times daily as needed for moderate [name].     ketoconazole (NIZORAL) 2 % shampoo Apply 1 Application topically once.     metFORMIN (GLUCOPHAGE-XR) 500 MG 24 hr tablet Take 1,000 mg by mouth daily.     nitroGLYCERIN (NITROSTAT) 0.4 MG SL tablet Take 0.4 mg by mouth every 5 (five) minutes as needed for chest [name].      REPATHA SURECLICK 140 MG/ML SOAJ Inject 140 mg into the skin every 14 (fourteen) days.     traMADol (ULTRAM) 50 MG tablet Take 100 mg by mouth 3 (three) times daily.     VASCEPA 1 g capsule Take 2 g by mouth 2 (two) times daily.     vitamin B-12 (CYANOCOBALAMIN) 1000 MCG tablet Take 1,000 mcg by mouth daily.     No current facility-administered medications on file prior to visit.    There are no Patient Instructions on file for this visit. No follow-ups on file.   Georgiana Spinner, NP

## 2023-08-27 ENCOUNTER — Other Ambulatory Visit: Payer: Self-pay

## 2023-08-27 ENCOUNTER — Encounter
Admission: RE | Admit: 2023-08-27 | Discharge: 2023-08-27 | Disposition: A | Discharge status: Home / Self Care | Source: Ambulatory Visit | Attending: Orthopedic Surgery | Admitting: Orthopedic Surgery

## 2023-08-27 VITALS — BP 135/69 | HR 68 | Resp 14 | Ht 73.0 in | Wt 240.3 lb

## 2023-08-27 DIAGNOSIS — R195 Other fecal abnormalities: Secondary | ICD-10-CM | POA: Diagnosis not present

## 2023-08-27 DIAGNOSIS — M1711 Unilateral primary osteoarthritis, right knee: Secondary | ICD-10-CM | POA: Diagnosis not present

## 2023-08-27 DIAGNOSIS — E1129 Type 2 diabetes mellitus with other diabetic kidney complication: Secondary | ICD-10-CM | POA: Insufficient documentation

## 2023-08-27 DIAGNOSIS — Z01812 Encounter for preprocedural laboratory examination: Secondary | ICD-10-CM | POA: Insufficient documentation

## 2023-08-27 DIAGNOSIS — I255 Ischemic cardiomyopathy: Secondary | ICD-10-CM | POA: Diagnosis not present

## 2023-08-27 DIAGNOSIS — Z01818 Encounter for other preprocedural examination: Secondary | ICD-10-CM

## 2023-08-27 HISTORY — DX: Gout, unspecified: M10.9

## 2023-08-27 HISTORY — DX: Type 2 diabetes mellitus without complications: E11.9

## 2023-08-27 HISTORY — DX: Chronic obstructive pulmonary disease, unspecified: J44.9

## 2023-08-27 HISTORY — DX: Personal history of nicotine dependence: Z87.891

## 2023-08-27 HISTORY — DX: Other complications of anesthesia, initial encounter: T88.59XA

## 2023-08-27 HISTORY — DX: Chronic kidney disease, stage 3 unspecified: N18.30

## 2023-08-27 HISTORY — DX: Other intervertebral disc degeneration, lumbar region without mention of lumbar back pain or lower extremity pain: M51.369

## 2023-08-27 HISTORY — DX: Atherosclerotic heart disease of native coronary artery without angina pectoris: I25.10

## 2023-08-27 HISTORY — DX: Osteoarthritis of knee, unspecified: M17.9

## 2023-08-27 HISTORY — DX: Other shoulder lesions, unspecified shoulder: M75.80

## 2023-08-27 HISTORY — DX: Heart failure, unspecified: I50.9

## 2023-08-27 HISTORY — DX: Nausea with vomiting, unspecified: R11.2

## 2023-08-27 HISTORY — DX: Ventricular premature depolarization: I49.3

## 2023-08-27 HISTORY — DX: Gastro-esophageal reflux disease without esophagitis: K21.9

## 2023-08-27 HISTORY — DX: Non-ST elevation (NSTEMI) myocardial infarction: I21.4

## 2023-08-27 HISTORY — DX: Occlusion and stenosis of bilateral carotid arteries: I65.23

## 2023-08-27 HISTORY — DX: Atrial premature depolarization: I49.1

## 2023-08-27 HISTORY — DX: Postlaminectomy syndrome, not elsewhere classified: M96.1

## 2023-08-27 HISTORY — DX: Other specified postprocedural states: Z98.890

## 2023-08-27 HISTORY — DX: Perforation of intestine (nontraumatic): K63.1

## 2023-08-27 HISTORY — DX: Type 2 diabetes mellitus with diabetic polyneuropathy: E11.42

## 2023-08-27 HISTORY — DX: Sleep apnea, unspecified: G47.30

## 2023-08-27 HISTORY — DX: Ischemic cardiomyopathy: I25.5

## 2023-08-27 HISTORY — DX: Chronic pain syndrome: G89.4

## 2023-08-27 HISTORY — DX: Family history of other specified conditions: Z84.89

## 2023-08-27 HISTORY — DX: Personal history of other diseases of the circulatory system: Z86.79

## 2023-08-27 HISTORY — DX: Duodenal ulcer, unspecified as acute or chronic, without hemorrhage or perforation: K26.9

## 2023-08-27 LAB — CBC
HCT: 41.4 % (ref 39.0–52.0)
Hemoglobin: 14.2 g/dL (ref 13.0–17.0)
MCH: 30.5 pg (ref 26.0–34.0)
MCHC: 34.3 g/dL (ref 30.0–36.0)
MCV: 88.8 fL (ref 80.0–100.0)
Platelets: 206 10*3/uL (ref 150–400)
RBC: 4.66 MIL/uL (ref 4.22–5.81)
RDW: 14 % (ref 11.5–15.5)
WBC: 6.1 10*3/uL (ref 4.0–10.5)
nRBC: 0 % (ref 0.0–0.2)

## 2023-08-27 LAB — HEMOGLOBIN A1C
Hgb A1c MFr Bld: 6.6 % — ABNORMAL HIGH (ref 4.8–5.6)
Mean Plasma Glucose: 142.72 mg/dL

## 2023-08-27 LAB — COMPREHENSIVE METABOLIC PANEL
ALT: 26 U/L (ref 0–44)
AST: 22 U/L (ref 15–41)
Albumin: 4.5 g/dL (ref 3.5–5.0)
Alkaline Phosphatase: 50 U/L (ref 38–126)
Anion gap: 10 (ref 5–15)
BUN: 27 mg/dL — ABNORMAL HIGH (ref 8–23)
CO2: 24 mmol/L (ref 22–32)
Calcium: 10 mg/dL (ref 8.9–10.3)
Chloride: 104 mmol/L (ref 98–111)
Creatinine, Ser: 1.22 mg/dL (ref 0.61–1.24)
GFR, Estimated: >60 mL/min (ref >60)
Glucose, Bld: 158 mg/dL — ABNORMAL HIGH (ref 70–99)
Potassium: 4.4 mmol/L (ref 3.5–5.1)
Sodium: 138 mmol/L (ref 135–145)
Total Bilirubin: 0.9 mg/dL (ref 0.0–1.2)
Total Protein: 7.7 g/dL (ref 6.5–8.1)

## 2023-08-27 LAB — URINALYSIS, ROUTINE W REFLEX MICROSCOPIC
Bacteria, UA: NONE SEEN
Bilirubin Urine: NEGATIVE
Glucose, UA: 50 mg/dL — AB
Ketones, ur: NEGATIVE mg/dL
Leukocytes,Ua: NEGATIVE
Nitrite: NEGATIVE
Protein, ur: 30 mg/dL — AB
Specific Gravity, Urine: 1.013 (ref 1.005–1.030)
Squamous Epithelial / HPF: 0 /HPF (ref 0–5)
pH: 5 (ref 5.0–8.0)

## 2023-08-27 LAB — SEDIMENTATION RATE: Sed Rate: 8 mm/h (ref 0–20)

## 2023-08-27 LAB — C-REACTIVE PROTEIN: CRP: 0.6 mg/dL (ref <1.0)

## 2023-08-27 LAB — SURGICAL PCR SCREEN
MRSA, PCR: NEGATIVE
Staphylococcus aureus: NEGATIVE

## 2023-08-27 NOTE — Patient Instructions (Addendum)
 Your procedure is scheduled on:09-03-23 Friday Report to the Registration Desk on the 1st floor of the [facility].Then proceed to the 2nd floor Surgery Desk To find out your arrival time, please call [phone] between 1PM - 3PM on:09-02-23 Thursday If your arrival time is 6:00 am, do not arrive before that time as the [facility] entrance doors do not open until 6:00 am.  REMEMBER: Instructions that are not followed completely may result in serious medical risk, up to and including death; or upon the discretion of your surgeon and anesthesiologist your surgery may need to be rescheduled.  Do not eat food after midnight the night before surgery.  No gum chewing or hard candies.  You may however, drink Water up to 2 hours before you are scheduled to arrive for your surgery. Do not drink anything within 2 hours of your scheduled arrival time.  In addition, your doctor has ordered for you to drink the provided:  Gatorade G2 Drinking this carbohydrate drink up to two hours before surgery helps to reduce insulin resistance and improve patient outcomes. Please complete drinking 2 hours before scheduled arrival time.  One week prior to surgery:Stop NOW (08-27-23) Stop Anti-inflammatories (NSAIDS) such as Advil, Aleve, Ibuprofen, Motrin, Naproxen, Naprosyn and Aspirin based products such as Excedrin, Goody's Powder, BC Powder. Stop ANY OVER THE COUNTER supplements until after surgery (Co Q-10, Omega 3 Fish Oil, Manganese, Vitamin B12, Flaxseed Oil, Zinc, Vitamin K2, [employer] with Lutein)  You may however, continue to take Tylenol if needed for pain up until the day of surgery.  Stop metFORMIN (GLUCOPHAGE-XR) 2 days prior to surgery-Last dose will be on 08-31-23 Tuesday  Continue taking all of your other prescription medications up until the day of surgery.  ON THE DAY OF SURGERY ONLY TAKE THESE MEDICATIONS WITH SIPS OF WATER: -amLODipine (NORVASC)  -carvedilol (COREG)   -ezetimibe (ZETIA)   Continue your 81 mg Aspirin up until the day prior to surgery-Do NOT take the morning of surgery  No Alcohol for 24 hours before or after surgery.  No Smoking including e-cigarettes for 24 hours before surgery.  No chewable tobacco products for at least 6 hours before surgery.  No nicotine patches on the day of surgery.  Do not use any "recreational" drugs for at least a week (preferably 2 weeks) before your surgery.  Please be advised that the combination of cocaine and anesthesia may have negative outcomes, up to and including death. If you test positive for cocaine, your surgery will be cancelled.  On the morning of surgery brush your teeth with toothpaste and water, you may rinse your mouth with mouthwash if you wish. Do not swallow any toothpaste or mouthwash.  Use CHG Soap as directed on instruction sheet.  Do not wear jewelry, make-up, hairpins, clips or nail polish.  For welded (permanent) jewelry: bracelets, anklets, waist bands, etc.  Please have this removed prior to surgery.  If it is not removed, there is a chance that hospital personnel will need to cut it off on the day of surgery.  Do not wear lotions, powders, or perfumes.   Do not shave body hair from the neck down 48 hours before surgery.  Contact lenses, hearing aids and dentures may not be worn into surgery.  Do not bring valuables to the hospital. [facility] is not responsible for any missing/lost belongings or valuables.   Notify your doctor if there is any change in your medical condition (cold, fever, infection).  Wear comfortable clothing (specific to your surgery type) to the hospital.  After surgery, you can help prevent lung complications by doing breathing exercises.  Take deep breaths and cough every 1-2 hours. Your doctor may order a device called an Incentive Spirometer to help you take deep breaths. When coughing or sneezing, hold a pillow firmly against your incision with  both hands. This is called "splinting." Doing this helps protect your incision. It also decreases belly discomfort.  If you are being admitted to the hospital overnight, leave your suitcase in the car. After surgery it may be brought to your room.  In case of increased patient census, it may be necessary for you, the patient, to continue your postoperative care in the Same Day Surgery department.  If you are being discharged the day of surgery, you will not be allowed to drive home. You will need a responsible individual to drive you home and stay with you for 24 hours after surgery.   If you are taking public transportation, you will need to have a responsible individual with you.  Please call the Pre-admissions Testing Dept. at [phone] if you have any questions about these instructions.  Surgery Visitation Policy:  Patients having surgery or a procedure may have two visitors.  Children under the age of 57 must have an adult with them who is not the patient.  Temporary Visitor Restrictions Due to increasing cases of flu, RSV and COVID-19: Children ages 93 and under will not be able to visit patients in [facility] hospitals under most circumstances.  Inpatient Visitation:    Visiting hours are 7 a.m. to 8 p.m. Up to four visitors are allowed at one time in a patient room. The visitors may rotate out with other people during the day.  One visitor age 68 or older may stay with the patient overnight and must be in the room by 8 p.m.    Pre-operative 5 CHG Bath Instructions   You can play a key role in reducing the risk of infection after surgery. Your skin needs to be as free of germs as possible. You can reduce the number of germs on your skin by washing with CHG (chlorhexidine gluconate) soap before surgery. CHG is an antiseptic soap that kills germs and continues to kill germs even after washing.   DO NOT use if you have an allergy to chlorhexidine/CHG or antibacterial  soaps. If your skin becomes reddened or irritated, stop using the CHG and notify one of our RNs at [phone].   Please shower with the CHG soap starting 4 days before surgery using the following schedule:     Please keep in mind the following:  DO NOT shave, including legs and underarms, starting the day of your first shower.   You may shave your face at any point before/day of surgery.  Place clean sheets on your bed the day you start using CHG soap. Use a clean washcloth (not used since being washed) for each shower. DO NOT sleep with pets once you start using the CHG.   CHG Shower Instructions:  If you choose to wash your hair and private area, wash first with your normal shampoo/soap.  After you use shampoo/soap, rinse your hair and body thoroughly to remove shampoo/soap residue.  Turn the water OFF and apply about 3 tablespoons (45 ml) of CHG soap to a CLEAN washcloth.  Apply CHG soap ONLY FROM YOUR NECK DOWN TO YOUR TOES (washing for 3-5 minutes)  DO NOT  use CHG soap on face, private areas, open wounds, or sores.  Pay special attention to the area where your surgery is being performed.  If you are having back surgery, having someone wash your back for you may be helpful. Wait 2 minutes after CHG soap is applied, then you may rinse off the CHG soap.  Pat dry with a clean towel  Put on clean clothes/pajamas   If you choose to wear lotion, please use ONLY the CHG-compatible lotions on the back of this paper.     Additional instructions for the day of surgery: DO NOT APPLY any lotions, deodorants, cologne, or perfumes.   Put on clean/comfortable clothes.  Brush your teeth.  Ask your nurse before applying any prescription medications to the skin.      CHG Compatible Lotions   Aveeno Moisturizing lotion  Cetaphil Moisturizing Cream  Cetaphil Moisturizing Lotion  Clairol Herbal Essence Moisturizing Lotion, Dry Skin  Clairol Herbal Essence Moisturizing Lotion, Extra Dry  Skin  Clairol Herbal Essence Moisturizing Lotion, Normal Skin  Curel Age Defying Therapeutic Moisturizing Lotion with Alpha Hydroxy  Curel Extreme Care Body Lotion  Curel Soothing Hands Moisturizing Hand Lotion  Curel Therapeutic Moisturizing Cream, Fragrance-Free  Curel Therapeutic Moisturizing Lotion, Fragrance-Free  Curel Therapeutic Moisturizing Lotion, Original Formula  Eucerin Daily Replenishing Lotion  Eucerin Dry Skin Therapy Plus Alpha Hydroxy Crme  Eucerin Dry Skin Therapy Plus Alpha Hydroxy Lotion  Eucerin Original Crme  Eucerin Original Lotion  Eucerin Plus Crme Eucerin Plus Lotion  Eucerin TriLipid Replenishing Lotion  Keri Anti-Bacterial Hand Lotion  Keri Deep Conditioning Original Lotion Dry Skin Formula Softly Scented  Keri Deep Conditioning Original Lotion, Fragrance Free Sensitive Skin Formula  Keri Lotion Fast Absorbing Fragrance Free Sensitive Skin Formula  Keri Lotion Fast Absorbing Softly Scented Dry Skin Formula  Keri Original Lotion  Keri Skin Renewal Lotion Keri Silky Smooth Lotion  Keri Silky Smooth Sensitive Skin Lotion  Nivea Body Creamy Conditioning Oil  Nivea Body Extra Enriched Lotion  Nivea Body Original Lotion  Nivea Body Sheer Moisturizing Lotion Nivea Crme  Nivea Skin Firming Lotion  NutraDerm 30 Skin Lotion  NutraDerm Skin Lotion  NutraDerm Therapeutic Skin Cream  NutraDerm Therapeutic Skin Lotion  ProShield Protective Hand Cream  Provon moisturizing lotion  How to Use an Incentive Spirometer An incentive spirometer is a tool that measures how well you are filling your lungs with each breath. Learning to take long, deep breaths using this tool can help you keep your lungs clear and active. This may help to reverse or lessen your chance of developing breathing (pulmonary) problems, especially infection. You may be asked to use a spirometer: After a surgery. If you have a lung problem or a history of smoking. After a long period of time  when you have been unable to move or be active. If the spirometer includes an indicator to show the highest number that you have reached, your health care provider or respiratory therapist will help you set a goal. Keep a log of your progress as told by your health care provider. What are the risks? Breathing too quickly may cause dizziness or cause you to pass out. Take your time so you do not get dizzy or light-headed. If you are in pain, you may need to take pain medicine before doing incentive spirometry. It is harder to take a deep breath if you are having pain. How to use your incentive spirometer  Sit up on the edge of your bed or on  a chair. Hold the incentive spirometer so that it is in an upright position. Before you use the spirometer, breathe out normally. Place the mouthpiece in your mouth. Make sure your lips are closed tightly around it. Breathe in slowly and as deeply as you can through your mouth, causing the piston or the ball to rise toward the top of the chamber. Hold your breath for 3-5 seconds, or for as long as possible. If the spirometer includes a coach indicator, use this to guide you in breathing. Slow down your breathing if the indicator goes above the marked areas. Remove the mouthpiece from your mouth and breathe out normally. The piston or ball will return to the bottom of the chamber. Rest for a few seconds, then repeat the steps 10 or more times. Take your time and take a few normal breaths between deep breaths so that you do not get dizzy or light-headed. Do this every 1-2 hours when you are awake. If the spirometer includes a goal marker to show the highest number you have reached (best effort), use this as a goal to work toward during each repetition. After each set of 10 deep breaths, cough a few times. This will help to make sure that your lungs are clear. If you have an incision on your chest or abdomen from surgery, place a pillow or a rolled-up towel  firmly against the incision when you cough. This can help to reduce pain while taking deep breaths and coughing. General tips When you are able to get out of bed: Walk around often. Continue to take deep breaths and cough in order to clear your lungs. Keep using the incentive spirometer until your health care provider says it is okay to stop using it. If you have been in the hospital, you may be told to keep using the spirometer at home. Contact a health care provider if: You are having difficulty using the spirometer. You have trouble using the spirometer as often as instructed. Your pain medicine is not giving enough relief for you to use the spirometer as told. You have a fever. Get help right away if: You develop shortness of breath. You develop a cough with bloody mucus from the lungs. You have fluid or blood coming from an incision site after you cough. Summary An incentive spirometer is a tool that can help you learn to take long, deep breaths to keep your lungs clear and active. You may be asked to use a spirometer after a surgery, if you have a lung problem or a history of smoking, or if you have been inactive for a long period of time. Use your incentive spirometer as instructed every 1-2 hours while you are awake. If you have an incision on your chest or abdomen, place a pillow or a rolled-up towel firmly against your incision when you cough. This will help to reduce pain. Get help right away if you have shortness of breath, you cough up bloody mucus, or blood comes from your incision when you cough. This information is not intended to replace advice given to you by your health care provider. Make sure you discuss any questions you have with your health care provider.  Preoperative Educational Videos for Total Hip, Knee and Shoulder Replacements  To better prepare for surgery, please view our videos that explain the physical activity and discharge planning required to have the  best surgical recovery at [facility].  [email or web address]  Questions? Call [phone] or email [email or web address]

## 2023-08-29 NOTE — H&P (Signed)
 ORTHOPAEDIC HISTORY & PHYSICAL Tyffany Waldrop, Adelina Mings., MD - 08/17/2023 10:30 AM EST Formatting of this note is different from the original. Images from the original note were not included. Chief Complaint: Chief Complaint Patient presents with Right knee degenerative arthrosis  Reason for Visit: The patient is a 78 y.o. male who presents today for reevaluation of his right knee. He reports a 1 year history of progressive right knee pain. He does not recall any specific trauma or aggravating event. He localizes most of the pain along the medial aspect of the knee. He reports some swelling, no locking, and some giving way of the knee. The pain is aggravated by going up and down stairs, standing, and walking. The knee pain limits the patient's ability to ambulate long distances. The symptoms are similar to what he experienced with the left knee prior to his left total knee arthroplasty. The patient has not appreciated any significant improvement despite activity modification. He is unable to tolerate NSAIDs due to chronic kidney disease. He is not using any ambulatory aids. The patient states that the knee pain has progressed to the point that it is significantly interfering with his activities of daily living.  Medications: Current Outpatient Medications Medication Sig Dispense Refill albuterol MDI, PROVENTIL, VENTOLIN, PROAIR, HFA 90 mcg/actuation inhaler Inhale 2 inhalations into the lungs every 6 (six) hours as needed 1 each 1 amLODIPine (NORVASC) 5 MG tablet Take 1 tablet (5 mg total) by mouth once daily 90 tablet 1 aspirin 81 MG EC tablet Take 81 mg by mouth once daily blood-glucose sensor (FREESTYLE LIBRE 3 SENSOR) 1 (ONE) KIT APPLIED EVERY 14 DAYS FOR GLUCOSE MONITORING C,E,zinc,copper 11-[id number]-lut (50 PLUS ADULT EYE HEALTH) 250-5-1 mg Cap Take 1 capsule by mouth once daily carvediloL (COREG) 3.125 MG tablet Take 1 tablet (3.125 mg total) by mouth 2 (two) times daily with meals 180  tablet 3 cholecalciferol (VITAMIN D3) 5,000 unit capsule Take 5,000 Units by mouth once daily co-enzyme Q-10, ubiquinone, 100 mg capsule Take 100 mg by mouth once daily colchicine (COLCRYS) 0.6 mg tablet Take 1 tablet by mouth as needed for Gout Pain 2 tablets at the start of Gout Flare up and then 1 more one hour later as needed cyanocobalamin (VITAMIN B12) 1000 MCG tablet Take 1,000 mcg by mouth once daily evolocumab (REPATHA SURECLICK) 140 mg/mL PnIj INJECT 140MG  SUBCUTANEOUSLY EVERY 14 DAYS 6 mL 3 ezetimibe (ZETIA) 10 mg tablet TAKE 1 TABLET BY MOUTH EVERY DAY 90 tablet 4 ferrous sulfate 325 (65 FE) MG tablet Take 325 mg by mouth daily with breakfast flaxseed oiL 1,000 mg Cap Take 1,000 mg by mouth once daily FREESTYLE LIBRE 3 READER Misc 1 (ONE) EACH FOR CONTINUOUS GLUCOSE MONITORING glipiZIDE (GLUCOTROL XL) 10 MG XL tablet Take 1 tablet (10 mg total) by mouth 2 (two) times daily glucosamine HCl/chondroitin su (GLUCOSAMINE-CHONDROITIN ORAL) Take 2 tablets by mouth once daily icosapent ethyL (VASCEPA) 1 gram capsule TAKE 2 CAPSULES BY MOUTH 2 TIMES DAILY WITH MEALS. 360 capsule 3 ketoconazole (NIZORAL) 2 % shampoo Apply topically Apply 1 application topically 2 (two) times a week. magnesium gluconate (MAG-G) 27 mg (500 mg) tablet Take 500 mg by mouth once daily metFORMIN (GLUCOPHAGE-XR) 500 MG XR tablet Take 500 mg by mouth once daily multivit-min-folic-vit K-lycop (MEN'S 50 PLUS DAILY FORMULA) 400-20-370 mcg Tab Take 1 tablet by mouth once daily nitroGLYcerin (NITROSTAT) 0.4 MG SL tablet Place 1 tablet (0.4 mg total) under the tongue every 5 (five) minutes as needed for Chest pain 1  tablet under tongue if needed for chest pain 25 tablet 0 OMEGA-3 FATTY ACIDS-FISH OIL ORAL Take 1,000 mg by mouth once daily ONETOUCH VERIO TEST STRIPS test strip 1 strip 2 (two) times daily pantoprazole (PROTONIX) 40 MG DR tablet Take 40 mg by mouth as needed traMADoL (ULTRAM) 50 mg tablet Take 50 mg by mouth  every 8 (eight) hours as needed  No current facility-administered medications for this visit.  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 09/24/2014 Dr Ernest Pine Limited arthroscopic debridement arthroscopic subacromial decompression,mini-open rotator cuff repair and mini-open biceps tenodesis,right shoulder Right 02/24/2018 Dr.Poggi Limited arthroscopic debridement, arthroscopic subacromial decompression, mini-open rotator cuff repair using a [employer] Regeneten patch, and mini-open biceps tenodesis, left shoulder. Left 04/25/2019 Dr.Poggi Abdominal Surgery APPENDECTOMY CHOLECYSTECTOMY Lower Back Surgery  Social History: Social History  Socioeconomic History Marital status: Widowed Number of children: 4 Years of education: 11.5 Occupational History Occupation: Retired - Conservation officer, nature Tobacco Use Smoking status: Former Current packs/day: 0.00 Types: Cigarettes Quit date: 03/25/1995 Years since quitting: 28.4 Smokeless tobacco: Former Quit date: 03/24/1996 Vaping Use Vaping status: Never Used Substance and Sexual Activity Alcohol use: No Alcohol/week: 0.0 standard drinks of  alcohol Drug use: No Sexual activity: Defer Partners: Female  Social Drivers of Catering manager Strain: Low Risk (05/06/2020) Received from [facility], [facility] Overall Financial Resource Strain (CARDIA) Difficulty of Paying Living Expenses: Not hard at all Food Insecurity: No Food Insecurity (07/15/2023) Hunger Vital Sign Worried About Running Out of Food in the Last Year: Never true Ran Out of Food in the Last Year: Never true Transportation Needs: No Transportation Needs (07/15/2023) PRAPARE - Contractor (Medical): No Lack of Transportation (Non-Medical): No Physical Activity: Insufficiently Active (05/06/2020) Received from [facility], [facility] Exercise Vital Sign Days of Exercise per Week: 4 days Minutes of Exercise per Session: 10 min Stress: No Stress Concern Present (05/06/2020) Received from [facility], [facility] of Occupational Health - Occupational Stress Questionnaire Feeling of Stress : Not at all Social Connections: Moderately Isolated (05/06/2020) Received from [facility], [facility] Social Connection and Isolation Panel [NHANES] Frequency of Communication with Friends and Family: More than three times a week Frequency of Social Gatherings with Friends and Family: More than three times a week Attends Religious Services: Never Database administrator or Organizations: No Attends Banker Meetings: Never Marital Status: Married Housing Stability: Low Risk (07/15/2023) Housing Stability Vital Sign Unable to Pay for Housing in the Last Year: No Number of Times Moved in the Last Year: 0 Homeless in the Last Year: No  Family History: Family History Problem Relation Name Age of Onset Heart failure Mother Liver disease Father Prostate cancer Brother  Review of Systems: A comprehensive 14 point ROS was performed, reviewed, and the pertinent orthopaedic  findings are documented in the HPI.  Exam BP  118/74  Ht 182.9 cm (6')  Wt (!) 108.8 kg (239 lb 12.8 oz)  BMI 32.52 kg/m  General: Well-developed, well-nourished male seen in no acute distress. Antalgic gait. Varus thrust to the right knee.  HEENT: Atraumatic, normocephalic. Pupils are equal and reactive to light. Extraocular motion is intact. Sclera are clear. Oropharynx is clear with moist mucosa.  Neck: Supple, nontender, and with good ROM. No thyromegaly, adenopathy, JVD, or carotid bruits.  Lungs: Clear to auscultation bilaterally.  Cardiovascular: Regular rate and rhythm. Normal S1, S2. No murmur . No appreciable gallops or rubs. Peripheral pulses are palpable. No lower extremity edema. Homan`s test is negative.  Abdomen: Soft, nontender, nondistended. Bowel sounds are present.  Extremities: Good strength, stability, and range of motion of the upper extremities. Good range of motion of the hips and ankles.  Right Knee: Soft tissue swelling: mild Effusion: none Erythema: none Crepitance: mild Tenderness: medial Alignment: relative varus Mediolateral laxity: medial pseudolaxity Posterior sag: negative Patellar tracking: Good tracking without evidence of subluxation or tilt Atrophy: No significant atrophy. Quadriceps tone was good. Range of motion: 0/3/123 degrees  Neurologic: Awake, alert, and oriented. Sensory function is intact to pinprick and light touch. Motor strength is judged to be 5/5. Motor coordination is within normal limits. No apparent clonus. No tremor.  X-rays: I reviewed the right knee radiographs that were performed at [facility] on 07/13/2023. There is n significant arrowing of the medial cartilage space with bone-on-bone articulation and associated varus alignment. Osteophyte formation is noted. Subchondral sclerosis is noted. No evidence of fracture or dislocation.  Impression: Degenerative arthrosis of the right knee  Plan: The  findings were discussed in detail with the patient. The patient was given informational material on total knee replacement. Conservative treatment options were reviewed with the patient. We discussed the risks and benefits of surgical intervention. The usual perioperative course was also discussed in detail. The patient expressed understanding of the risks and benefits of surgical intervention and would like to proceed with plans for right total knee arthroplasty.  Hemoglobin A1c is an indication of glucose control. Uncontrolled diabetes has been associated with perioperative complications including poor wound healing and surgical site infections. To decrease the risk of perioperative complications, hemoglobin A1c must be less than 8.0 prior to surgery. The patient is encouraged to work with his primary care physician and/or endocrinologist to optimize glucose management.  Hemoglobin A1c was 8.2 on 12/18/2022.  I spent a total of 45 minutes in both face-to-face and non-face-to-face activities, excluding procedures performed, for this visit on the date of this encounter.  MEDICAL CLEARANCE: Per anesthesiology. ACTIVITY: As tolerated. WORK STATUS: Not applicable. THERAPY: Preoperative physical therapy evaluation. MEDICATIONS: Requested Prescriptions  No prescriptions requested or ordered in this encounter  FOLLOW-UP: Return for preop History & Physical pending surgery date.  Rian Koon P. Angie Fava., M.D.  This note was generated in part with voice recognition software and I apologize for any typographical errors that were not detected and corrected.  Electronically signed by Shari Heritage., MD at 08/20/2023 8:58 AM EST

## 2023-08-29 NOTE — Discharge Instructions (Signed)
 Instructions after Total Knee Replacement   Emmilia Sowder P. Angie Fava., M.D.    Dept. of Orthopaedics & Sports Medicine [facility] [address] [place], [place]  [address]  Phone: [phone]   Fax: [phone]       [email or web address]       DIET: Drink plenty of non-alcoholic fluids. Resume your normal diet. Include foods high in fiber.  ACTIVITY:  You may use crutches or a walker with weight-bearing as tolerated, unless instructed otherwise. You may be weaned off of the walker or crutches by your Physical Therapist.  Do NOT place pillows under the knee. Anything placed under the knee could limit your ability to straighten the knee.   Use the Bone Foam 3 times a day for 30 minutes each session to help straighten the knee. Continue doing gentle exercises. Exercising will reduce the pain and swelling, increase motion, and prevent muscle weakness.   Please continue to use the TED compression stockings for 6 weeks. You may remove the stockings at night, but should reapply them in the morning. Do not drive or operate any equipment until instructed.  WOUND CARE:  The initial dressing (Aquacel) can remain in place for 7 days (see separate instructions). Continue to use the PolarCare or ice packs periodically to reduce pain and swelling. You may bathe or shower after the staples are removed at the first office visit following surgery.  MEDICATIONS: You may resume your regular medications. Please take the pain medication as prescribed on the medication. Do not take pain medication on an empty stomach. Unless instructed otherwise, you should take an enteric-coated aspirin 81 mg. TWICE a day. (This along with elevation will help reduce the possibility of blood clots/phlebitis in your operated leg.) Use a stool softener (such as Senokot-S or Colace) daily and a laxative (such as Miralax or Dulcolax) as needed to prevent constipation.  Do not drive or drink alcoholic beverages when  taking pain medications.  CALL THE OFFICE FOR: Temperature above 101 degrees Excessive bleeding or drainage on the dressing. Excessive swelling, coldness, or paleness of the toes. Persistent nausea and vomiting.  FOLLOW-UP:  You should have an appointment to return to the office in 10-14 days after surgery. Arrangements have been made for continuation of Physical Therapy (either home therapy or outpatient therapy).     [facility] Department Directory         [email or web address]       [email or web address]          Cardiology  Appointments: [place] - [phone]  Endocrinology  Appointments: [place] [phone] [place] - [phone]  Gastroenterology  Appointments: [employer] [phone] [place] - [phone]        General Surgery   Appointments: [facility]  Internal Medicine/Family Medicine  Appointments: [facility] [place] - [phone] [place] - [phone]  Metabolic and Weigh Loss Surgery  Appointments: [facility]        Neurology  Appointments: [place] [phone] [place] - [phone]  Neurosurgery  Appointments: [place]  Obstetrics & Gynecology  Appointments: [place] [phone] [place] - [phone]        Pediatrics  Appointments: [place] [phone] [place] - [phone]  Physiatry  Appointments: [place] [phone]  Physical Therapy  Appointments: [place] - [phone]        Podiatry  Appointments: [place] [phone] [place] - [phone]  Pulmonology  Appointments: [facility]  Rheumatology  Appointments: [place] [phone]  [place] Location: [facility]  [address] [place], [place]  [address]  [place] Location: [facility]. [address] [place], [place]  [address]  [place]  Location: [facility] [address] [place], [place]  [address]

## 2023-09-01 ENCOUNTER — Encounter: Payer: Self-pay | Admitting: Orthopedic Surgery

## 2023-09-01 NOTE — Progress Notes (Incomplete)
 Perioperative / Anesthesia Services  Pre-Admission Testing Clinical Review / Pre-Operative Anesthesia Consult  Date: 09/01/23  Patient Demographics:  Name: Aaron [name]. DOB: [birth year] MRN:   [id number]  Planned Surgical Procedure(s):    Case: [id number] Date/Time: 09/03/23 0700   Procedure: ARTHROPLASTY, KNEE, TOTAL, USING IMAGELESS COMPUTER-ASSISTED NAVIGATION (Right: Knee)   Anesthesia type: Choice   Diagnosis: Primary osteoarthritis of right knee [M17.11]   Pre-op diagnosis: Primary osteoarthritis of right knee M17.11   Location: [facility] OR ROOM 01 / [facility]   Surgeons: Donato Heinz, MD      NOTE: Available PAT nursing documentation and vital signs have been reviewed. Clinical nursing staff has updated patient's PMH/PSHx, current medication list, and drug allergies/intolerances to ensure comprehensive history available to assist in medical decision making as it pertains to the aforementioned surgical procedure and anticipated anesthetic course. Extensive review of available clinical information personally performed. [place] [facility] and PSHx updated with any diagnoses/procedures that  may have been inadvertently omitted during his intake with the pre-admission testing department's nursing staff.  Clinical Discussion:  Aaron [name]. is a 78 y.o. male who is submitted for pre-surgical anesthesia review and clearance prior to him undergoing the above procedure.Patient is a Former Games developer. Pertinent PMH includes: CAD, MI x 2, ischemic cardiomyopathy HFrEF, palpitations, BILATERAL carotid artery stenosis, unstable angina, HTN, HLD, T2DM, CKD-III, OSAH (no nocturnal PAP therapy), DOE, COPD, GERD (no daily Tx), duodenal ulcer, OA, lumbar DDD, peripheral neuropathy.  Patient is followed by cardiology Juliann Pares, MD). He was last seen in the cardiology clinic on ***; notes reviewed. ***At the time of his clinic visit, patient doing well overall from a  cardiovascular perspective. Patient denied any chest [name], shortness of breath, PND, orthopnea, palpitations, significant peripheral edema, weakness, fatigue, vertiginous symptoms, or presyncope/syncope. Patient with a past medical history significant for cardiovascular diagnoses. Documented physical exam was grossly benign, providing no evidence of acute exacerbation and/or decompensation of the patient's known cardiovascular conditions.  ***  Blood pressure***controlled at *** mmHg on currently prescribed *** therapies.  Patient is on *** for his HLD diagnosis and ASCVD prevention. ***Patient is not diabetic. ***He does not have an OSAH diagnosis. ***FC. No changes were made to his medication regimen during his visit with cardiology.  Patient scheduled to follow-up with outpatient cardiology in***months or sooner if needed.  Aaron [name]. is scheduled for an elective RIGHT TOTAL KNEE ARTHROPLASTY USING IMAGELESS COMPUTER-ASSISTED NAVIGATION on 09/03/2023 with Dr. Francesco Sor, MD. Given patient's past medical history significant for cardiovascular diagnoses, presurgical cardiac clearance was sought by the PAT team. ***  In review of the patient's chart, it is noted that he is on daily oral antithrombotic therapy. Given that patient's past medical history is significant for cardiovascular diagnoses, including but not limited to CAD, orthopedics has cleared patient to continue his daily low dose ASA throughout his perioperative course.  Patient has been updated on these directives from his specialty care providers by the PAT team.  Patient reports previous perioperative complications with anesthesia in the past. Patient has a PMH (+) for PONV. Symptoms and history of PONV will be discussed with patient by anesthesia team on the day of her procedure. Interventions will be ordered as deemed necessary based on patient's individual care needs as determined by anesthesiologist.  Additionally, patient  has a history of (+) anesthesia awareness during a past back surgery. In review his EMR, it is noted that patient underwent a general anesthetic  course at [facility] ([facility]) in 06/2020 without documented complications.      08/27/2023    3:00 PM 08/06/2023    1:37 PM 05/06/2023    3:09 PM  Vitals with BMI  Height 6\' 1"  6\' 1"  6\' 1"   Weight 240 lbs 5 oz 240 lbs 234 lbs  BMI 31.71 31.67 30.88  Systolic 135 113 161  Diastolic 69 72 74  Pulse 68 80 96   Providers/Specialists:  NOTE: Primary physician provider listed below. Patient may have been seen by APP or partner within same practice.   PROVIDER ROLE / SPECIALTY LAST OV  Hooten, Illene Labrador, MD Orthopedics (Surgeon) 08/17/2023  Lonie Peak, [name] Primary Care Provider ???  Rudean Hitt, MD Cardiology 07/19/2023  Sheppard Plumber, FNP-C Vascular Surgery 08/06/2023  Ned Clines, MD Pulmonary Medicine 05/13/2023   Allergies:   Allergies  Allergen Reactions   Ace Inhibitors Other (See Comments)    weakness   Isosorbide     weakness   Losartan     weakness   Metoprolol Tartrate Other (See Comments)    weakness   Statins Nausea And Vomiting and Other (See Comments)    Patient states he gets real weak and headache.   Sulfa Antibiotics Other (See Comments)    Unknown childhood reaction    Current Home Medications:   No current facility-administered medications for this encounter.    amLODipine (NORVASC) 5 MG tablet   aspirin 81 MG chewable tablet   augmented betamethasone dipropionate (DIPROLENE-AF) 0.05 % cream   carvedilol (COREG) 3.125 MG tablet   colchicine 0.6 MG tablet   ezetimibe (ZETIA) 10 MG tablet   glipiZIDE (GLUCOTROL XL) 10 MG 24 hr tablet   indomethacin (INDOCIN) 50 MG capsule   ketoconazole (NIZORAL) 2 % shampoo   metFORMIN (GLUCOPHAGE-XR) 500 MG 24 hr tablet   nitroGLYCERIN (NITROSTAT) 0.4 MG SL tablet   REPATHA SURECLICK 140 MG/ML SOAJ   traMADol (ULTRAM) 50 MG  tablet   VASCEPA 1 g capsule   vitamin B-12 (CYANOCOBALAMIN) 1000 MCG tablet   Coenzyme Q10 (CO Q-10 PO)   Coenzyme Q10-Fish Oil-Vit E (CO-Q 10 OMEGA-3 FISH OIL PO)   Continuous Glucose Receiver (FREESTYLE LIBRE 3 READER) DEVI   Continuous Glucose Sensor (FREESTYLE LIBRE 3 SENSOR) MISC   Flaxseed, Linseed, (FLAXSEED OIL PO)   MANGANESE PO   Menaquinone-7 (VITAMIN K2) 100 MCG CAPS   Omega-3 Fatty Acids (OMEGA-3 FISH OIL PO)   OVER THE COUNTER MEDICATION   Zinc 100 MG TABS   History:   Past Medical History:  Diagnosis Date   Bilateral carotid artery stenosis    Cellulitis    Chronic [name] syndrome    CKD (chronic kidney disease) stage 3, GFR 30-59 ml/min (HCC)    Complication of anesthesia    a.) anesthesia awareness during back surgery; b.) PONV (years ago)   COPD (chronic obstructive pulmonary disease) (HCC)    Coronary artery disease 2002   a.) PCI 2002: stent (unk type) mLAD: b.) PCI 10/21/15: 95 mRCA (3.5x28 Xience Alpine DES); c.) LHC 10/07/16: MV CAD, 15 ISR mLAD, 10 ISR mRCA; d.) PCI 06/13/19: 99 dRCA (2.5x15 mm Res Onyx DES), 15 ISR mLAD; e.) PCI 12/20/20: 80 p-mLAD (3.0x12 Res Onyx DES), 60 p-mLCx (2.75x34 Res Onyx), 99 m-dLCx (covered by p-mLCx DES); f.) LHC 03/09/2023: 50 pRCA, 50 dRCA, 30 p-mLCx, 50 OM1, 40 mLAD - med mgmt   DDD (degenerative disc disease), lumbar    Diabetic peripheral  neuropathy (HCC)    DM (diabetes mellitus), type 2 (HCC)    DOE (dyspnea on exertion)    Duodenal ulcer    Failed back surgical syndrome    Family history of adverse reaction to anesthesia    a.) father with (+) delayed/prolonged emergence   Former smoker    GERD (gastroesophageal reflux disease)    Gout    H/O unstable angina    HFrEF (heart failure with reduced ejection fraction) (HCC)    History of COVID-19 11/2020   History of heart artery stent    a.) TOTAL OF 5 stents as of 09/01/2023   Hyperlipidemia    Hypertension    Ischemic cardiomyopathy    a.) MV 10/17/15: EF 48%;  b.) LHC 10/21/15: EF 50%;  c.) stress TTE 09/29/16: EF 50%; d.) LHC 10/07/16: EF 45%; e.) MV 09/06/2018: EF 42%;  f.) LHC 06/13/19: EF 40%;  g.) TTE 01/29/20: EF 40%; h.) TTE 07/22/20: EF 40%; i.) MV 07/22/20: EF 49%;  j.) LHC 12/20/20: EF 45-50%;  k.) TTE 10/01/21: EF 40%;  l.) MV 10/01/21: EF 39%;  m.) MV 01/05/23: EF 45-50%;  n.) TTE 01/05/23: EF >55%; o.) LHC 03/09/23: EF 35-45%   Long term current use of aspirin    NSTEMI (non-ST elevated myocardial infarction) (HCC) 06/12/2019   a.) troponins were trended: 169 --> 1013 --> 5254 --> 8718 ng/L; b.) LHC/PIC 06/13/2019: 15% ISR mLAD, 90% D1, 25% o=pLCx, 60% m-dLCx, 70% p-mLCx, 10% ISR mRCA, 99% dRCA (2.5 x 15 mm Resolute Onyx DES)   NSTEMI (non-ST elevated myocardial infarction) (HCC) 12/20/2020   a.) troponins were trended: 141 -- 4261 -- >24,000 -- >24,000 -- 16649 ng/L; b.) PCI 12/20/2020: 20% m-dLM, 30% oLAD, 10% ISR mLAD, 30% m-dLAD, 60% p-mLCx (2.75 x 24 mm Resolute Onyx DES), 99% m-dLCx (covered by p-mLCx DES), 80% p-mLAD (3.0 x 12 mm Resolute Onyx DES), 50% pRCA, 10% ISR p-mRCA   Osteoarthritis    Palpitations (PACs/PVCs)    Perforated intestine (HCC)    PONV (postoperative nausea and vomiting)    Rotator cuff tendonitis    Sleep apnea    a.) no nocturnal PAP therapy   Statin intolerance    Past Surgical History:  Procedure Laterality Date   ABDOMINAL SURGERY     perforated intestine   APPENDECTOMY     BACK SURGERY     Lumbar x3   CARDIAC CATHETERIZATION Left 10/21/2015   Procedure: Left Heart Cath and Coronary Angiography;  Surgeon: Lamar Blinks, MD;  Location: [facility];  Service: Cardiovascular;  Laterality: Left;   CARDIAC CATHETERIZATION N/A 10/21/2015   Procedure: Coronary Stent Intervention;  Surgeon: Marcina Millard, MD;  Location: [facility];  Service: Cardiovascular;  Laterality: N/A;   CHOLECYSTECTOMY     COLONOSCOPY     CORONARY ANGIOPLASTY WITH STENT PLACEMENT Left 2002   CORONARY STENT INTERVENTION  N/A 06/13/2019   Procedure: CORONARY STENT INTERVENTION;  Surgeon: Marcina Millard, MD;  Location: [facility];  Service: Cardiovascular;  Laterality: N/A;   CORONARY STENT INTERVENTION N/A 12/20/2020   Procedure: CORONARY STENT INTERVENTION;  Surgeon: Iran Ouch, MD;  Location: [facility];  Service: Cardiovascular;  Laterality: N/A;   LEFT HEART CATH AND CORONARY ANGIOGRAPHY Left 10/07/2016   Procedure: Left Heart Cath and Coronary Angiography;  Surgeon: Lamar Blinks, MD;  Location: [facility];  Service: Cardiovascular;  Laterality: Left;   LEFT HEART CATH AND CORONARY ANGIOGRAPHY N/A 06/13/2019  Procedure: LEFT HEART CATH AND CORONARY ANGIOGRAPHY;  Surgeon: Lamar Blinks, MD;  Location: [facility];  Service: Cardiovascular;  Laterality: N/A;   LEFT HEART CATH AND CORONARY ANGIOGRAPHY N/A 12/20/2020   Procedure: LEFT HEART CATH AND CORONARY ANGIOGRAPHY;  Surgeon: Iran Ouch, MD;  Location: [facility];  Service: Cardiovascular;  Laterality: N/A;   LEFT HEART CATH AND CORONARY ANGIOGRAPHY Left 03/09/2023   Procedure: LEFT HEART CATH AND CORONARY ANGIOGRAPHY;  Surgeon: Marcina Millard, MD;  Location: [facility];  Service: Cardiovascular;  Laterality: Left;   SHOULDER ARTHROSCOPY WITH SUBACROMIAL DECOMPRESSION AND BICEP TENDON REPAIR Right 02/24/2018   Procedure: SHOULDER ARTHROSCOPY WITH SUBACROMIAL DEBRIDEMENT, DECOMPRESSION, ROTATOR CUFF REPAIR AND POSSIBLE BICEP TENODESIS;  Surgeon: Christena Flake, MD;  Location: [facility] OR[facility];  Service: Orthopedics;  Laterality: Right;   SHOULDER ARTHROSCOPY WITH SUBACROMIAL DECOMPRESSION AND BICEP TENDON REPAIR Left 04/25/2019   Procedure: SHOULDER ARTHROSCOPY WITH DEBRIDEMENT, DECOMPRESSION AND BICEP TENDON REPAIR, ROTATOR CUFF REPAIR;  Surgeon: Christena Flake, MD;  Location: [facility] OR[facility];  Service: Orthopedics;  Laterality: Left;   TONSILLECTOMY     Family History  Problem  Relation Age of Onset   Angina Mother    Social History   Tobacco Use   Smoking status: Former    Types: Cigarettes   Smokeless tobacco: Former   Tobacco comments:    7/20 quit 35 years ago  Substance Use Topics   Alcohol use: No   Pertinent Clinical Results:  LABS:  Hospital Outpatient Visit on 08/27/2023  Component Date Value Ref Range Status   MRSA, PCR 08/27/2023 NEGATIVE  NEGATIVE Final   Staphylococcus aureus 08/27/2023 NEGATIVE  NEGATIVE Final   Comment: (NOTE) The Xpert SA Assay (FDA approved for NASAL specimens in patients 80 years of age and older), is one component of a comprehensive surveillance program. It is not intended to diagnose infection nor to guide or monitor treatment. Performed at [facility], [address] [place], [place] [address]    CRP 08/27/2023 0.6  <1.0 mg/dL Final   Performed at [facility], [address] [place], [place] [address]   Sed Rate 08/27/2023 8  0 - 20 mm/hr Final   Performed at [facility], [address] [place], [place] [address]   Hgb A1c MFr Bld 08/27/2023 6.6 (H)  4.8 - 5.6 % Final   Comment: (NOTE) Pre diabetes:          5.7%-6.4%  Diabetes:              >6.4%  Glycemic control for   <7.0% adults with diabetes    Mean Plasma Glucose 08/27/2023 142.72  mg/dL Final   Performed at [facility], [address] [place], [place] [address]   WBC 08/27/2023 6.1  4.0 - 10.5 K/uL Final   RBC 08/27/2023 4.66  4.22 - 5.81 MIL/uL Final   Hemoglobin 08/27/2023 14.2  13.0 - 17.0 g/dL Final   HCT 96/29/5284 41.4  39.0 - 52.0 % Final   MCV 08/27/2023 88.8  80.0 - 100.0 fL Final   MCH 08/27/2023 30.5  26.0 - 34.0 pg Final   MCHC 08/27/2023 34.3  30.0 - 36.0 g/dL Final   RDW 13/24/4010 14.0  11.5 - 15.5 % Final   Platelets 08/27/2023 206  150 - 400 K/uL Final   nRBC 08/27/2023 0.0  0.0 - 0.2 % Final   Performed at [facility], [address] [place],  [place] [address]   Sodium  08/27/2023 138  135 - 145 mmol/L Final   Potassium 08/27/2023 4.4  3.5 - 5.1 mmol/L Final   Chloride 08/27/2023 104  98 - 111 mmol/L Final   CO2 08/27/2023 24  22 - 32 mmol/L Final   Glucose, Bld 08/27/2023 158 (H)  70 - 99 mg/dL Final   Glucose reference range applies only to samples taken after fasting for at least 8 hours.   BUN 08/27/2023 27 (H)  8 - 23 mg/dL Final   Creatinine, Ser 08/27/2023 1.22  0.61 - 1.24 mg/dL Final   Calcium 60/45/4098 10.0  8.9 - 10.3 mg/dL Final   Total Protein 11/91/4782 7.7  6.5 - 8.1 g/dL Final   Albumin 95/62/1308 4.5  3.5 - 5.0 g/dL Final   AST 65/78/4696 22  15 - 41 U/L Final   ALT 08/27/2023 26  0 - 44 U/L Final   Alkaline Phosphatase 08/27/2023 50  38 - 126 U/L Final   Total Bilirubin 08/27/2023 0.9  0.0 - 1.2 mg/dL Final   GFR, Estimated 08/27/2023 >60  >60 mL/min Final   Comment: (NOTE) Calculated using the CKD-EPI Creatinine Equation (2021)    Anion gap 08/27/2023 10  5 - 15 Final   Performed at [facility], [address] [place], [place] [address]   Color, Urine 08/27/2023 YELLOW (A)  YELLOW Final   APPearance 08/27/2023 HAZY (A)  CLEAR Final   Specific Gravity, Urine 08/27/2023 1.013  1.005 - 1.030 Final   pH 08/27/2023 5.0  5.0 - 8.0 Final   Glucose, UA 08/27/2023 50 (A)  NEGATIVE mg/dL Final   Hgb urine dipstick 08/27/2023 SMALL (A)  NEGATIVE Final   Bilirubin Urine 08/27/2023 NEGATIVE  NEGATIVE Final   Ketones, ur 08/27/2023 NEGATIVE  NEGATIVE mg/dL Final   Protein, ur 41/32/4401 30 (A)  NEGATIVE mg/dL Final   Nitrite 02/72/5366 NEGATIVE  NEGATIVE Final   Leukocytes,Ua 08/27/2023 NEGATIVE  NEGATIVE Final   RBC / HPF 08/27/2023 0-5  0 - 5 RBC/hpf Final   WBC, UA 08/27/2023 0-5  0 - 5 WBC/hpf Final   Bacteria, UA 08/27/2023 NONE SEEN  NONE SEEN Final   Squamous Epithelial / HPF 08/27/2023 0  0 - 5 /HPF Final   Mucus 08/27/2023 PRESENT   Final   Sperm, UA 08/27/2023 PRESENT   Final   Performed at [facility],  [address] [place], [place] [address]    ECG: Date: 06/07/2023  Time ECG obtained: 1256 PM Rate: 80 bpm Rhythm: normal sinus Axis (leads I and aVF): left Intervals: PR 196 ms. QRS 104 ms. QTc 431 ms. ST segment and T wave changes: No evidence of acute T wave abnormalities or significant ST segment elevation or depression.  Evidence of a possible, age undetermined, prior infarct:  Yes; lateral Comparison: Similar to previous tracing obtained on 12/16/2022   IMAGING / PROCEDURES: VAS US CAROTID performed on 08/06/2023 Velocities in the right ICA are consistent with a 1-39% stenosis. Non-hemodynamically significant plaque <50% noted in the CCA. The ECA appears <50% stenosed.  Velocities in the left ICA are consistent with a 1-39% stenosis.  Non-hemodynamically significant plaque <50% noted in the CCA. The ECA appears <50% stenosed.  Bilateral vertebral arteries demonstrate antegrade flow.  Normal flow hemodynamics were seen in bilateral subclavian arteries.   DIAGNOSTIC RADIOGRAPHS OF RIGHT KNEE 3 VIEWS performed on 07/13/2023 Noted to be bone-on-bone to the medial compartment space.  Does have arthritis in the tricompartmental flexion.  Some subchondral sclerosis is noted to the medial tibial plateau.  There is also some small osteophyte formation to the medial femoral condyle as well as patella femoral articulation.   LEFT HEART CATHETERIZATION AND CORONARY ANGIOGRAPHY performed on 03/09/2023 Mild to moderate left ventricular systolic dysfunction with an EF of 35-45% Multivessel CAD Prox RCA lesion is 50% stenosed. Dist RCA lesion is 60% stenosed. Prox Cx to Mid Cx lesion is 30% stenosed. 1st Mrg lesion is 50% stenosed. Mid LAD lesion is 40% stenosed. Previously placed Prox RCA to Mid RCA stent of unknown type is  widely patent. Previously placed Prox LAD stent of unknown type is  widely patent.    MYOCARDIAL PERFUSION IMAGING STUDY (LEXISCAN) performed on  01/05/2023 Abnormal echo perfusion scan there is evidence of inferior moderate defect without any significant redistribution appears to be fixed  Left ventricular enlargement no evidence of reversible ischemia inferiorly consistent with possible scar versus artifact. Ejection fraction is mildly diminished at around 45 to 50%.   This is a intermediate risk area   TRANSTHORACIC ECHOCARDIOGRAM performed on 01/05/2023 Normal left ventricular systolic function with an EF of >55% No regional wall motion abnormalities Left ventricular diastolic Doppler parameters consistent with abnormal relaxation (G1DD). Right ventricular size and function normal; TAPSE 2.4 cm Trivial mitral, tricuspid, and pulmonary valve regurgitation Normal gradients; no valvular stenosis No pericardial effusion  Impression and Plan:  Aaron [name]. has been referred for pre-anesthesia review and clearance prior to him undergoing the planned anesthetic and procedural courses. Available labs, pertinent testing, and imaging results were personally reviewed by me in preparation for upcoming operative/procedural course. [facility] medical record has been updated following extensive record review and patient interview with PAT staff.   ATTENTION --> PENDING CLEARANCE AT THIS TIME -- NOTE/CONTENTS NOT FINAL UNTIL SIGNED This patient has been appropriately cleared by cardiology with an overall *** risk of experiencing significant perioperative cardiovascular complications. Based on clinical review performed today (09/01/23), barring any significant acute changes in the patient's overall condition, it is anticipated that he will be able to proceed with the planned surgical intervention. Any acute changes in clinical condition may necessitate his procedure being postponed and/or cancelled. Patient will meet with anesthesia team (MD and/or CRNA) on the day of his procedure for preoperative evaluation/assessment. Questions regarding  anesthetic course will be fielded at that time.   Pre-surgical instructions were reviewed with the patient during his PAT appointment, and questions were fielded to satisfaction by PAT clinical staff. He has been instructed on which medications that he will need to hold prior to surgery, as well as the ones that have been deemed safe/appropriate to take on the day of his procedure. As part of the general education provided by PAT, patient made aware both verbally and in writing, that he would need to abstain from the use of any illegal substances during his perioperative course. He was advised that failure to follow the provided instructions could necessitate case cancellation or result in serious perioperative complications up to and including death. Patient encouraged to contact PAT and/or his surgeon's office to discuss any questions or concerns that may arise prior to surgery; verbalized understanding.   Quentin Mulling, MSN, APRN, FNP-C, CEN [facility]  Perioperative Services Nurse Practitioner Phone: [phone] Fax: [phone] 09/01/23 1:48 PM  NOTE: This note has been prepared using Dragon dictation software. Despite my best ability to proofread, there is always the potential that unintentional transcriptional errors may still occur  from this process.

## 2023-09-02 ENCOUNTER — Encounter: Payer: Self-pay | Admitting: Orthopedic Surgery

## 2023-09-02 MED ORDER — CHLORHEXIDINE GLUCONATE 0.12 % MT SOLN
15.0000 mL | Freq: Once | OROMUCOSAL | Status: AC
  Administered 2023-09-03: 15 mL via OROMUCOSAL

## 2023-09-02 MED ORDER — CHLORHEXIDINE GLUCONATE 4 % EX SOLN: 60.0000 mL | Freq: Once | CUTANEOUS | Status: DC

## 2023-09-02 MED ORDER — TRANEXAMIC ACID-NACL 1000-0.7 MG/100ML-% IV SOLN
1000.0000 mg | INTRAVENOUS | Status: AC
  Administered 2023-09-03: 1000 mg via INTRAVENOUS

## 2023-09-02 MED ORDER — CEFAZOLIN SODIUM-DEXTROSE 2-4 GM/100ML-% IV SOLN
2.0000 g | INTRAVENOUS | Status: AC
  Administered 2023-09-03: 2 g via INTRAVENOUS

## 2023-09-02 MED ORDER — GABAPENTIN 300 MG PO CAPS
300.0000 mg | ORAL_CAPSULE | Freq: Once | ORAL | Status: AC
  Administered 2023-09-03: 300 mg via ORAL

## 2023-09-02 MED ORDER — CELECOXIB 200 MG PO CAPS
400.0000 mg | ORAL_CAPSULE | Freq: Once | ORAL | Status: AC
  Administered 2023-09-03: 400 mg via ORAL

## 2023-09-02 MED ORDER — ORAL CARE MOUTH RINSE: 15.0000 mL | Freq: Once | OROMUCOSAL | Status: AC

## 2023-09-02 MED ORDER — SODIUM CHLORIDE 0.9 % IV SOLN: INTRAVENOUS | Status: DC

## 2023-09-02 MED ORDER — DEXAMETHASONE SODIUM PHOSPHATE 10 MG/ML IJ SOLN
8.0000 mg | Freq: Once | INTRAMUSCULAR | Status: AC
  Administered 2023-09-03: 8 mg via INTRAVENOUS

## 2023-09-03 ENCOUNTER — Other Ambulatory Visit: Payer: Self-pay

## 2023-09-03 ENCOUNTER — Encounter: Payer: Self-pay | Admitting: Orthopedic Surgery

## 2023-09-03 ENCOUNTER — Observation Stay
Admission: RE | Admit: 2023-09-03 | Discharge: 2023-09-04 | Disposition: A | Discharge status: Home / Self Care | Source: Ambulatory Visit | Attending: Orthopedic Surgery | Admitting: Orthopedic Surgery

## 2023-09-03 ENCOUNTER — Ambulatory Visit: Payer: Self-pay | Admitting: Urgent Care

## 2023-09-03 ENCOUNTER — Encounter
Admission: RE | Disposition: A | Discharge status: Home / Self Care | Payer: Self-pay | Source: Ambulatory Visit | Attending: Orthopedic Surgery

## 2023-09-03 ENCOUNTER — Observation Stay

## 2023-09-03 DIAGNOSIS — Z87891 Personal history of nicotine dependence: Secondary | ICD-10-CM | POA: Diagnosis not present

## 2023-09-03 DIAGNOSIS — Z7982 Long term (current) use of aspirin: Secondary | ICD-10-CM | POA: Diagnosis not present

## 2023-09-03 DIAGNOSIS — I5022 Chronic systolic (congestive) heart failure: Secondary | ICD-10-CM | POA: Diagnosis not present

## 2023-09-03 DIAGNOSIS — E119 Type 2 diabetes mellitus without complications: Secondary | ICD-10-CM | POA: Diagnosis not present

## 2023-09-03 DIAGNOSIS — I10 Essential (primary) hypertension: Secondary | ICD-10-CM | POA: Diagnosis not present

## 2023-09-03 DIAGNOSIS — E1129 Type 2 diabetes mellitus with other diabetic kidney complication: Secondary | ICD-10-CM

## 2023-09-03 DIAGNOSIS — Z79899 Other long term (current) drug therapy: Secondary | ICD-10-CM | POA: Insufficient documentation

## 2023-09-03 DIAGNOSIS — Z8616 Personal history of COVID-19: Secondary | ICD-10-CM | POA: Diagnosis not present

## 2023-09-03 DIAGNOSIS — I130 Hypertensive heart and chronic kidney disease with heart failure and stage 1 through stage 4 chronic kidney disease, or unspecified chronic kidney disease: Secondary | ICD-10-CM | POA: Insufficient documentation

## 2023-09-03 DIAGNOSIS — I2510 Atherosclerotic heart disease of native coronary artery without angina pectoris: Secondary | ICD-10-CM | POA: Insufficient documentation

## 2023-09-03 DIAGNOSIS — Z96651 Presence of right artificial knee joint: Secondary | ICD-10-CM

## 2023-09-03 DIAGNOSIS — Z96652 Presence of left artificial knee joint: Secondary | ICD-10-CM | POA: Diagnosis not present

## 2023-09-03 DIAGNOSIS — Z7984 Long term (current) use of oral hypoglycemic drugs: Secondary | ICD-10-CM | POA: Insufficient documentation

## 2023-09-03 DIAGNOSIS — M1711 Unilateral primary osteoarthritis, right knee: Principal | ICD-10-CM | POA: Insufficient documentation

## 2023-09-03 DIAGNOSIS — Z01818 Encounter for other preprocedural examination: Secondary | ICD-10-CM

## 2023-09-03 DIAGNOSIS — Z01810 Encounter for preprocedural cardiovascular examination: Secondary | ICD-10-CM | POA: Diagnosis not present

## 2023-09-03 DIAGNOSIS — R195 Other fecal abnormalities: Secondary | ICD-10-CM

## 2023-09-03 DIAGNOSIS — I255 Ischemic cardiomyopathy: Secondary | ICD-10-CM

## 2023-09-03 HISTORY — DX: Other forms of dyspnea: R06.09

## 2023-09-03 HISTORY — DX: Long term (current) use of aspirin: Z79.82

## 2023-09-03 HISTORY — PX: KNEE ARTHROPLASTY: SHX992

## 2023-09-03 HISTORY — DX: Other specified health status: Z78.9

## 2023-09-03 HISTORY — DX: Unspecified osteoarthritis, unspecified site: M19.90

## 2023-09-03 HISTORY — DX: Unspecified systolic (congestive) heart failure: I50.20

## 2023-09-03 HISTORY — DX: Presence of coronary angioplasty implant and graft: Z95.5

## 2023-09-03 HISTORY — DX: Palpitations: R00.2

## 2023-09-03 LAB — GLUCOSE, CAPILLARY
Glucose-Capillary: 154 mg/dL — ABNORMAL HIGH (ref 70–99)
Glucose-Capillary: 252 mg/dL — ABNORMAL HIGH (ref 70–99)
Glucose-Capillary: 264 mg/dL — ABNORMAL HIGH (ref 70–99)
Glucose-Capillary: 278 mg/dL — ABNORMAL HIGH (ref 70–99)

## 2023-09-03 SURGERY — ARTHROPLASTY, KNEE, TOTAL, USING IMAGELESS COMPUTER-ASSISTED NAVIGATION
Anesthesia: Spinal | Site: Knee | Laterality: Right

## 2023-09-03 MED ORDER — TRANEXAMIC ACID-NACL 1000-0.7 MG/100ML-% IV SOLN
INTRAVENOUS | Status: AC
Start: 2023-09-03 — End: ?
  Filled 2023-09-03: qty 100

## 2023-09-03 MED ORDER — GABAPENTIN 300 MG PO CAPS
ORAL_CAPSULE | ORAL | Status: AC
  Filled 2023-09-03: qty 1

## 2023-09-03 MED ORDER — ICOSAPENT ETHYL 1 G PO CAPS
2.0000 g | ORAL_CAPSULE | Freq: Two times a day (BID) | ORAL | Status: DC
  Administered 2023-09-03 – 2023-09-04 (×2): 2 g via ORAL
  Filled 2023-09-03 (×2): qty 2

## 2023-09-03 MED ORDER — OXYCODONE HCL 5 MG PO TABS
5.0000 mg | ORAL_TABLET | ORAL | Status: DC | PRN
  Administered 2023-09-03 – 2023-09-04 (×4): 5 mg via ORAL
  Filled 2023-09-03 (×3): qty 1

## 2023-09-03 MED ORDER — CEFAZOLIN SODIUM-DEXTROSE 2-4 GM/100ML-% IV SOLN
INTRAVENOUS | Status: AC
  Filled 2023-09-03: qty 100

## 2023-09-03 MED ORDER — CARVEDILOL 3.125 MG PO TABS
3.1250 mg | ORAL_TABLET | Freq: Two times a day (BID) | ORAL | Status: DC
  Administered 2023-09-03: 3.125 mg via ORAL
  Filled 2023-09-03: qty 1

## 2023-09-03 MED ORDER — SODIUM CHLORIDE 0.9 % IR SOLN
Status: DC | PRN
  Administered 2023-09-03: 3000 mL

## 2023-09-03 MED ORDER — ONDANSETRON HCL 4 MG/2ML IJ SOLN: 4.0000 mg | Freq: Four times a day (QID) | INTRAMUSCULAR | Status: DC | PRN

## 2023-09-03 MED ORDER — PROPOFOL 500 MG/50ML IV EMUL
INTRAVENOUS | Status: DC | PRN
  Administered 2023-09-03: 120 ug/kg/min via INTRAVENOUS

## 2023-09-03 MED ORDER — METFORMIN HCL ER 500 MG PO TB24
1000.0000 mg | ORAL_TABLET | Freq: Two times a day (BID) | ORAL | Status: DC
  Administered 2023-09-03 – 2023-09-04 (×2): 1000 mg via ORAL
  Filled 2023-09-03 (×4): qty 2

## 2023-09-03 MED ORDER — BUPIVACAINE HCL (PF) 0.5 % IJ SOLN
INTRAMUSCULAR | Status: AC
  Filled 2023-09-03: qty 10

## 2023-09-03 MED ORDER — TRANEXAMIC ACID-NACL 1000-0.7 MG/100ML-% IV SOLN
INTRAVENOUS | Status: AC
  Filled 2023-09-03: qty 100

## 2023-09-03 MED ORDER — FLAXSEED OIL 1000 MG PO CAPS: 1.0000 | ORAL_CAPSULE | Freq: Every day | ORAL | Status: DC

## 2023-09-03 MED ORDER — MENTHOL 3 MG MT LOZG: 1.0000 | LOZENGE | OROMUCOSAL | Status: DC | PRN

## 2023-09-03 MED ORDER — ONDANSETRON HCL 4 MG PO TABS: 4.0000 mg | ORAL_TABLET | Freq: Four times a day (QID) | ORAL | Status: DC | PRN

## 2023-09-03 MED ORDER — PROPOFOL 10 MG/ML IV BOLUS
INTRAVENOUS | Status: AC
  Filled 2023-09-03: qty 20

## 2023-09-03 MED ORDER — PHENOL 1.4 % MT LIQD: 1.0000 | OROMUCOSAL | Status: DC | PRN

## 2023-09-03 MED ORDER — DEXAMETHASONE SODIUM PHOSPHATE 10 MG/ML IJ SOLN
INTRAMUSCULAR | Status: AC
  Filled 2023-09-03: qty 1

## 2023-09-03 MED ORDER — DIPHENHYDRAMINE HCL 12.5 MG/5ML PO ELIX: 12.5000 mg | ORAL_SOLUTION | ORAL | Status: DC | PRN

## 2023-09-03 MED ORDER — MIDAZOLAM HCL 5 MG/5ML IJ SOLN
INTRAMUSCULAR | Status: DC | PRN
  Administered 2023-09-03: 2 mg via INTRAVENOUS

## 2023-09-03 MED ORDER — MAGNESIUM HYDROXIDE 400 MG/5ML PO SUSP
30.0000 mL | Freq: Every day | ORAL | Status: DC
Start: 2023-09-03 — End: 2023-09-04
  Administered 2023-09-03 – 2023-09-04 (×2): 30 mL via ORAL
  Filled 2023-09-03 (×2): qty 30

## 2023-09-03 MED ORDER — BISACODYL 10 MG RE SUPP: 10.0000 mg | Freq: Every day | RECTAL | Status: DC | PRN

## 2023-09-03 MED ORDER — SODIUM CHLORIDE (PF) 0.9 % IJ SOLN
INTRAMUSCULAR | Status: DC | PRN
  Administered 2023-09-03: 120 mL via INTRAMUSCULAR

## 2023-09-03 MED ORDER — BUPIVACAINE LIPOSOME 1.3 % IJ SUSP
INTRAMUSCULAR | Status: AC
  Filled 2023-09-03: qty 20

## 2023-09-03 MED ORDER — LIDOCAINE HCL (PF) 2 % IJ SOLN
INTRAMUSCULAR | Status: AC
  Filled 2023-09-03: qty 5

## 2023-09-03 MED ORDER — SENNOSIDES-DOCUSATE SODIUM 8.6-50 MG PO TABS
1.0000 | ORAL_TABLET | Freq: Two times a day (BID) | ORAL | Status: DC
  Administered 2023-09-03 – 2023-09-04 (×3): 1 via ORAL
  Filled 2023-09-03 (×3): qty 1

## 2023-09-03 MED ORDER — CELECOXIB 200 MG PO CAPS
ORAL_CAPSULE | ORAL | Status: AC
  Filled 2023-09-03: qty 2

## 2023-09-03 MED ORDER — ASPIRIN 81 MG PO CHEW
81.0000 mg | CHEWABLE_TABLET | Freq: Two times a day (BID) | ORAL | Status: DC
  Administered 2023-09-03 – 2023-09-04 (×2): 81 mg via ORAL
  Filled 2023-09-03 (×2): qty 1

## 2023-09-03 MED ORDER — TRANEXAMIC ACID-NACL 1000-0.7 MG/100ML-% IV SOLN
1000.0000 mg | Freq: Once | INTRAVENOUS | Status: AC
  Administered 2023-09-03: 1000 mg via INTRAVENOUS

## 2023-09-03 MED ORDER — AMLODIPINE BESYLATE 5 MG PO TABS: 5.0000 mg | ORAL_TABLET | ORAL | Status: DC

## 2023-09-03 MED ORDER — FENTANYL CITRATE (PF) 100 MCG/2ML IJ SOLN: 25.0000 ug | INTRAMUSCULAR | Status: DC | PRN

## 2023-09-03 MED ORDER — BUPIVACAINE HCL (PF) 0.5 % IJ SOLN
INTRAMUSCULAR | Status: DC | PRN
  Administered 2023-09-03: 3 mL

## 2023-09-03 MED ORDER — ACETAMINOPHEN 325 MG PO TABS: 325.0000 mg | ORAL_TABLET | Freq: Four times a day (QID) | ORAL | Status: DC | PRN

## 2023-09-03 MED ORDER — TRAMADOL HCL 50 MG PO TABS
50.0000 mg | ORAL_TABLET | ORAL | Status: DC | PRN
  Administered 2023-09-03: 50 mg via ORAL

## 2023-09-03 MED ORDER — KETOROLAC TROMETHAMINE 0.5 % OP SOLN
1.0000 [drp] | Freq: Four times a day (QID) | OPHTHALMIC | Status: AC
  Administered 2023-09-03 – 2023-09-04 (×4): 1 [drp] via OPHTHALMIC
  Filled 2023-09-03: qty 3

## 2023-09-03 MED ORDER — ONDANSETRON HCL 4 MG/2ML IJ SOLN
INTRAMUSCULAR | Status: DC | PRN
  Administered 2023-09-03: 4 mg via INTRAVENOUS

## 2023-09-03 MED ORDER — HYDROMORPHONE HCL 1 MG/ML IJ SOLN: 0.5000 mg | INTRAMUSCULAR | Status: DC | PRN

## 2023-09-03 MED ORDER — FLEET ENEMA RE ENEM: 1.0000 | ENEMA | Freq: Once | RECTAL | Status: DC | PRN

## 2023-09-03 MED ORDER — LIDOCAINE HCL (CARDIAC) PF 100 MG/5ML IV SOSY
PREFILLED_SYRINGE | INTRAVENOUS | Status: DC | PRN
Start: 2023-09-03 — End: 2023-09-03
  Administered 2023-09-03: 30 mg via INTRAVENOUS

## 2023-09-03 MED ORDER — ACETAMINOPHEN 10 MG/ML IV SOLN
INTRAVENOUS | Status: AC
  Filled 2023-09-03: qty 100

## 2023-09-03 MED ORDER — BUPIVACAINE HCL (PF) 0.25 % IJ SOLN
INTRAMUSCULAR | Status: AC
  Filled 2023-09-03: qty 60

## 2023-09-03 MED ORDER — SURGIRINSE WOUND IRRIGATION SYSTEM - OPTIME
TOPICAL | Status: DC | PRN
  Administered 2023-09-03: 900 mL

## 2023-09-03 MED ORDER — PANTOPRAZOLE SODIUM 40 MG PO TBEC
40.0000 mg | DELAYED_RELEASE_TABLET | Freq: Two times a day (BID) | ORAL | Status: DC
  Administered 2023-09-03 – 2023-09-04 (×3): 40 mg via ORAL
  Filled 2023-09-03 (×3): qty 1

## 2023-09-03 MED ORDER — TRAMADOL HCL 50 MG PO TABS
ORAL_TABLET | ORAL | Status: AC
  Filled 2023-09-03: qty 1

## 2023-09-03 MED ORDER — CHLORHEXIDINE GLUCONATE 0.12 % MT SOLN
OROMUCOSAL | Status: AC
  Filled 2023-09-03: qty 15

## 2023-09-03 MED ORDER — METOCLOPRAMIDE HCL 5 MG PO TABS
10.0000 mg | ORAL_TABLET | Freq: Three times a day (TID) | ORAL | Status: DC
  Administered 2023-09-03 – 2023-09-04 (×4): 10 mg via ORAL
  Filled 2023-09-03 (×4): qty 2

## 2023-09-03 MED ORDER — VITAMIN K2 100 MCG PO CAPS: 1.0000 | ORAL_CAPSULE | Freq: Every day | ORAL | Status: DC

## 2023-09-03 MED ORDER — SODIUM CHLORIDE 0.9 % IV SOLN: INTRAVENOUS | Status: DC

## 2023-09-03 MED ORDER — MIDAZOLAM HCL 2 MG/2ML IJ SOLN
INTRAMUSCULAR | Status: AC
  Filled 2023-09-03: qty 2

## 2023-09-03 MED ORDER — ACETAMINOPHEN 10 MG/ML IV SOLN
INTRAVENOUS | Status: DC | PRN
  Administered 2023-09-03: 1000 mg via INTRAVENOUS

## 2023-09-03 MED ORDER — FERROUS SULFATE 325 (65 FE) MG PO TABS
325.0000 mg | ORAL_TABLET | Freq: Two times a day (BID) | ORAL | Status: DC
  Administered 2023-09-03 – 2023-09-04 (×2): 325 mg via ORAL
  Filled 2023-09-03 (×2): qty 1

## 2023-09-03 MED ORDER — FENTANYL CITRATE (PF) 100 MCG/2ML IJ SOLN
INTRAMUSCULAR | Status: AC
  Filled 2023-09-03: qty 2

## 2023-09-03 MED ORDER — PROPOFOL 1000 MG/100ML IV EMUL
INTRAVENOUS | Status: AC
  Filled 2023-09-03: qty 100

## 2023-09-03 MED ORDER — NITROGLYCERIN 0.4 MG SL SUBL: 0.4000 mg | SUBLINGUAL_TABLET | SUBLINGUAL | Status: DC | PRN

## 2023-09-03 MED ORDER — ACETAMINOPHEN 10 MG/ML IV SOLN
1000.0000 mg | Freq: Four times a day (QID) | INTRAVENOUS | Status: DC
  Administered 2023-09-03 – 2023-09-04 (×3): 1000 mg via INTRAVENOUS
  Filled 2023-09-03 (×3): qty 100

## 2023-09-03 MED ORDER — ALUM & MAG HYDROXIDE-SIMETH 200-200-20 MG/5ML PO SUSP: 30.0000 mL | ORAL | Status: DC | PRN

## 2023-09-03 MED ORDER — GLIPIZIDE ER 10 MG PO TB24
10.0000 mg | ORAL_TABLET | Freq: Two times a day (BID) | ORAL | Status: DC
  Administered 2023-09-03 – 2023-09-04 (×2): 10 mg via ORAL
  Filled 2023-09-03 (×2): qty 1

## 2023-09-03 MED ORDER — EZETIMIBE 10 MG PO TABS
10.0000 mg | ORAL_TABLET | ORAL | Status: DC
Start: 2023-09-04 — End: 2023-09-04
  Administered 2023-09-04: 10 mg via ORAL
  Filled 2023-09-03: qty 1

## 2023-09-03 MED ORDER — COLCHICINE 0.6 MG PO TABS: 0.6000 mg | ORAL_TABLET | Freq: Every day | ORAL | Status: DC | PRN

## 2023-09-03 MED ORDER — CELECOXIB 200 MG PO CAPS
200.0000 mg | ORAL_CAPSULE | Freq: Two times a day (BID) | ORAL | Status: DC
  Administered 2023-09-03 – 2023-09-04 (×2): 200 mg via ORAL
  Filled 2023-09-03 (×2): qty 1

## 2023-09-03 MED ORDER — PROPOFOL 10 MG/ML IV BOLUS
INTRAVENOUS | Status: DC | PRN
  Administered 2023-09-03: 20 mg via INTRAVENOUS

## 2023-09-03 MED ORDER — SODIUM CHLORIDE (PF) 0.9 % IJ SOLN
INTRAMUSCULAR | Status: AC
  Filled 2023-09-03: qty 50

## 2023-09-03 MED ORDER — INSULIN ASPART 100 UNIT/ML IJ SOLN
0.0000 [IU] | Freq: Every day | INTRAMUSCULAR | Status: DC
  Administered 2023-09-03: 3 [IU] via SUBCUTANEOUS
  Filled 2023-09-03: qty 1

## 2023-09-03 MED ORDER — INSULIN ASPART 100 UNIT/ML IJ SOLN
0.0000 [IU] | Freq: Three times a day (TID) | INTRAMUSCULAR | Status: DC
  Administered 2023-09-03 – 2023-09-04 (×2): 8 [IU] via SUBCUTANEOUS
  Administered 2023-09-04: 3 [IU] via SUBCUTANEOUS
  Filled 2023-09-03 (×3): qty 1

## 2023-09-03 MED ORDER — OXYCODONE HCL 5 MG PO TABS
10.0000 mg | ORAL_TABLET | ORAL | Status: DC | PRN
  Filled 2023-09-03: qty 2

## 2023-09-03 MED ORDER — FENTANYL CITRATE (PF) 100 MCG/2ML IJ SOLN
INTRAMUSCULAR | Status: DC | PRN
  Administered 2023-09-03 (×4): 25 ug via INTRAVENOUS

## 2023-09-03 MED ORDER — BSS IO SOLN
15.0000 mL | Freq: Once | INTRAOCULAR | Status: AC
  Administered 2023-09-03: 15 mL
  Filled 2023-09-03: qty 15

## 2023-09-03 MED ORDER — CEFAZOLIN SODIUM-DEXTROSE 2-4 GM/100ML-% IV SOLN
2.0000 g | Freq: Four times a day (QID) | INTRAVENOUS | Status: AC
  Administered 2023-09-03 (×2): 2 g via INTRAVENOUS
  Filled 2023-09-03 (×2): qty 100

## 2023-09-03 SURGICAL SUPPLY — 66 items
ATTUNE MED DOME PAT 41 KNEE (Knees) IMPLANT
ATTUNE PS FEM RT SZ 8 CEM KNEE (Femur) IMPLANT
ATTUNE PSRP INSR SZ8 5 KNEE (Insert) IMPLANT
BASE TIBIAL ROT PLAT SZ 8 KNEE (Knees) IMPLANT
BATTERY INSTRU NAVIGATION (MISCELLANEOUS) ×4 IMPLANT
BIT DRILL QUICK REL 1/8 2PK SL (BIT) ×1 IMPLANT
BLADE CLIPPER SURG (BLADE) IMPLANT
BLADE SAW 70X12.5 (BLADE) ×1 IMPLANT
BLADE SAW 90X13X1.19 OSCILLAT (BLADE) ×1 IMPLANT
BLADE SAW 90X25X1.19 OSCILLAT (BLADE) ×1 IMPLANT
BONE CEMENT GENTAMICIN (Cement) ×1 IMPLANT
BRUSH SCRUB EZ PLAIN DRY (MISCELLANEOUS) ×1 IMPLANT
CEMENT BONE GENTAMICIN 40 (Cement) IMPLANT
COOLER POLAR GLACIER W/PUMP (MISCELLANEOUS) ×1 IMPLANT
CUFF TRNQT CYL 24X4X16.5-23 (TOURNIQUET CUFF) IMPLANT
CUFF TRNQT CYL 30X4X21-28X (TOURNIQUET CUFF) IMPLANT
DRAPE SHEET LG 3/4 BI-LAMINATE (DRAPES) ×1 IMPLANT
DRSG AQUACEL AG ADV 3.5X14 (GAUZE/BANDAGES/DRESSINGS) ×1 IMPLANT
DRSG MEPILEX SACRM 8.7X9.8 (GAUZE/BANDAGES/DRESSINGS) ×1 IMPLANT
DRSG TEGADERM 4X4.75 (GAUZE/BANDAGES/DRESSINGS) ×1 IMPLANT
DURAPREP 26ML APPLICATOR (WOUND CARE) ×2 IMPLANT
ELECT CAUTERY BLADE 6.4 (BLADE) ×1 IMPLANT
ELECT REM PT RETURN 9FT ADLT (ELECTROSURGICAL) ×1 IMPLANT
ELECTRODE REM PT RTRN 9FT ADLT (ELECTROSURGICAL) ×1 IMPLANT
EVACUATOR 1/8 PVC DRAIN (DRAIN) ×1 IMPLANT
EX-PIN ORTHOLOCK NAV 4X150 (PIN) ×2 IMPLANT
GAUZE XEROFORM 1X8 LF (GAUZE/BANDAGES/DRESSINGS) ×1 IMPLANT
GLOVE BIOGEL M STRL SZ7.5 (GLOVE) ×6 IMPLANT
GLOVE SURG UNDER POLY LF SZ8 (GLOVE) ×2 IMPLANT
GOWN STRL REUS W/ TWL LRG LVL3 (GOWN DISPOSABLE) ×1 IMPLANT
GOWN STRL REUS W/ TWL XL LVL3 (GOWN DISPOSABLE) ×1 IMPLANT
GOWN TOGA ZIPPER T7+ PEEL AWAY (MISCELLANEOUS) ×1 IMPLANT
HOLDER FOLEY CATH W/STRAP (MISCELLANEOUS) ×1 IMPLANT
HOOD PEEL AWAY T7 (MISCELLANEOUS) ×1 IMPLANT
KIT TURNOVER KIT A (KITS) ×1 IMPLANT
KNIFE SCULPS 14X20 (INSTRUMENTS) ×1 IMPLANT
MANIFOLD NEPTUNE II (INSTRUMENTS) ×2 IMPLANT
NDL SPNL 20GX3.5 QUINCKE YW (NEEDLE) ×2 IMPLANT
NEEDLE SPNL 20GX3.5 QUINCKE YW (NEEDLE) ×2 IMPLANT
PACK TOTAL KNEE (MISCELLANEOUS) ×1 IMPLANT
PAD ABD DERMACEA PRESS 5X9 (GAUZE/BANDAGES/DRESSINGS) ×2 IMPLANT
PAD ARMBOARD POSITIONER FOAM (MISCELLANEOUS) ×3 IMPLANT
PAD WRAPON POLAR KNEE (MISCELLANEOUS) ×1 IMPLANT
PENCIL SMOKE EVACUATOR COATED (MISCELLANEOUS) ×1 IMPLANT
PIN DRILL FIX HALF THREAD (BIT) ×2 IMPLANT
PIN FIXATION 1/8DIA X 3INL (PIN) ×1 IMPLANT
PULSAVAC PLUS IRRIG FAN TIP (DISPOSABLE) ×1 IMPLANT
SOL .9 NS 3000ML IRR UROMATIC (IV SOLUTION) ×1 IMPLANT
SOLUTION IRRIG SURGIPHOR (IV SOLUTION) ×1 IMPLANT
SPONGE DRAIN TRACH 4X4 STRL 2S (GAUZE/BANDAGES/DRESSINGS) ×1 IMPLANT
STAPLER SKIN PROX 35W (STAPLE) ×1 IMPLANT
STOCKINETTE STRL BIAS CUT 8X4 (MISCELLANEOUS) ×1 IMPLANT
STRAP TIBIA SHORT (MISCELLANEOUS) ×1 IMPLANT
SUCTION TUBE FRAZIER 10FR DISP (SUCTIONS) ×1 IMPLANT
SUT VIC AB 0 CT1 36 (SUTURE) ×1 IMPLANT
SUT VIC AB 1 CT1 36 (SUTURE) ×2 IMPLANT
SUT VIC AB 2-0 CT2 27 (SUTURE) ×1 IMPLANT
SYR 30ML LL (SYRINGE) ×2 IMPLANT
TIBIAL BASE ROT PLAT SZ 8 KNEE (Knees) ×1 IMPLANT
TIP FAN IRRIG PULSAVAC PLUS (DISPOSABLE) ×1 IMPLANT
TOWEL OR 17X26 4PK STRL BLUE (TOWEL DISPOSABLE) ×1 IMPLANT
TOWER CARTRIDGE SMART MIX (DISPOSABLE) ×1 IMPLANT
TRAP FLUID SMOKE EVACUATOR (MISCELLANEOUS) ×1 IMPLANT
TRAY FOLEY MTR SLVR 16FR STAT (SET/KITS/TRAYS/PACK) ×1 IMPLANT
WATER STERILE IRR 1000ML POUR (IV SOLUTION) ×1 IMPLANT
WRAPON POLAR PAD KNEE (MISCELLANEOUS) ×1 IMPLANT

## 2023-09-03 NOTE — Op Note (Addendum)
 OPERATIVE NOTE  DATE OF SURGERY:  09/03/2023  PATIENT NAME:  Jerline Pain.   DOB: 1945/07/23  MRN: [id number]  PRE-OPERATIVE DIAGNOSIS: Degenerative arthrosis of the right knee, primary  POST-OPERATIVE DIAGNOSIS:  Same  PROCEDURE:  Right total knee arthroplasty using computer-assisted navigation  SURGEON:  Jena Gauss. M.D.  ANESTHESIA: spinal  ESTIMATED BLOOD LOSS: 50 mL  FLUIDS REPLACED: 800 mL of crystalloid  TOURNIQUET TIME: 109 minutes  DRAINS: 2 medium Hemovac drains  SOFT TISSUE RELEASES: Anterior cruciate ligament, posterior cruciate ligament, deep and superficial medial collateral ligament, patellofemoral ligament  IMPLANTS UTILIZED: DePuy Attune size 8 posterior stabilized femoral component (cemented), size 8 rotating platform tibial component (cemented), 41 mm medialized dome patella (cemented), and a 5 mm stabilized rotating platform polyethylene insert.  INDICATIONS FOR SURGERY: Deane Wattenbarger. is a 78 y.o. year old male with a long history of progressive knee pain. X-rays demonstrated severe degenerative changes in tricompartmental fashion. The patient had not seen any significant improvement despite conservative nonsurgical intervention. After discussion of the risks and benefits of surgical intervention, the patient expressed understanding of the risks benefits and agree with plans for total knee arthroplasty.   The risks, benefits, and alternatives were discussed at length including but not limited to the risks of infection, bleeding, nerve injury, stiffness, blood clots, the need for revision surgery, cardiopulmonary complications, among others, and they were willing to proceed.  PROCEDURE IN DETAIL: The patient was brought into the operating room and, after adequate spinal anesthesia was achieved, a tourniquet was placed on the patient's upper thigh. The patient's knee and leg were cleaned and prepped with alcohol and DuraPrep and draped in the  usual sterile fashion. A "timeout" was performed as per usual protocol. The lower extremity was exsanguinated using an Esmarch, and the tourniquet was inflated to 300 mmHg. An anterior longitudinal incision was made followed by a standard mid vastus approach. The deep fibers of the medial collateral ligament were elevated in a subperiosteal fashion off of the medial flare of the tibia so as to maintain a continuous soft tissue sleeve. The patella was subluxed laterally and the patellofemoral ligament was incised. Inspection of the knee demonstrated severe degenerative changes with full-thickness loss of articular cartilage. Osteophytes were debrided using a rongeur. Anterior and posterior cruciate ligaments were excised. Two 4.0 mm Schanz pins were inserted in the femur and into the tibia for attachment of the array of trackers used for computer-assisted navigation. Hip center was identified using a circumduction technique. Distal landmarks were mapped using the computer. The distal femur and proximal tibia were mapped using the computer. The distal femoral cutting guide was positioned using computer-assisted navigation so as to achieve a 5 distal valgus cut. The femur was sized and it was felt that a size 8 femoral component was appropriate. A size 8 femoral cutting guide was positioned and the anterior cut was performed and verified using the computer. This was followed by completion of the posterior and chamfer cuts. Femoral cutting guide for the central box was then positioned in the center box cut was performed.  Attention was then directed to the proximal tibia. Medial and lateral menisci were excised. The extramedullary tibial cutting guide was positioned using computer-assisted navigation so as to achieve a 0 varus-valgus alignment and 3 posterior slope. The cut was performed and verified using the computer. The proximal tibia was sized and it was felt that a size 8 tibial tray was appropriate. Tibial  and femoral  trials were inserted followed by insertion of a 5 mm polyethylene insert. The knee was felt to be tight medially. A Cobb elevator was used to elevate the superficial fibers of the medial collateral ligament.  This allowed for excellent mediolateral soft tissue balancing both in flexion and in full extension. Finally, the patella was cut and prepared so as to accommodate a 41 mm medialized dome patella. A patella trial was placed and the knee was placed through a range of motion with excellent patellar tracking appreciated. The femoral trial was removed after debridement of posterior osteophytes. The central post-hole for the tibial component was reamed followed by insertion of a keel punch. Tibial trials were then removed. Cut surfaces of bone were irrigated with copious amounts of normal saline using pulsatile lavage and then suctioned dry. Polymethylmethacrylate cement with gentamicin was prepared in the usual fashion using a vacuum mixer. Cement was applied to the cut surface of the proximal tibia as well as along the undersurface of a size 8 rotating platform tibial component. Tibial component was positioned and impacted into place. Excess cement was removed using Personal assistant. Cement was then applied to the cut surfaces of the femur as well as along the posterior flanges of the size 8 femoral component. The femoral component was positioned and impacted into place. Excess cement was removed using Personal assistant. A 5 mm polyethylene trial was inserted and the knee was brought into full extension with steady axial compression applied. Finally, cement was applied to the backside of a 41 mm medialized dome patella and the patellar component was positioned and patellar clamp applied. Excess cement was removed using Personal assistant. After adequate curing of the cement, the tourniquet was deflated after a total tourniquet time of 109 minutes. Hemostasis was achieved using electrocautery. The knee was  irrigated with copious amounts of normal saline using pulsatile lavage followed by 450 ml of Surgiphor and then suctioned dry. 20 mL of 1.3% Exparel and 60 mL of 0.25% Marcaine in 40 mL of normal saline was injected along the posterior capsule, medial and lateral gutters, and along the arthrotomy site. A 5 mm stabilized rotating platform polyethylene insert was inserted and the knee was placed through a range of motion with excellent mediolateral soft tissue balancing appreciated and excellent patellar tracking noted. 2 medium drains were placed in the wound bed and brought out through separate stab incisions. The medial parapatellar portion of the incision was reapproximated using interrupted sutures of #1 Vicryl. Subcutaneous tissue was approximated in layers using first #0 Vicryl followed #2-0 Vicryl. The skin was approximated with skin staples. A sterile dressing was applied.  The patient tolerated the procedure well and was transported to the recovery room in stable condition.    Rini Moffit P. Angie Fava., M.D.

## 2023-09-03 NOTE — Anesthesia Preprocedure Evaluation (Signed)
 Anesthesia Evaluation  Patient identified by MRN, date of birth, ID band Patient awake    Reviewed: Allergy & Precautions, NPO status , Patient's Chart, lab work & pertinent test results  History of Anesthesia Complications (+) PONV and history of anesthetic complications  Airway Mallampati: III       Dental  (+) Dental Advidsory Given, Edentulous Upper, Edentulous Lower   Pulmonary neg shortness of breath, neg sleep apnea, neg COPD, neg recent URI, former smoker   Pulmonary exam normal        Cardiovascular hypertension, (-) angina + CAD, + Past MI and + Cardiac Stents  (-) CABG and (-) CHF Normal cardiovascular exam(-) dysrhythmias (-) Valvular Problems/Murmurs     Neuro/Psych neg Seizures    GI/Hepatic Neg liver ROS, PUD,neg GERD  ,,  Endo/Other  diabetes, Type 2, Oral Hypoglycemic Agents    Renal/GU CRFRenal disease (CKD stage 3)     Musculoskeletal   Abdominal   Peds  Hematology   Anesthesia Other Findings Past Medical History: No date: Bilateral carotid artery stenosis No date: Cellulitis No date: Chronic pain syndrome No date: CKD (chronic kidney disease) stage 3, GFR 30-59 ml/min (HCC) No date: Complication of anesthesia     Comment:  a.) anesthesia awareness during back surgery; b.) PONV               (years ago) No date: COPD (chronic obstructive pulmonary disease) (HCC) 2002: Coronary artery disease     Comment:  a.) PCI 2002: stent (unk type) mLAD: b.) PCI 10/21/15:               95 mRCA (3.5x28 Xience Alpine DES); c.) LHC 10/07/16: MV               CAD, 15 ISR mLAD, 10 ISR mRCA; d.) PCI 06/13/19: 99 dRCA               (2.5x15 mm Res Onyx DES), 15 ISR mLAD; e.) PCI 12/20/20:               80 p-mLAD (3.0x12 Res Onyx DES), 60 p-mLCx (2.75x34 Res               Onyx), 99 m-dLCx (covered by p-mLCx DES); f.) LHC               03/09/2023: 50 pRCA, 50 dRCA, 30 p-mLCx, 50 OM1, 40 mLAD               - med  mgmt No date: DDD (degenerative disc disease), lumbar No date: Diabetic peripheral neuropathy (HCC) No date: DM (diabetes mellitus), type 2 (HCC) No date: DOE (dyspnea on exertion) No date: Duodenal ulcer 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 No date: Family history of adverse reaction to anesthesia     Comment:  a.) father with (+) delayed/prolonged emergence No date: Former smoker No date: GERD (gastroesophageal reflux disease) No date: Gout No date: H/O unstable angina No date: HFrEF (heart failure with reduced ejection fraction) (HCC) 11/2020: History of COVID-19 No date: History of heart artery stent     Comment:  a.) TOTAL OF 5 stents as of 09/01/2023 No date: Hyperlipidemia No date: Hypertension No date: Ischemic cardiomyopathy     Comment:  a.) MV 10/17/15: EF 48%; b.) LHC 10/21/15: EF 50%;  c.)               stress TTE 09/29/16: EF 50%; d.) LHC 10/07/16: EF 45%; e.)  MV 09/06/2018: EF 42%;  f.) LHC 06/13/19: EF 40%;  g.) TTE              01/29/20: EF 40%; h.) TTE 07/22/20: EF 40%; i.) MV 07/22/20:               EF 49%;  j.) LHC 12/20/20: EF 45-50%;  k.) TTE 10/01/21: EF               40%;  l.) MV 10/01/21: EF 39%;  m.) MV 01/05/23: EF 45-50%;              n.) TTE 01/05/23: EF >55%; o.) LHC 03/09/23: EF 35-45% No date: Long term current use of aspirin 06/12/2019: NSTEMI (non-ST elevated myocardial infarction) (HCC)     Comment:  a.) troponins were trended: 169 --> 1,013 --> 5,254 -->               8,718 ng/L; b.) LHC/PCI 06/13/2019: 15% ISR mLAD, 90% D1,              25% o-pLCx, 60% m-dLCx, 70% p-mLCx, 10% ISR mRCA, 99%               dRCA (2.5 x 15 mm Resolute Onyx DES) 12/20/2020: NSTEMI (non-ST elevated myocardial infarction) (HCC)     Comment:  a.) troponins were trended: 141 -- 4,261 -- >24,000 --               >24,000 -- 16,649 ng/L; b.) PCI 12/20/2020: 20% m-dLM,               30% oLAD, 10% ISR mLAD, 30% m-dLAD, 60% p-mLCx (2.75 x 24              mm Resolute Onyx DES),  99% m-dLCx (covered by p-mLCx               DES), 80% p-mLAD (3.0 x 12 mm Resolute Onyx DES), 50%               pRCA, 10% ISR p-mRCA No date: Osteoarthritis No date: Palpitations (PACs/PVCs) No date: Perforated intestine (HCC) No date: PONV (postoperative nausea and vomiting) No date: Rotator cuff tendonitis No date: Sleep apnea     Comment:  a.) no nocturnal PAP therapy No date: Statin intolerance   Reproductive/Obstetrics                             Anesthesia Physical Anesthesia Plan  ASA: III  Anesthesia Plan: Spinal   Post-op Pain Management:    Induction: Intravenous  PONV Risk Score and Plan: 3 and Propofol infusion, TIVA and Treatment may vary due to age or medical condition  Airway Management Planned: Natural Airway and Simple Face Mask  Additional Equipment:   Intra-op Plan:   Post-operative Plan:   Informed Consent: I have reviewed the patients History and Physical, chart, labs and discussed the procedure including the risks, benefits and alternatives for the proposed anesthesia with the patient or authorized representative who has indicated his/her understanding and acceptance.       Plan Discussed with:   Anesthesia Plan Comments:         Anesthesia Quick Evaluation

## 2023-09-03 NOTE — Discharge Summary (Signed)
 Physician Discharge Summary  Subjective: 1 Day Post-Op Procedure(s) (LRB): ARTHROPLASTY, KNEE, TOTAL, USING IMAGELESS COMPUTER-ASSISTED NAVIGATION (Right) Patient reports pain as mild.   Patient seen in rounds with Dr. Ernest Pine. Patient is well, and has had no acute complaints or problems. Denies any CP, SOB, N/V, fevers or chills We will continue with therapy today.  Patient is ready to go home  Physician Discharge Summary  Patient ID: Aaron [name]. MRN: [id number] DOB/AGE: 11-09-1945 78 y.o.  Admit date: 09/03/2023 Discharge date: 09/04/2023  Admission Diagnoses:  Discharge Diagnoses:  Principal Problem:   History of total knee arthroplasty, right   Discharged Condition: good  Hospital Course: Patient presented to the hospital on 09/03/2023 for an elective right total knee arthroplasty performed by Dr. Ernest Pine. Patient was given 1g of TXA and 2g of Ancef prior to the procedure. he  tolerated the procedure well without any complications. See procedural note below for details. Postoperatively, the patient did very well. he  was able to pass PT protocols on post-op day one without any issues. JP drain was removed without any difficulty and was intact. he  was able to void his bladder without any difficulty. Physical exam was unremarkable.  He did report having a "charley horse" feeling in his left chest the night of surgery.  EKG was taken which was negative for any acute cardiac event.  During his postop day 1 visit, he  denies any SOB, CP, N/V, fevers or chills.  Patient also did report having increased tenderness and watering to his right eye.  Was found to have a small corneal abrasion.  Patient was sent home with antibiotic eyedrops and Auclar to help with the underlying corneal abrasion.  Vital signs are stable. Patient is stable to discharge home.  PROCEDURE:  Right total knee arthroplasty using computer-assisted navigation   SURGEON:  Jena Gauss. M.D.   ANESTHESIA:  spinal   ESTIMATED BLOOD LOSS: 50 mL   FLUIDS REPLACED: 800 mL of crystalloid   TOURNIQUET TIME: 109 minutes   DRAINS: 2 medium Hemovac drains   SOFT TISSUE RELEASES: Anterior cruciate ligament, posterior cruciate ligament, deep and superficial medial collateral ligament, patellofemoral ligament   IMPLANTS UTILIZED: DePuy Attune size 8 posterior stabilized femoral component (cemented), size 8 rotating platform tibial component (cemented), 41 mm medialized dome patella (cemented), and a 5 mm stabilized rotating platform polyethylene insert.   Treatments: none  Discharge Exam: Blood pressure (!) 104/53, pulse 65, temperature 98.4 F (36.9 C), resp. rate 18, SpO2 97%.   Disposition: home   Allergies as of 09/04/2023       Reactions   Ace Inhibitors Other (See Comments)   weakness   Isosorbide    weakness   Losartan    weakness   Metoprolol Tartrate Other (See Comments)   weakness   Statins Nausea And Vomiting, Other (See Comments)   Patient states he gets real weak and headache.   Sulfa Antibiotics Other (See Comments)   Unknown childhood reaction         Medication List     STOP taking these medications    indomethacin 50 MG capsule Commonly known as: INDOCIN       TAKE these medications    amLODipine 5 MG tablet Commonly known as: NORVASC Take 2 tablets (10 mg total) by mouth daily. What changed:  how much to take when to take this   aspirin 81 MG chewable tablet Chew 1 tablet (81 mg total) by mouth 2 (two)  times daily. What changed: when to take this   augmented betamethasone dipropionate 0.05 % cream Commonly known as: DIPROLENE-AF Apply 1 application  topically 2 (two) times daily as needed (Rash behind ear).   carvedilol 3.125 MG tablet Commonly known as: COREG Take 2 tablets (6.25 mg total) by mouth 2 (two) times daily with a meal. What changed: how much to take   celecoxib 200 MG capsule Commonly known as: CELEBREX Take 1 capsule (200  mg total) by mouth 2 (two) times daily.   ciprofloxacin 0.3 % ophthalmic solution Commonly known as: Ciloxan Place 2 drops into both eyes every 4 (four) hours while awake. Administer 1 drop, every 2 hours, while awake, for 2 days. Then 1 drop, every 4 hours, while awake, for the next 5 days.   CO Q-10 PO Take 200 mg by mouth daily. PT takes with Co Q-10 400 mg   CO-Q 10 OMEGA-3 FISH OIL PO Take 400 mg by mouth daily. Pt takes with Co Q-10 200 mg   colchicine 0.6 MG tablet Take 0.6 mg by mouth daily as needed (Gout).   cyanocobalamin 1000 MCG tablet Commonly known as: VITAMIN B12 Take 1,000 mcg by mouth daily.   ezetimibe 10 MG tablet Commonly known as: ZETIA Take 1 tablet (10 mg total) by mouth daily. What changed: when to take this   FLAXSEED OIL PO Take 1 tablet by mouth daily at 6 (six) AM.   FreeStyle Libre 3 Reader Devi 1 (ONE) EACH FOR CONTINUOUS GLUCOSE MONITORING   FreeStyle Libre 3 Sensor Misc 1 (ONE) KIT APPLIED EVERY 14 DAYS FOR GLUCOSE MONITORING   glipiZIDE 10 MG 24 hr tablet Commonly known as: GLUCOTROL XL Take 10 mg by mouth 2 (two) times daily.   ketoconazole 2 % shampoo Commonly known as: NIZORAL Apply 1 Application topically daily as needed for irritation.   MANGANESE PO Take 1 tablet by mouth daily at 6 (six) AM.   metFORMIN 500 MG 24 hr tablet Commonly known as: GLUCOPHAGE-XR Take 1,000 mg by mouth 2 (two) times daily with a meal.   nitroGLYCERIN 0.4 MG SL tablet Commonly known as: NITROSTAT Take 0.4 mg by mouth every 5 (five) minutes as needed for chest pain.   OMEGA-3 FISH OIL PO Take 2,000 mg by mouth daily.   OVER THE COUNTER MEDICATION Take 2 capsules by mouth daily. Vision Shield Adult [employer]  with Lutein   oxyCODONE 5 MG immediate release tablet Commonly known as: Oxy IR/ROXICODONE Take 1 tablet (5 mg total) by mouth every 4 (four) hours as needed for moderate pain (pain score 4-6) (pain score 4-6).   Repatha SureClick 140  MG/ML Soaj Generic drug: Evolocumab Inject 140 mg into the skin every 14 (fourteen) days.   traMADol 50 MG tablet Commonly known as: ULTRAM Take 2 tablets (100 mg total) by mouth daily as needed for moderate pain (pain score 4-6) or severe pain (pain score 7-10).   Vascepa 1 g capsule Generic drug: icosapent Ethyl Take 2 g by mouth 2 (two) times daily.   Vitamin K2 100 MCG Caps Take 1 capsule by mouth daily.   Zinc 100 MG Tabs Take 1 tablet by mouth daily at 6 (six) AM.               Durable Medical Equipment  (From admission, onward)           Start     Ordered   09/03/23 1436  DME Walker rolling  Once  Question:  Patient needs a walker to treat with the following condition  Answer:  Total knee replacement status   09/03/23 1435   09/03/23 1436  DME Bedside commode  Once       Comments: Patient is not able to walk the distance required to go the bathroom, or he/she is unable to safely negotiate stairs required to access the bathroom.  A 3in1 BSC will alleviate this problem  Question:  Patient needs a bedside commode to treat with the following condition  Answer:  Total knee replacement status   09/03/23 1435            Follow-up Information     Rayburn Go, [name] Follow up on 09/17/2023.   Specialty: Orthopedic Surgery Why: at 10:30am Contact information: [address] [place] [address] [phone]         Donato Heinz, MD Follow up on 10/14/2023.   Specialty: Orthopedic Surgery Why: at 9:30am Contact information: [address] [facility] [place] [address] [phone]                 Signed: Gean Birchwood 09/04/2023, 9:46 AM   Objective: Vital signs in last 24 hours: Temp:  [97.5 F (36.4 C)-98.4 F (36.9 C)] 98.4 F (36.9 C) (03/15 0803) Pulse Rate:  [65-87] 65 (03/15 0803) Resp:  [13-18] 18 (03/15 0803) BP: (104-139)/(53-71) 104/53 (03/15 0803) SpO2:  [93 %-97 %] 97 % (03/15  0803)  Intake/Output from previous day:  Intake/Output Summary (Last 24 hours) at 09/04/2023 0946 Last data filed at 09/04/2023 0529 Gross per 24 hour  Intake 1380 ml  Output 1665 ml  Net -285 ml    Intake/Output this shift: No intake/output data recorded.  Labs: No results for input(s): "HGB" in the last 72 hours. No results for input(s): "WBC", "RBC", "HCT", "PLT" in the last 72 hours. No results for input(s): "NA", "K", "CL", "CO2", "BUN", "CREATININE", "GLUCOSE", "CALCIUM" in the last 72 hours. No results for input(s): "LABPT", "INR" in the last 72 hours.  EXAM: General - Patient is Alert, Appropriate, and Oriented Extremity - Neurologically intact Neurovascular intact Sensation intact distally Intact pulses distally Dorsiflexion/Plantar flexion intact No cellulitis present Compartment soft Dressing - dressing C/D/I and no drainage Motor Function - intact, moving foot and toes well on exam.  JP Drain pulled without difficulty. Intact  Assessment/Plan: 1 Day Post-Op Procedure(s) (LRB): ARTHROPLASTY, KNEE, TOTAL, USING IMAGELESS COMPUTER-ASSISTED NAVIGATION (Right) Procedure(s) (LRB): ARTHROPLASTY, KNEE, TOTAL, USING IMAGELESS COMPUTER-ASSISTED NAVIGATION (Right) Past Medical History:  Diagnosis Date   Bilateral carotid artery stenosis    Cellulitis    Chronic pain syndrome    CKD (chronic kidney disease) stage 3, GFR 30-59 ml/min (HCC)    Complication of anesthesia    a.) anesthesia awareness during back surgery; b.) PONV (years ago)   COPD (chronic obstructive pulmonary disease) (HCC)    Coronary artery disease 2002   a.) PCI 2002: stent (unk type) mLAD: b.) PCI 10/21/15: 95 mRCA (3.5x28 Xience Alpine DES); c.) LHC 10/07/16: MV CAD, 15 ISR mLAD, 10 ISR mRCA; d.) PCI 06/13/19: 99 dRCA (2.5x15 mm Res Onyx DES), 15 ISR mLAD; e.) PCI 12/20/20: 80 p-mLAD (3.0x12 Res Onyx DES), 60 p-mLCx (2.75x34 Res Onyx), 99 m-dLCx (covered by p-mLCx DES); f.) LHC 03/09/2023: 50 pRCA, 50  dRCA, 30 p-mLCx, 50 OM1, 40 mLAD - med mgmt   DDD (degenerative disc disease), lumbar    Diabetic peripheral neuropathy (HCC)    DM (diabetes mellitus), type  2 (HCC)    DOE (dyspnea on exertion)    Duodenal ulcer    Failed back surgical syndrome    Family history of adverse reaction to anesthesia    a.) father with (+) delayed/prolonged emergence   Former smoker    GERD (gastroesophageal reflux disease)    Gout    H/O unstable angina    HFrEF (heart failure with reduced ejection fraction) (HCC)    History of COVID-19 11/2020   History of heart artery stent    a.) TOTAL OF 5 stents as of 09/01/2023   Hyperlipidemia    Hypertension    Ischemic cardiomyopathy    a.) MV 10/17/15: EF 48%; b.) LHC 10/21/15: EF 50%;  c.) stress TTE 09/29/16: EF 50%; d.) LHC 10/07/16: EF 45%; e.) MV 09/06/2018: EF 42%;  f.) LHC 06/13/19: EF 40%;  g.) TTE 01/29/20: EF 40%; h.) TTE 07/22/20: EF 40%; i.) MV 07/22/20: EF 49%;  j.) LHC 12/20/20: EF 45-50%;  k.) TTE 10/01/21: EF 40%;  l.) MV 10/01/21: EF 39%;  m.) MV 01/05/23: EF 45-50%;  n.) TTE 01/05/23: EF >55%; o.) LHC 03/09/23: EF 35-45%   Long term current use of aspirin    NSTEMI (non-ST elevated myocardial infarction) (HCC) 06/12/2019   a.) troponins were trended: 169 --> 1,013 --> 5,254 --> 8,718 ng/L; b.) LHC/PCI 06/13/2019: 15% ISR mLAD, 90% D1, 25% o-pLCx, 60% m-dLCx, 70% p-mLCx, 10% ISR mRCA, 99% dRCA (2.5 x 15 mm Resolute Onyx DES)   NSTEMI (non-ST elevated myocardial infarction) (HCC) 12/20/2020   a.) troponins were trended: 141 -- 4,261 -- >24,000 -- >24,000 -- 16,649 ng/L; b.) PCI 12/20/2020: 20% m-dLM, 30% oLAD, 10% ISR mLAD, 30% m-dLAD, 60% p-mLCx (2.75 x 24 mm Resolute Onyx DES), 99% m-dLCx (covered by p-mLCx DES), 80% p-mLAD (3.0 x 12 mm Resolute Onyx DES), 50% pRCA, 10% ISR p-mRCA   Osteoarthritis    Palpitations (PACs/PVCs)    Perforated intestine (HCC)    PONV (postoperative nausea and vomiting)    Rotator cuff tendonitis    Sleep apnea    a.) no nocturnal  PAP therapy   Statin intolerance    Principal Problem:   History of total knee arthroplasty, right  Estimated body mass index is 31.7 kg/m as calculated from the following:   Height as of 08/27/23: 6\' 1"  (1.854 m).   Weight as of 08/27/23: 109 kg.  Patient will continue to work with physical therapy on gait, ROM and strengthening  Patient will take ciprofloxacin 0.3% ophthalmic solution every 4 hours at home for antibiotic coverage after corneal abrasion.  May also use Auclair up to 3 times a day as needed for eye discomfort.   Discussed with the patient continuing to utilize Polar Care   Patient will use bone foam in 20-30 minute intervals   Patient will wear TED hose bilaterally to help prevent DVT and clot formation   Discussed the Aquacel bandage.  This bandage will stay in place 7 days postoperatively.  Can be replaced with honeycomb bandages that will be sent home with the patient   Discussed sending the patient home with tramadol and oxycodone for as needed pain management.  Patient will also be sent home with Celebrex to help with swelling and inflammation.  Patient will take an 81 mg aspirin twice daily for DVT prophylaxis   JP drain removed without difficulty, intact   Weight-Bearing as tolerated to right leg   Patient will follow-up with [facility] orthopedics in 2 weeks for  staple removal and reevaluation  Diet - Diabetic diet Follow up - in 2 weeks Activity - WBAT Disposition - Home Condition Upon Discharge - Good DVT Prophylaxis - Aspirin and TED hose  Danise Edge, [name] Orthopaedic Surgery 09/04/2023, 9:46 AM

## 2023-09-03 NOTE — Progress Notes (Signed)
 PT Cancellation Note  Patient Details Name: Aaron [name]. MRN: [id number] DOB: [birth year]   Cancelled Treatment:    Reason Eval/Treat Not Completed: Other (comment). Orders received and chart reviewed. Upon entry to room pt still having limitations in safe participation with PT eval due to lingering effects of spinal anesthesia. Pt unable to mobilize ankle/knee and has no sensation to light touch. Deferring evaluation to the morning for safe OOB participation.    Delphia Grates. Fairly IV, PT, DPT Physical Therapist- [place]  [facility]  09/03/2023, 3:29 PM

## 2023-09-03 NOTE — Transfer of Care (Signed)
 Immediate Anesthesia Transfer of Care Note  Patient: Aaron [name].  Procedure(s) Performed: ARTHROPLASTY, KNEE, TOTAL, USING IMAGELESS COMPUTER-ASSISTED NAVIGATION (Right: Knee)  Patient Location: PACU  Anesthesia Type:Spinal  Level of Consciousness: awake and alert   Airway & Oxygen Therapy: Patient Spontanous Breathing and Patient connected to face mask oxygen  Post-op Assessment: Report given to RN and Post -op Vital signs reviewed and stable  Post vital signs: Reviewed and stable  Last Vitals:  Vitals Value Taken Time  BP 137/71 09/03/23 1148  Temp 36.4 C 09/03/23 1148  Pulse 84 09/03/23 1152  Resp 17 09/03/23 1152  SpO2 93 % 09/03/23 1152  Vitals shown include unfiled device data.  Last Pain:  Vitals:   09/03/23 8119  TempSrc: Tympanic  PainSc: 0-No pain         Complications: No notable events documented.

## 2023-09-03 NOTE — Progress Notes (Signed)
 About to take patient to inpatient room, and patient notified this RN that he has a "charlie horse" in his left chest. Notified Dr. Karlton Lemon and EKG ordered to r/o. Per Dr. Karlton Lemon patient is good EKG NSR.

## 2023-09-03 NOTE — Progress Notes (Signed)
 Patient c/o pain in right eye, assessed eye and it is painful to open and redness at the sclera noted. Forcefully closing eye due to pain. Irrigated eye with saline and no improvement with discomfort. Pt states "my eye feels dry and irritated" Notified Dr. Karlton Lemon anesthesia and corneal abrasion order set implemented with BSS irrigation and Toradol drops. Awaiting irrigation solution and Toradol from main pharmacy. Otherwise, patient is doing very well and resting comfortably. VSS.

## 2023-09-03 NOTE — Interval H&P Note (Signed)
 History and Physical Interval Note:  09/03/2023 6:09 AM  Aaron [name].  has presented today for surgery, with the diagnosis of Primary osteoarthritis of right knee M17.11.  The various methods of treatment have been discussed with the patient and family. After consideration of risks, benefits and other options for treatment, the patient has consented to  Procedure(s): ARTHROPLASTY, KNEE, TOTAL, USING IMAGELESS COMPUTER-ASSISTED NAVIGATION (Right) as a surgical intervention.  The patient's history has been reviewed, patient examined, no change in status, stable for surgery.  I have reviewed the patient's chart and labs.  Questions were answered to the patient's satisfaction.     Irfan Veal P Joelys Staubs

## 2023-09-03 NOTE — Anesthesia Procedure Notes (Signed)
 Spinal  Patient location during procedure: OR Start time: 09/03/2023 7:34 AM End time: 09/03/2023 7:36 AM Reason for block: surgical anesthesia Staffing Performed: resident/CRNA  Resident/CRNA: Monico Hoar, CRNA Performed by: Monico Hoar, CRNA Authorized by: Lenard Simmer, MD   Preanesthetic Checklist Completed: patient identified, IV checked, site marked, risks and benefits discussed, surgical consent, monitors and equipment checked, pre-op evaluation and timeout performed Spinal Block Patient position: sitting Prep: ChloraPrep Patient monitoring: heart rate, continuous pulse ox, blood pressure and cardiac monitor Approach: right paramedian Location: L4-5 Injection technique: single-shot Needle Needle type: Whitacre  Needle gauge: 22 G Needle length: 9 cm Assessment Sensory level: T6 Events: CSF return Additional Notes Negative paresthesia. Negative blood return. Positive free-flowing CSF. Expiration date of kit checked and confirmed. Patient tolerated procedure well, without complications.

## 2023-09-03 NOTE — Progress Notes (Addendum)
 Subjective: 1 Day Post-Op Procedure(s) (LRB): ARTHROPLASTY, KNEE, TOTAL, USING IMAGELESS COMPUTER-ASSISTED NAVIGATION (Right) Patient reports pain as mild.   Patient seen in rounds with Dr. Joice Lofts. Patient is well, and has had no acute complaints or problems.  Patient did have 1 bout of chest pressure last night.  An EKG was performed which was reviewed by cardiology, without any issue.  He denies any CP, SOB, N/V, fevers or chills this morning. Patient was also evaluated for a small corneal abrasion yesterday, states that his eyes still somewhat bothersome today, but somewhat improved. We will continue with therapy today.  Plan is to go Home after hospital stay.  Objective: Vital signs in last 24 hours: Temp:  [97.5 F (36.4 C)-98.4 F (36.9 C)] 98.4 F (36.9 C) (03/15 0803) Pulse Rate:  [65-87] 65 (03/15 0803) Resp:  [13-18] 18 (03/15 0803) BP: (104-139)/(53-71) 104/53 (03/15 0803) SpO2:  [93 %-97 %] 97 % (03/15 0803)  Intake/Output from previous day:  Intake/Output Summary (Last 24 hours) at 09/04/2023 0944 Last data filed at 09/04/2023 0529 Gross per 24 hour  Intake 1380 ml  Output 1665 ml  Net -285 ml    Intake/Output this shift: No intake/output data recorded.  Labs: No results for input(s): "HGB" in the last 72 hours. No results for input(s): "WBC", "RBC", "HCT", "PLT" in the last 72 hours. No results for input(s): "NA", "K", "CL", "CO2", "BUN", "CREATININE", "GLUCOSE", "CALCIUM" in the last 72 hours. No results for input(s): "LABPT", "INR" in the last 72 hours.  EXAM General - Patient is Alert, Appropriate, and Oriented Extremity - Neurologically intact Neurovascular intact Sensation intact distally Intact pulses distally Dorsiflexion/Plantar flexion intact No cellulitis present Compartment soft Dressing - dressing C/D/I and no drainage Motor Function - intact, moving foot and toes well on exam.  JP Drain pulled without difficulty. Intact  Past Medical  History:  Diagnosis Date   Bilateral carotid artery stenosis    Cellulitis    Chronic pain syndrome    CKD (chronic kidney disease) stage 3, GFR 30-59 ml/min (HCC)    Complication of anesthesia    a.) anesthesia awareness during back surgery; b.) PONV (years ago)   COPD (chronic obstructive pulmonary disease) (HCC)    Coronary artery disease 2002   a.) PCI 2002: stent (unk type) mLAD: b.) PCI 10/21/15: 95 mRCA (3.5x28 Xience Alpine DES); c.) LHC 10/07/16: MV CAD, 15 ISR mLAD, 10 ISR mRCA; d.) PCI 06/13/19: 99 dRCA (2.5x15 mm Res Onyx DES), 15 ISR mLAD; e.) PCI 12/20/20: 80 p-mLAD (3.0x12 Res Onyx DES), 60 p-mLCx (2.75x34 Res Onyx), 99 m-dLCx (covered by p-mLCx DES); f.) LHC 03/09/2023: 50 pRCA, 50 dRCA, 30 p-mLCx, 50 OM1, 40 mLAD - med mgmt   DDD (degenerative disc disease), lumbar    Diabetic peripheral neuropathy (HCC)    DM (diabetes mellitus), type 2 (HCC)    DOE (dyspnea on exertion)    Duodenal ulcer    Failed back surgical syndrome    Family history of adverse reaction to anesthesia    a.) father with (+) delayed/prolonged emergence   Former smoker    GERD (gastroesophageal reflux disease)    Gout    H/O unstable angina    HFrEF (heart failure with reduced ejection fraction) (HCC)    History of COVID-19 11/2020   History of heart artery stent    a.) TOTAL OF 5 stents as of 09/01/2023   Hyperlipidemia    Hypertension    Ischemic cardiomyopathy    a.) MV 10/17/15:  EF 48%; b.) LHC 10/21/15: EF 50%;  c.) stress TTE 09/29/16: EF 50%; d.) LHC 10/07/16: EF 45%; e.) MV 09/06/2018: EF 42%;  f.) LHC 06/13/19: EF 40%;  g.) TTE 01/29/20: EF 40%; h.) TTE 07/22/20: EF 40%; i.) MV 07/22/20: EF 49%;  j.) LHC 12/20/20: EF 45-50%;  k.) TTE 10/01/21: EF 40%;  l.) MV 10/01/21: EF 39%;  m.) MV 01/05/23: EF 45-50%;  n.) TTE 01/05/23: EF >55%; o.) LHC 03/09/23: EF 35-45%   Long term current use of aspirin    NSTEMI (non-ST elevated myocardial infarction) (HCC) 06/12/2019   a.) troponins were trended: 169 --> 1,013  --> 5,254 --> 8,718 ng/L; b.) LHC/PCI 06/13/2019: 15% ISR mLAD, 90% D1, 25% o-pLCx, 60% m-dLCx, 70% p-mLCx, 10% ISR mRCA, 99% dRCA (2.5 x 15 mm Resolute Onyx DES)   NSTEMI (non-ST elevated myocardial infarction) (HCC) 12/20/2020   a.) troponins were trended: 141 -- 4,261 -- >24,000 -- >24,000 -- 16,649 ng/L; b.) PCI 12/20/2020: 20% m-dLM, 30% oLAD, 10% ISR mLAD, 30% m-dLAD, 60% p-mLCx (2.75 x 24 mm Resolute Onyx DES), 99% m-dLCx (covered by p-mLCx DES), 80% p-mLAD (3.0 x 12 mm Resolute Onyx DES), 50% pRCA, 10% ISR p-mRCA   Osteoarthritis    Palpitations (PACs/PVCs)    Perforated intestine (HCC)    PONV (postoperative nausea and vomiting)    Rotator cuff tendonitis    Sleep apnea    a.) no nocturnal PAP therapy   Statin intolerance     Assessment/Plan: 1 Day Post-Op Procedure(s) (LRB): ARTHROPLASTY, KNEE, TOTAL, USING IMAGELESS COMPUTER-ASSISTED NAVIGATION (Right) Principal Problem:   History of total knee arthroplasty, right  Estimated body mass index is 31.7 kg/m as calculated from the following:   Height as of 08/27/23: 6\' 1"  (1.854 m).   Weight as of 08/27/23: 109 kg. Advance diet Up with therapy  Patient will continue to work with physical therapy to pass postoperative PT protocols, ROM and strengthening  Patient will take ciprofloxacin 0.3% ophthalmic solution every 4 hours at home for antibiotic coverage after corneal abrasion  Discussed with the patient continuing to utilize Polar Care  Patient will use bone foam in 20-30 minute intervals  Patient will wear TED hose bilaterally to help prevent DVT and clot formation  Discussed the Aquacel bandage.  This bandage will stay in place 7 days postoperatively.  Can be replaced with honeycomb bandages that will be sent home with the patient  Discussed sending the patient home with tramadol and oxycodone for as needed pain management.  Patient will also be sent home with Celebrex to help with swelling and inflammation.  Patient will  take an 81 mg aspirin twice daily for DVT prophylaxis  JP drain removed without difficulty, intact  Weight-Bearing as tolerated to right leg  Patient will follow-up with [facility] clinic orthopedics in 2 weeks for staple removal and reevaluation  Rayburn Go, [name] [facility]opa[facility] 09/04/2023, 9:44 AM

## 2023-09-03 NOTE — Progress Notes (Signed)
 Patient is not able to walk the distance required to go the bathroom, or he/she is unable to safely negotiate stairs required to access the bathroom.  A 3in1 BSC will alleviate this problem   Amenda Duclos P. Angie Fava M.D.

## 2023-09-04 DIAGNOSIS — M1711 Unilateral primary osteoarthritis, right knee: Secondary | ICD-10-CM | POA: Diagnosis not present

## 2023-09-04 LAB — GLUCOSE, CAPILLARY
Glucose-Capillary: 180 mg/dL — ABNORMAL HIGH (ref 70–99)
Glucose-Capillary: 293 mg/dL — ABNORMAL HIGH (ref 70–99)

## 2023-09-04 MED ORDER — KETOROLAC TROMETHAMINE 0.5 % OP SOLN: 1.0000 [drp] | Freq: Three times a day (TID) | OPHTHALMIC | 0 refills | Status: AC

## 2023-09-04 MED ORDER — CELECOXIB 200 MG PO CAPS: 200.0000 mg | ORAL_CAPSULE | Freq: Two times a day (BID) | ORAL | 1 refills | Status: AC

## 2023-09-04 MED ORDER — ASPIRIN 81 MG PO CHEW: 81.0000 mg | CHEWABLE_TABLET | Freq: Two times a day (BID) | ORAL | Status: AC

## 2023-09-04 MED ORDER — CIPROFLOXACIN HCL 0.3 % OP SOLN: 2.0000 [drp] | OPHTHALMIC | 0 refills | Status: AC

## 2023-09-04 MED ORDER — OXYCODONE HCL 5 MG PO TABS
5.0000 mg | ORAL_TABLET | ORAL | 0 refills | Status: AC | PRN
Start: 2023-09-04 — End: ?

## 2023-09-04 MED ORDER — ACETAMINOPHEN 500 MG PO TABS
1000.0000 mg | ORAL_TABLET | Freq: Once | ORAL | Status: AC
  Administered 2023-09-04: 1000 mg via ORAL
  Filled 2023-09-04: qty 2

## 2023-09-04 MED ORDER — TRAMADOL HCL 50 MG PO TABS: 100.0000 mg | ORAL_TABLET | Freq: Every day | ORAL | 0 refills | Status: DC | PRN

## 2023-09-04 NOTE — TOC Transition Note (Signed)
 Transition of Care [facility]) - Discharge Note   Patient Details  Name: Aaron [name]. MRN: [id number] Date of Birth: 05/21/1946  Transition of Care [facility]) CM/SW Contact:  Bing Quarry, RN Phone Number: 09/04/2023, 10:02 AM   Clinical Narrative: 3/15: Discharge orders in for today to home with [facility] via [facility] prearranged prior to admission.DME RW and BSC ware not yet ordered. Spoke with patient, does not have needed DME. No agency preference, contacted [facility] via [facility]. Delivery to room will be later in the day, per [employer], although discharge order is in. Updated patient. Daughter will transport home on discharge.    Gabriel Cirri MSN RN CM  RN Case Manager [place]  Transitions of Care Direct Dial: [phone] (Weekends Only) [facility] Main Office Phone: [phone] [facility] Fax: [phone] [email or web address]        Final next level of care: Home w Home Health Services Barriers to Discharge: No Barriers Identified   Patient Goals and CMS Choice     Choice offered to / list presented to : Patient      Discharge Placement                       Discharge Plan and Services Additional resources added to the After Visit Summary for                  DME Arranged: Walker rolling, 3-N-1 DME Agency: [facility] Date DME Agency Contacted: 09/04/23   Representative spoke with at DME Agency: [facility] HH Arranged: PT, OT HH Agency: [facility] Date [facility] Agency Contacted:  (Arranged PTA by surgeon's office via [facility].)      Social Drivers of Health (SDOH) Interventions SDOH Screenings   Food Insecurity: No Food Insecurity (09/03/2023)  Housing: Low Risk  (09/03/2023)  Transportation Needs: No Transportation Needs (09/03/2023)  Utilities: Not At Risk (09/03/2023)  Depression (PHQ2-9): Low Risk  (06/03/2022)  Financial Resource Strain: Low Risk  (05/06/2020)   Received from [facility], [facility]  Physical Activity: Insufficiently Active (05/06/2020)    Received from [facility], [facility]  Social Connections: Moderately Isolated (05/06/2020)   Received from [facility], [facility]  Stress: No Stress Concern Present (05/06/2020)   Received from [facility], [facility]  Tobacco Use: Medium Risk (09/03/2023)  Health Literacy: Low Risk  (05/06/2020)   Received from [facility], [facility]     Readmission Risk Interventions     No data to display

## 2023-09-04 NOTE — Care Management Obs Status (Signed)
 MEDICARE OBSERVATION STATUS NOTIFICATION   Patient Details  Name: Aaron [name]. MRN: [id number] Date of Birth: 26-May-1946   Medicare Observation Status Notification Given:  Yes    Bing Quarry, RN 09/04/2023, 2:23 PM

## 2023-09-04 NOTE — Evaluation (Signed)
 Occupational Therapy Evaluation Patient Details Name: Aaron [name]. MRN: [id number] DOB: Mar 23, 1946 Today's Date: 09/04/2023   History of Present Illness   Patient is s/p R TKA. History of L TKA.     Clinical Impressions Pt in bed upon OT arrival and ready for OT/PT co-eval.  Pt presents with functional decline in ADLs and mobility s/p R TKA.  Overall performing LB ADLs at bedside with mod A d/t limited reach to feet, but performing functional transfers and mobility with min guard and RW, min vc for walker safety.  Pt resides independently in mobile home at baseline, but planning to d/c home to daughter's home.  Pt will benefit skilled OT in the acute setting and HH OT upon d/c in order to maximize safety and indep with daily tasks in order to reduce burden of care on family, while working to return to PLOF.        If plan is discharge home, recommend the following:   A little help with walking and/or transfers;A little help with bathing/dressing/bathroom     Functional Status Assessment   Patient has had a recent decline in their functional status and demonstrates the ability to make significant improvements in function in a reasonable and predictable amount of time.     Equipment Recommendations   BSC/3in1;Other (comment) (RW)     Recommendations for Other Services         Precautions/Restrictions   Precautions Precautions: Fall Recall of Precautions/Restrictions: Intact Restrictions Weight Bearing Restrictions Per Provider Order: Yes RLE Weight Bearing Per Provider Order: Weight bearing as tolerated     Mobility Bed Mobility Overal bed mobility: Modified Independent             General bed mobility comments: increased time, effort, use of bed rails. No physical assist needed Patient Response: Cooperative  Transfers Overall transfer level: Needs assistance Equipment used: Rolling walker (2 wheels) Transfers: Sit to/from Stand Sit to Stand:  From elevated surface, Contact guard assist           General transfer comment: Cues for safe hand placement and keeping feet within back 2 legs of walker for optimal standing support and posture      Balance Overall balance assessment: Needs assistance Sitting-balance support: Feet supported Sitting balance-Leahy Scale: Good Sitting balance - Comments: steady reaching within BOS while performing LB ADLs sitting EOB   Standing balance support: Bilateral upper extremity supported, During functional activity, Reliant on assistive device for balance Standing balance-Leahy Scale: Fair Standing balance comment: min guard while hiking underwear in standing                           ADL either performed or assessed with clinical judgement   ADL Overall ADL's : Needs assistance/impaired                 Upper Body Dressing : Set up;Sitting   Lower Body Dressing: Moderate assistance Lower Body Dressing Details (indicate cue type and reason): assist to thread BLEs into underwear while seated, able to hike to knee level once over feet, and able to assist with hiking to waist level while standing with min guard for balance.   Toilet Transfer Details (indicate cue type and reason): anticipate no more than min A based on strong UB and STS transfer from bed with min guard with good ability to step and turn with RW.         Functional mobility during  ADLs: Contact guard assist;Rolling walker (2 wheels)       Vision Patient Visual Report: No change from baseline                          Pertinent Vitals/Pain Pain Assessment Pain Assessment: 0-10 Pain Score: 7  Pain Location: R knee Pain Descriptors / Indicators: Discomfort, Grimacing, Sore Pain Intervention(s): Monitored during session, Repositioned, Ice applied, Premedicated before session     Extremity/Trunk Assessment Upper Extremity Assessment Upper Extremity Assessment: Overall WFL for tasks assessed    Lower Extremity Assessment Lower Extremity Assessment: Defer to PT evaluation RLE Deficits / Details: s/p R TKA RLE: Unable to fully assess due to pain RLE Coordination: decreased gross motor   Cervical / Trunk Assessment Cervical / Trunk Assessment: Normal   Communication Communication Communication: No apparent difficulties   Cognition Arousal: Alert Behavior During Therapy: WFL for tasks assessed/performed                                 Following commands: Intact       Cueing  General Comments   Cueing Techniques: Verbal cues;Gestural cues      Exercises Other Exercises Other Exercises: educ on OT role, goals, poc   Shoulder Instructions      Home Living Family/patient expects to be discharged to:: Private residence Living Arrangements: Alone Available Help at Discharge: Family;Available 24 hours/day   Home Access: Stairs to enter Entrance Stairs-Number of Steps: 4   Home Layout: One level         Firefighter: Standard     Home Equipment: Agricultural consultant (2 wheels)          Prior Functioning/Environment Prior Level of Function : Independent/Modified Independent;Driving             Mobility Comments: not using AD prior to surgery ADLs Comments: indep    OT Problem List: Decreased knowledge of use of DME or AE;Decreased knowledge of precautions;Impaired balance (sitting and/or standing);Pain   OT Treatment/Interventions: Self-care/ADL training;Patient/family education;Balance training;DME and/or AE instruction      OT Goals(Current goals can be found in the care plan section)   Acute Rehab OT Goals Patient Stated Goal: Home with daughter OT Goal Formulation: With patient Time For Goal Achievement: 09/18/23 Potential to Achieve Goals: Good ADL Goals Pt Will Perform Lower Body Dressing: with min assist;sit to/from stand Pt Will Transfer to Toilet: ambulating;with supervision;grab bars Pt Will Perform Toileting -  Clothing Manipulation and hygiene: with supervision;sitting/lateral leans   OT Frequency:  Min 1X/week    Co-evaluation PT/OT/SLP Co-Evaluation/Treatment: Yes Reason for Co-Treatment: For patient/therapist safety;To address functional/ADL transfers PT goals addressed during session: Mobility/safety with mobility;Balance;Proper use of DME OT goals addressed during session: ADL's and self-care;Proper use of [employer] equipment and DME      AM-PAC OT "6 Clicks" Daily Activity     Outcome Measure Help from another person eating meals?: None Help from another person taking care of personal grooming?: None Help from another person toileting, which includes using toliet, bedpan, or urinal?: A Little Help from another person bathing (including washing, rinsing, drying)?: A Little Help from another person to put on and taking off regular upper body clothing?: None Help from another person to put on and taking off regular lower body clothing?: A Lot 6 Click Score: 20   End of Session Equipment Utilized During Treatment: Gait belt;Rolling walker (  2 wheels)  Activity Tolerance: Patient tolerated treatment well Patient left: in chair;with call bell/phone within reach;with chair alarm set  OT Visit Diagnosis: Unsteadiness on feet (R26.81);Pain;Other abnormalities of gait and mobility (R26.89) Pain - Right/Left: Right Pain - part of body: Knee                Time: 1610-9604 OT Time Calculation (min): 27 min Charges:  OT General Charges $OT Visit: 1 Visit OT Evaluation $OT Eval Moderate Complexity: 1 Mod  Aaron Earthly, MS, OTR/L   Aaron [name] 09/04/2023, 11:41 AM

## 2023-09-04 NOTE — Evaluation (Signed)
 Physical Therapy Evaluation Patient Details Name: Aaron [name]. MRN: [id number] DOB: Mar 17, 1946 Today's Date: 09/04/2023  History of Present Illness  Patient is s/p R TKA. History of L TKA.  Clinical Impression  Patient received in bed, he is agreeable to PT session. Patient is mod I for bed mobility. Transfers with cga, cues needed for safety. Patient is able to ambulate 25 feet with RW and cga. Cues for sequencing and safety. He will continue to benefit from skilled PT to improve independence, strength and ROM.         If plan is discharge home, recommend the following: A little help with walking and/or transfers;A little help with bathing/dressing/bathroom;Assistance with cooking/housework;Assist for transportation;Help with stairs or ramp for entrance   Can travel by private vehicle    yes    Equipment Recommendations Rolling walker (2 wheels)  Recommendations for Other Services       Functional Status Assessment Patient has had a recent decline in their functional status and demonstrates the ability to make significant improvements in function in a reasonable and predictable amount of time.     Precautions / Restrictions Precautions Precautions: Fall Recall of Precautions/Restrictions: Intact Restrictions Weight Bearing Restrictions Per Provider Order: Yes RLE Weight Bearing Per Provider Order: Weight bearing as tolerated      Mobility  Bed Mobility Overal bed mobility: Modified Independent             General bed mobility comments: increased time, effort, use of bed rails. No physical assist needed    Transfers Overall transfer level: Needs assistance Equipment used: Rolling walker (2 wheels) Transfers: Sit to/from Stand Sit to Stand: From elevated surface, Contact guard assist           General transfer comment: Cues for safe hand placement    Ambulation/Gait Ambulation/Gait assistance: Contact guard assist Gait Distance (Feet): 25  Feet Assistive device: Rolling walker (2 wheels) Gait Pattern/deviations: Step-to pattern, Decreased step length - right, Decreased step length - left, Decreased stride length, Decreased weight shift to right Gait velocity: Decr     General Gait Details: patient requires cues for sequencing, staying within RW.  Stairs            Wheelchair Mobility     Tilt Bed    Modified Rankin (Stroke Patients Only)       Balance Overall balance assessment: Needs assistance Sitting-balance support: Feet supported Sitting balance-Leahy Scale: Good     Standing balance support: Bilateral upper extremity supported, During functional activity, Reliant on assistive device for balance Standing balance-Leahy Scale: Fair                               Pertinent Vitals/Pain Pain Assessment Pain Assessment: 0-10 Pain Score: 7  Pain Location: R knee Pain Descriptors / Indicators: Discomfort, Grimacing, Sore Pain Intervention(s): Monitored during session, Repositioned, Ice applied, Premedicated before session    Home Living Family/patient expects to be discharged to:: Private residence Living Arrangements: Alone Available Help at Discharge: Family;Available 24 hours/day (going to daughter's house upon discharge)   Home Access: Stairs to enter   Entrance Stairs-Number of Steps: 4   Home Layout: One level Home Equipment: Agricultural consultant (2 wheels)      Prior Function Prior Level of Function : Independent/Modified Independent;Driving             Mobility Comments: not using AD prior to surgery  Extremity/Trunk Assessment   Upper Extremity Assessment Upper Extremity Assessment: Defer to OT evaluation    Lower Extremity Assessment Lower Extremity Assessment: RLE deficits/detail RLE: Unable to fully assess due to pain RLE Coordination: decreased gross motor    Cervical / Trunk Assessment Cervical / Trunk Assessment: Normal  Communication    Communication Communication: No apparent difficulties    Cognition Arousal: Alert Behavior During Therapy: WFL for tasks assessed/performed   PT - Cognitive impairments: No apparent impairments                         Following commands: Intact       Cueing Cueing Techniques: Verbal cues, Gestural cues     General Comments      Exercises Total Joint Exercises Ankle Circles/Pumps: AROM, Both, 5 reps Goniometric ROM: 0-70   Assessment/Plan    PT Assessment Patient needs continued PT services  PT Problem List Decreased strength;Decreased range of motion;Decreased activity tolerance;Decreased balance;Decreased mobility;Decreased safety awareness;Pain       PT Treatment Interventions DME instruction;Gait training;Stair training;Functional mobility training;Therapeutic activities;Therapeutic exercise;Balance training;Neuromuscular re-education;Patient/family education    PT Goals (Current goals can be found in the Care Plan section)  Acute Rehab PT Goals Patient Stated Goal: to go to daughter's home after discharge PT Goal Formulation: With patient Time For Goal Achievement: 09/06/23 Potential to Achieve Goals: Good    Frequency BID     Co-evaluation PT/OT/SLP Co-Evaluation/Treatment: Yes Reason for Co-Treatment: For patient/therapist safety;To address functional/ADL transfers PT goals addressed during session: Mobility/safety with mobility;Balance;Proper use of DME         AM-PAC PT "6 Clicks" Mobility  Outcome Measure Help needed turning from your back to your side while in a flat bed without using bedrails?: A Little Help needed moving from lying on your back to sitting on the side of a flat bed without using bedrails?: A Little Help needed moving to and from a bed to a chair (including a wheelchair)?: A Little Help needed standing up from a chair using your arms (e.g., wheelchair or bedside chair)?: A Little Help needed to walk in hospital room?: A  Little Help needed climbing 3-5 steps with a railing? : A Little 6 Click Score: 18    End of Session Equipment Utilized During Treatment: Gait belt Activity Tolerance: Patient tolerated treatment well;Patient limited by pain Patient left: in chair;with call bell/phone within reach;with chair alarm set Nurse Communication: Mobility status PT Visit Diagnosis: Other abnormalities of gait and mobility (R26.89);Muscle weakness (generalized) (M62.81);Difficulty in walking, not elsewhere classified (R26.2);Pain Pain - Right/Left: Right Pain - part of body: Knee    Time: 4782-9562 PT Time Calculation (min) (ACUTE ONLY): 27 min   Charges:   PT Evaluation $PT Eval Moderate Complexity: 1 Mod   PT General Charges $$ ACUTE PT VISIT: 1 Visit         Santosh Petter, PT, GCS 09/04/23,10:05 AM

## 2023-09-04 NOTE — Progress Notes (Signed)
 Physical Therapy Treatment Patient Details Name: Aaron [name]. MRN: [id number] DOB: 1945-09-29 Today's Date: 09/04/2023   History of Present Illness Patient is s/p R TKA. History of L TKA.    PT Comments  Patient received in bed, eating lunch. He is agreeable to PT session. Patient does not recall if he's had pain medicine. Stiffness/soreness with mobility this session. RN gave tylenol at beginning of session. He is able to increase ambulation distance, however limited by pain. He ambulated up/down 4 steps with B rails and cga. Patient is making good progress and will continue to benefit from skilled PT to improve safety, independence, strength and rom.      If plan is discharge home, recommend the following: A little help with walking and/or transfers;A little help with bathing/dressing/bathroom;Assistance with cooking/housework;Assist for transportation;Help with stairs or ramp for entrance   Can travel by private vehicle      Yes with assistance  Equipment Recommendations  Rolling walker (2 wheels)    Recommendations for Other Services       Precautions / Restrictions Precautions Precautions: Fall Recall of Precautions/Restrictions: Intact Restrictions Weight Bearing Restrictions Per Provider Order: Yes RLE Weight Bearing Per Provider Order: Weight bearing as tolerated     Mobility  Bed Mobility Overal bed mobility: Modified Independent             General bed mobility comments: increased time, effort, use of bed rails. No physical assist needed    Transfers Overall transfer level: Needs assistance Equipment used: Rolling walker (2 wheels) Transfers: Sit to/from Stand Sit to Stand: Min assist           General transfer comment: Ques for safe hand placement from bed and recliner    Ambulation/Gait Ambulation/Gait assistance: Contact guard assist Gait Distance (Feet): 125 Feet Assistive device: Rolling walker (2 wheels) Gait Pattern/deviations:  Step-to pattern, Decreased step length - right, Decreased step length - left, Decreased stride length, Trunk flexed Gait velocity: Decr     General Gait Details: patient requires cues for sequencing, staying within RW.   Stairs Stairs: Yes Stairs assistance: Contact guard assist Stair Management: Two rails, Forwards Number of Stairs: 4 General stair comments: generally safe with supervision/cga on steps   Wheelchair Mobility     Tilt Bed    Modified Rankin (Stroke Patients Only)       Balance Overall balance assessment: Needs assistance Sitting-balance support: Feet supported Sitting balance-Leahy Scale: Good     Standing balance support: Bilateral upper extremity supported, During functional activity, Reliant on assistive device for balance Standing balance-Leahy Scale: Fair Standing balance comment: cga for safety on steps and with ambulation                            Communication Communication Communication: No apparent difficulties  Cognition Arousal: Alert Behavior During Therapy: WFL for tasks assessed/performed   PT - Cognitive impairments: No apparent impairments                         Following commands: Intact      Cueing Cueing Techniques: Verbal cues  Exercises Total Joint Exercises Ankle Circles/Pumps: AROM, Both, 10 reps Quad Sets: AROM, Right, 5 reps Heel Slides: AAROM, Right, 5 reps Hip ABduction/ADduction: AAROM, Right, 5 reps Straight Leg Raises: Right, 5 reps, AAROM Goniometric ROM: 0-85    General Comments        Pertinent Vitals/Pain Pain Assessment  Pain Assessment: 0-10 Pain Score: 10-Worst pain ever Pain Location: R knee Pain Descriptors / Indicators: Discomfort, Grimacing, Sore Pain Intervention(s): Monitored during session, Repositioned, Ice applied    Home Living Family/patient expects to be discharged to:: Private residence Living Arrangements: Alone Available Help at Discharge:  Family;Available 24 hours/day   Home Access: Stairs to enter   Entrance Stairs-Number of Steps: 4   Home Layout: One level Home Equipment: Agricultural consultant (2 wheels)      Prior Function            PT Goals (current goals can now be found in the care plan section) Acute Rehab PT Goals Patient Stated Goal: to go to daughter's home after discharge PT Goal Formulation: With patient Time For Goal Achievement: 09/06/23 Potential to Achieve Goals: Good Progress towards PT goals: Progressing toward goals    Frequency    BID      PT Plan      Co-evaluation PT/OT/SLP Co-Evaluation/Treatment: Yes Reason for Co-Treatment: For patient/therapist safety;To address functional/ADL transfers PT goals addressed during session: Mobility/safety with mobility;Balance;Proper use of DME OT goals addressed during session: ADL's and self-care;Proper use of [employer] equipment and DME      AM-PAC PT "6 Clicks" Mobility   Outcome Measure  Help needed turning from your back to your side while in a flat bed without using bedrails?: A Little Help needed moving from lying on your back to sitting on the side of a flat bed without using bedrails?: A Little Help needed moving to and from a bed to a chair (including a wheelchair)?: A Little Help needed standing up from a chair using your arms (e.g., wheelchair or bedside chair)?: A Little Help needed to walk in hospital room?: A Little Help needed climbing 3-5 steps with a railing? : A Little 6 Click Score: 18    End of Session Equipment Utilized During Treatment: Gait belt Activity Tolerance: Patient limited by pain Patient left: in chair;with call bell/phone within reach;with chair alarm set Nurse Communication: Mobility status PT Visit Diagnosis: Other abnormalities of gait and mobility (R26.89);Muscle weakness (generalized) (M62.81);Difficulty in walking, not elsewhere classified (R26.2);Pain Pain - Right/Left: Right Pain - part of body:  Knee     Time: 1300-1335 PT Time Calculation (min) (ACUTE ONLY): 35 min  Charges:    $Gait Training: 8-22 mins $Therapeutic Exercise: 8-22 mins PT General Charges $$ ACUTE PT VISIT: 1 Visit                     Laurynn Mccorvey, PT, GCS 09/04/23,1:49 PM

## 2023-09-04 NOTE — Anesthesia Postprocedure Evaluation (Addendum)
 Anesthesia Post Note  Patient: Garrit Marrow.  Procedure(s) Performed: ARTHROPLASTY, KNEE, TOTAL, USING IMAGELESS COMPUTER-ASSISTED NAVIGATION (Right: Knee)  Patient location during evaluation: Nursing Unit Anesthesia Type: Spinal Level of consciousness: awake and alert Pain management: pain level controlled Vital Signs Assessment: post-procedure vital signs reviewed and stable Respiratory status: spontaneous breathing, nonlabored ventilation and respiratory function stable Cardiovascular status: blood pressure returned to baseline and stable Postop Assessment: no apparent nausea or vomiting, patient able to bend at knees, no backache and no headache Anesthetic complications: no   No notable events documented.   Last Vitals:  Vitals:   09/04/23 0342 09/04/23 0803  BP: (!) 116/54 (!) 104/53  Pulse: 73 65  Resp: 16 18  Temp: 36.6 C 36.9 C  SpO2: 96% 97%    Last Pain:  Vitals:   09/04/23 0527  TempSrc:   PainSc: 2                  Foye Deer

## 2023-09-04 NOTE — Plan of Care (Signed)
  Problem: Health Behavior/Discharge Planning: Goal: Ability to manage health-related needs will improve Outcome: Progressing   Problem: Clinical Measurements: Goal: Ability to maintain clinical measurements within normal limits will improve Outcome: Progressing Goal: Will remain free from infection Outcome: Progressing Goal: Diagnostic test results will improve Outcome: Progressing Goal: Respiratory complications will improve Outcome: Progressing Goal: Cardiovascular complication will be avoided Outcome: Progressing   Problem: Nutrition: Goal: Adequate nutrition will be maintained Outcome: Progressing   Problem: Coping: Goal: Level of anxiety will decrease Outcome: Progressing   Problem: Elimination: Goal: Will not experience complications related to bowel motility Outcome: Progressing Goal: Will not experience complications related to urinary retention Outcome: Progressing   Problem: Pain Managment: Goal: General experience of comfort will improve and/or be controlled Outcome: Progressing   Problem: Safety: Goal: Ability to remain free from injury will improve Outcome: Progressing

## 2023-09-06 ENCOUNTER — Encounter: Payer: Self-pay | Admitting: Orthopedic Surgery

## 2023-09-21 ENCOUNTER — Other Ambulatory Visit: Payer: Self-pay

## 2023-09-21 DIAGNOSIS — R972 Elevated prostate specific antigen [PSA]: Secondary | ICD-10-CM

## 2023-09-23 ENCOUNTER — Other Ambulatory Visit: Payer: Medicare HMO

## 2023-09-23 ENCOUNTER — Ambulatory Visit: Payer: Medicare HMO | Admitting: Urology

## 2023-09-24 ENCOUNTER — Ambulatory Visit: Payer: Self-pay | Admitting: Urology

## 2023-10-20 ENCOUNTER — Other Ambulatory Visit: Payer: Self-pay | Admitting: Anesthesiology

## 2023-10-26 ENCOUNTER — Ambulatory Visit: Attending: Anesthesiology | Admitting: Anesthesiology

## 2023-10-26 ENCOUNTER — Encounter: Payer: Self-pay | Admitting: Anesthesiology

## 2023-10-26 VITALS — BP 107/66 | HR 89 | Temp 96.6°F | Resp 16 | Ht 72.0 in | Wt 240.0 lb

## 2023-10-26 DIAGNOSIS — M961 Postlaminectomy syndrome, not elsewhere classified: Secondary | ICD-10-CM | POA: Diagnosis present

## 2023-10-26 DIAGNOSIS — G8929 Other chronic pain: Secondary | ICD-10-CM | POA: Diagnosis present

## 2023-10-26 DIAGNOSIS — M5136 Other intervertebral disc degeneration, lumbar region with discogenic back pain only: Secondary | ICD-10-CM | POA: Insufficient documentation

## 2023-10-26 DIAGNOSIS — M5410 Radiculopathy, site unspecified: Secondary | ICD-10-CM | POA: Insufficient documentation

## 2023-10-26 DIAGNOSIS — M79604 Pain in right leg: Secondary | ICD-10-CM | POA: Insufficient documentation

## 2023-10-26 DIAGNOSIS — M5441 Lumbago with sciatica, right side: Secondary | ICD-10-CM | POA: Diagnosis present

## 2023-10-26 DIAGNOSIS — M79605 Pain in left leg: Secondary | ICD-10-CM | POA: Insufficient documentation

## 2023-10-26 DIAGNOSIS — M5417 Radiculopathy, lumbosacral region: Secondary | ICD-10-CM | POA: Diagnosis present

## 2023-10-26 MED ORDER — TRAMADOL HCL 50 MG PO TABS: 50.0000 mg | ORAL_TABLET | Freq: Three times a day (TID) | ORAL | 1 refills | Status: DC

## 2023-10-26 NOTE — Progress Notes (Unsigned)
 Nursing Pain Medication Assessment:  Safety precautions to be maintained throughout the outpatient stay will include: orient to surroundings, keep bed in low position, maintain call bell within reach at all times, provide assistance with transfer out of bed and ambulation.  Medication Inspection Compliance: Aaron [name] did not comply with our request to bring his pills to be counted. He was reminded that bringing the medication bottles, even when empty, is a requirement.  Medication: None brought in. Pill/Patch Count: None available to be counted. Bottle Appearance: No container available. Did not bring bottle(s) to appointment. Filled Date: N/A Last Medication intake:  Today  Patient got medication for pain when he had the knee replacement in January

## 2023-10-27 ENCOUNTER — Telehealth: Payer: Self-pay

## 2023-10-27 NOTE — Telephone Encounter (Signed)
  [facility] Outside Information   X-ray lumbar spine 4 plus views  Anatomical Region Laterality Modality  Spine Lumbar -- Radiographic Imaging  Spine Thoracic -- --  Pelvis -- --  ORTHO Spine Lumbar -- --   Narrative  I ordered and interpreted AP, lateral, and oblique radiographs of the lumbar spine that were obtained in the office today.  There is no significant scoliosis.  There is loss of the normal lumbar lordosis.   Degenerative disc disease is noted throughout the lumbar spine. Facet hypertrophy is present. Exam End: -- Last Resulted: 10/14/23 10:18  Received From: [facility]  Result Received: 10/20/23 14:43

## 2023-10-27 NOTE — Progress Notes (Signed)
 Subjective:  Patient ID: Aaron Porteous., male    DOB: 02-24-1946  Age: 78 y.o. MRN: [id number]  CC: Hip Pain (right) and Back Pain (low)   Procedure: None  HPI Aaron Porteous. presents for reevaluation.  Aaron [name] is doing reasonably well but is having some persistent knee pain some persistent right posterior lateral hip and leg pain occasionally this radiates down into the right calf.  He was recently seen by Dr. Hooten for orthopedic evaluation.  Dr. Melva Stabile did not feel this was coming from his hip but more likely his back and his recommended possible epidural steroid injection.  Aaron [name] has had previous back surgeries and has a significant lumbar scar after 3 previous surgeries.  He feels that the pain is a gnawing aching pain worse with prolonged standing and aggravates him with certain activity and motions.  He has had this in the past and it has responded to steroid injections.  He is questioning whether this might be of benefit or available to him.  Otherwise he is in his usual state of health taking his tramadol  about 3 times a day for total of 90 tablets on average per month.  He does have frequent breakthrough about 4 to 6 hours after dosing and sometimes later at night.  Otherwise he is in his usual state of health at this time.  Outpatient Medications Prior to Visit  Medication Sig Dispense Refill   amLODipine  (NORVASC ) 5 MG tablet Take 2 tablets (10 mg total) by mouth daily. (Patient taking differently: Take 5 mg by mouth every morning.) 30 tablet 1   aspirin  81 MG chewable tablet Chew 1 tablet (81 mg total) by mouth 2 (two) times daily.     augmented betamethasone dipropionate (DIPROLENE-AF) 0.05 % cream Apply 1 application  topically 2 (two) times daily as needed (Rash behind ear).     carvedilol  (COREG ) 3.125 MG tablet Take 2 tablets (6.25 mg total) by mouth 2 (two) times daily with a meal. (Patient taking differently: Take 3.125 mg by mouth 2 (two) times daily with a  meal.) 60 tablet 0   celecoxib  (CELEBREX ) 200 MG capsule Take 1 capsule (200 mg total) by mouth 2 (two) times daily. 60 capsule 1   ciprofloxacin  (CILOXAN ) 0.3 % ophthalmic solution Place 2 drops into both eyes every 4 (four) hours while awake. Administer 1 drop, every 2 hours, while awake, for 2 days. Then 1 drop, every 4 hours, while awake, for the next 5 days. 5 mL 0   Coenzyme Q10 (CO Q-10 PO) Take 200 mg by mouth daily. PT takes with Co Q-10 400 mg     Coenzyme Q10-Fish Oil -Vit E (CO-Q 10 OMEGA-3 FISH OIL  PO) Take 400 mg by mouth daily. Pt takes with Co Q-10 200 mg     colchicine  0.6 MG tablet Take 0.6 mg by mouth daily as needed (Gout).     Continuous Glucose Receiver (FREESTYLE LIBRE 3 READER) DEVI 1 (ONE) EACH FOR CONTINUOUS GLUCOSE MONITORING     Continuous Glucose Sensor (FREESTYLE LIBRE 3 SENSOR) MISC 1 (ONE) KIT APPLIED EVERY 14 DAYS FOR GLUCOSE MONITORING     ezetimibe  (ZETIA ) 10 MG tablet Take 1 tablet (10 mg total) by mouth daily. (Patient taking differently: Take 10 mg by mouth every morning.) 30 tablet 0   Flaxseed, Linseed, (FLAXSEED OIL PO) Take 1 tablet by mouth daily at 6 (six) AM.     glipiZIDE  (GLUCOTROL  XL) 10 MG 24 hr tablet Take 10 mg by  mouth 2 (two) times daily.     ketoconazole (NIZORAL) 2 % shampoo Apply 1 Application topically daily as needed for irritation.     ketorolac  (ACULAR ) 0.5 % ophthalmic solution Place 1 drop into the right eye in the morning, at noon, and at bedtime. 5 mL 0   MANGANESE PO Take 1 tablet by mouth daily at 6 (six) AM.     Menaquinone-7 (VITAMIN K2 ) 100 MCG CAPS Take 1 capsule by mouth daily.     metFORMIN  (GLUCOPHAGE -XR) 500 MG 24 hr tablet Take 1,000 mg by mouth 2 (two) times daily with a meal.     nitroGLYCERIN  (NITROSTAT ) 0.4 MG SL tablet Take 0.4 mg by mouth every 5 (five) minutes as needed for chest pain.      Omega-3 Fatty Acids (OMEGA-3 FISH OIL  PO) Take 2,000 mg by mouth daily.     OVER THE COUNTER MEDICATION Take 2 capsules by mouth  daily. Vision [employer] Adult [employer]  with Lutein      oxyCODONE  (OXY IR/ROXICODONE ) 5 MG immediate release tablet Take 1 tablet (5 mg total) by mouth every 4 (four) hours as needed for moderate pain (pain score 4-6) (pain score 4-6). 30 tablet 0   REPATHA SURECLICK 140 MG/ML SOAJ Inject 140 mg into the skin every 14 (fourteen) days.     traMADol  (ULTRAM ) 50 MG tablet Take 2 tablets (100 mg total) by mouth daily as needed for moderate pain (pain score 4-6) or severe pain (pain score 7-10). 30 tablet 0   VASCEPA  1 g capsule Take 2 g by mouth 2 (two) times daily.     vitamin B-12 (CYANOCOBALAMIN ) 1000 MCG tablet Take 1,000 mcg by mouth daily.     Zinc  100 MG TABS Take 1 tablet by mouth daily at 6 (six) AM.     No facility-administered medications prior to visit.    Review of Systems CNS: No confusion or sedation Cardiac: No angina or palpitations GI: No abdominal pain or constipation Constitutional: No nausea vomiting fevers or chills  Objective:  BP 107/66   Pulse 89   Temp (!) 96.6 F (35.9 C)   Resp 16   Ht 6' (1.829 m)   Wt 240 lb (108.9 kg)   SpO2 99%   BMI 32.55 kg/m    BP Readings from Last 3 Encounters:  10/26/23 107/66  09/04/23 (!) 130/56  08/27/23 135/69     Wt Readings from Last 3 Encounters:  10/26/23 240 lb (108.9 kg)  08/27/23 240 lb 4.8 oz (109 kg)  08/06/23 240 lb (108.9 kg)     Physical Exam Pt is alert and oriented PERRL EOMI HEART IS RRR no murmur or rub LCTA no wheezing or rales MUSCULOSKELETAL reveals some paraspinous muscle tenderness across the lumbar spine but no trigger points.  He does have an antalgic gait and a well-healed midline scar in the lumbar region.  He does have some pain on extension with left and right lateral rotation while in the standing position.  This is in the lumbar region.  He does have a positive straight leg raise on the right side negative on the left.  Muscle tone and bulk is at baseline.  Labs  Lab Results   Component Value Date   HGBA1C 6.6 (H) 08/27/2023   HGBA1C 8.2 (H) 12/17/2022   HGBA1C 8.1 (H) 12/20/2020   Lab Results  Component Value Date   LDLCALC NOT CALCULATED 12/18/2022   CREATININE 1.22 08/27/2023    -------------------------------------------------------------------------------------------------------------------- Lab Results  Component Value  Date   WBC 6.1 08/27/2023   HGB 14.2 08/27/2023   HCT 41.4 08/27/2023   PLT 206 08/27/2023   GLUCOSE 158 (H) 08/27/2023   CHOL 60 12/18/2022   TRIG 245 (H) 12/18/2022   HDL 30 (L) 12/18/2022   LDLDIRECT 49.6 12/21/2020   LDLCALC NOT CALCULATED 12/18/2022   ALT 26 08/27/2023   AST 22 08/27/2023   NA 138 08/27/2023   K 4.4 08/27/2023   CL 104 08/27/2023   CREATININE 1.22 08/27/2023   BUN 27 (H) 08/27/2023   CO2 24 08/27/2023   INR 1.0 12/17/2022   HGBA1C 6.6 (H) 08/27/2023    --------------------------------------------------------------------------------------------------------------------- DG Knee Right Port Result Date: 09/03/2023 CLINICAL DATA:  Status post total knee replacement EXAM: PORTABLE RIGHT KNEE - 2 VIEW COMPARISON:  None Available. FINDINGS: Surgical changes of total knee arthroplasty. Cemented tibial component. Press-Fit femoral component. Patellar button. Expected alignment. There is also some linear lucencies along the proximal tibial shaft which may be a previous areas of metallic screws. Similar areas along the distal femur. Hyperostosis along the dorsal aspect of the patella. Expected alignment. Overlapping drain. Skin staples. Imaging was obtained to aid in treatment. IMPRESSION: Acute surgical changes of total knee arthroplasty. Electronically Signed   By: Adrianna Horde M.D.   On: 09/03/2023 13:45     Assessment & Plan:   Aaron [name] was seen today for hip pain and back pain.  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 Failed back surgical syndrome (Primary) I think it would be reasonable to proceed with a caudal epidural steroid and we will schedule this.  Hopefully this can help with some of the sciatica symptoms and possibly the right hip pain.  We gone over the risks and benefits of the procedure with him in full detail and all questions are answered.  Continue with home  physical therapy modalities with stretching strengthening exercises  as tolerated and we will increase his tramadol  to 4 times daily dosing to see if this can help cover the breakthrough pain he is having later at night.  2. Chronic bilateral low back pain with right-sided sciatica As above  3. Chronic pain of lower extremity, bilateral As above - Lumbar Epidural Injection; Future  4. Chronic radicular pain of lower extremity   5. 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 Degeneration of intervertebral disc of lumbar region with discogenic back pain As above and return to clinic at the next available date likely 3 weeks for procedure injection. - Lumbar Epidural Injection; Future    ----------------------------------------------------------------------------------------------------------------------  I am having Aaron Heal. Case Jr. start on traMADol . I am also having him maintain his nitroGLYCERIN , ezetimibe , cyanocobalamin , metFORMIN , Vascepa , augmented betamethasone dipropionate, colchicine , Repatha SureClick, carvedilol , amLODipine , FreeStyle Libre 3 Sensor, [employer] 3 Reader, ketoconazole, glipiZIDE , Coenzyme Q10 (CO Q-10 PO), Coenzyme Q10-Fish Oil -Vit E (CO-Q 10 OMEGA-3 FISH OIL  PO), (Flaxseed, Linseed, (FLAXSEED OIL PO)), Omega-3 Fatty Acids (OMEGA-3 FISH OIL  PO), Zinc , Vitamin K2 , MANGANESE PO, OVER THE COUNTER MEDICATION, aspirin , traMADol , celecoxib , oxyCODONE , ciprofloxacin , and ketorolac .   Meds ordered this encounter  Medications   traMADol  (ULTRAM ) 50 MG tablet    Sig: Take 1 tablet (50 mg total) by mouth 3 (three) times daily.    Dispense:  120 tablet    Refill:  1   Patient's Medications  New Prescriptions   TRAMADOL  (ULTRAM ) 50 MG TABLET    Take 1 tablet (50 mg total) by mouth 3 (three) times daily.  Previous Medications   AMLODIPINE  (NORVASC ) 5 MG TABLET    Take 2 tablets (10 mg total) by mouth daily.   ASPIRIN  81  MG CHEWABLE TABLET    Chew 1 tablet (81 mg total) by mouth 2 (two) times daily.   AUGMENTED BETAMETHASONE DIPROPIONATE (DIPROLENE-AF) 0.05 % CREAM    Apply 1 application  topically 2 (two) times daily as needed (Rash behind ear).   CARVEDILOL  (COREG ) 3.125 MG TABLET    Take 2 tablets (6.25 mg total) by mouth 2 (two) times daily with a meal.   CELECOXIB  (CELEBREX ) 200 MG CAPSULE    Take 1 capsule (200 mg total) by mouth 2 (two) times daily.   CIPROFLOXACIN  (CILOXAN ) 0.3 % OPHTHALMIC SOLUTION    Place 2 drops into both eyes every 4 (four) hours while awake. Administer 1 drop, every 2 hours, while awake, for 2 days. Then 1 drop, every 4 hours, while awake, for the next 5 days.   COENZYME Q10 (CO Q-10 PO)    Take 200 mg by mouth daily. PT takes with Co Q-10 400 mg   COENZYME Q10-FISH OIL -VIT E (CO-Q 10 OMEGA-3 FISH OIL  PO)    Take 400 mg by mouth daily. Pt takes with Co Q-10 200 mg   COLCHICINE  0.6 MG TABLET    Take 0.6 mg by mouth daily as needed (Gout).   CONTINUOUS GLUCOSE RECEIVER (FREESTYLE LIBRE 3 READER) DEVI    1 (ONE) EACH FOR CONTINUOUS GLUCOSE MONITORING   CONTINUOUS GLUCOSE SENSOR (FREESTYLE LIBRE 3 SENSOR) MISC    1 (ONE) KIT APPLIED EVERY 14 DAYS FOR GLUCOSE MONITORING   EZETIMIBE  (ZETIA ) 10 MG TABLET    Take 1 tablet (10 mg total) by mouth daily.   FLAXSEED, LINSEED, (FLAXSEED OIL PO)    Take 1 tablet by mouth daily at 6 (six) AM.   GLIPIZIDE  (GLUCOTROL  XL) 10 MG 24 HR TABLET  Take 10 mg by mouth 2 (two) times daily.   KETOCONAZOLE (NIZORAL) 2 % SHAMPOO    Apply 1 Application topically daily as needed for irritation.   KETOROLAC  (ACULAR ) 0.5 % OPHTHALMIC SOLUTION    Place 1 drop into the right eye in the morning, at noon, and at bedtime.   MANGANESE PO    Take 1 tablet by mouth daily at 6 (six) AM.   MENAQUINONE-7 (VITAMIN K2 ) 100 MCG CAPS    Take 1 capsule by mouth daily.   METFORMIN  (GLUCOPHAGE -XR) 500 MG 24 HR TABLET    Take 1,000 mg by mouth 2 (two) times daily with a meal.    NITROGLYCERIN  (NITROSTAT ) 0.4 MG SL TABLET    Take 0.4 mg by mouth every 5 (five) minutes as needed for chest pain.    OMEGA-3 FATTY ACIDS (OMEGA-3 FISH OIL  PO)    Take 2,000 mg by mouth daily.   OVER THE COUNTER MEDICATION    Take 2 capsules by mouth daily. Vision [employer] Adult [employer]  with Lutein    OXYCODONE  (OXY IR/ROXICODONE ) 5 MG IMMEDIATE RELEASE TABLET    Take 1 tablet (5 mg total) by mouth every 4 (four) hours as needed for moderate pain (pain score 4-6) (pain score 4-6).   REPATHA SURECLICK 140 MG/ML SOAJ    Inject 140 mg into the skin every 14 (fourteen) days.   TRAMADOL  (ULTRAM ) 50 MG TABLET    Take 2 tablets (100 mg total) by mouth daily as needed for moderate pain (pain score 4-6) or severe pain (pain score 7-10).   VASCEPA  1 G CAPSULE    Take 2 g by mouth 2 (two) times daily.   VITAMIN B-12 (CYANOCOBALAMIN ) 1000 MCG TABLET    Take 1,000 mcg by mouth daily.   ZINC  100 MG TABS    Take 1 tablet by mouth daily at 6 (six) AM.  Modified Medications   No medications on file  Discontinued Medications   No medications on file   ----------------------------------------------------------------------------------------------------------------------  Follow-up: Return in about 1 month (around 11/26/2023) for evaluation, procedure.    Zula Hitch, MD

## 2023-10-27 NOTE — Telephone Encounter (Addendum)
 This is what his insurance is needing for the [place]. Please let me know when its done. Thanks. I have xrays.   ADDITIONAL CLINICAL INFORMATION NEEDED: In order to reach a medical necessity  determination on this case, please provide [x]  farthest radicular symptoms associated with back  pain or neuro claudication symptoms, [x]  Imaging Results (x-ray, CT, MRI of spinal region to be  injected) related to this request.

## 2023-10-28 NOTE — Telephone Encounter (Signed)
 Im sorry, I don't see any new note. Where did you put it?

## 2023-11-02 NOTE — Telephone Encounter (Signed)
 They denied authorization. I put the letter on your desk.

## 2023-11-09 NOTE — Telephone Encounter (Signed)
 I resubmitted the auth and was able to get it approved. Patient scheduled.

## 2023-11-29 ENCOUNTER — Other Ambulatory Visit: Payer: Self-pay | Admitting: Anesthesiology

## 2023-11-29 ENCOUNTER — Ambulatory Visit: Admitting: Anesthesiology

## 2023-11-29 ENCOUNTER — Encounter: Payer: Self-pay | Admitting: Anesthesiology

## 2023-11-29 ENCOUNTER — Ambulatory Visit
Admission: RE | Admit: 2023-11-29 | Discharge: 2023-11-29 | Disposition: A | Discharge status: Home / Self Care | Source: Ambulatory Visit | Attending: Anesthesiology | Admitting: Anesthesiology

## 2023-11-29 VITALS — BP 167/91 | HR 80 | Temp 98.3°F | Resp 16 | Ht 72.0 in | Wt 240.0 lb

## 2023-11-29 DIAGNOSIS — M5136 Other intervertebral disc degeneration, lumbar region with discogenic back pain only: Secondary | ICD-10-CM

## 2023-11-29 DIAGNOSIS — G8929 Other chronic pain: Secondary | ICD-10-CM | POA: Insufficient documentation

## 2023-11-29 DIAGNOSIS — M51362 Other intervertebral disc degeneration, lumbar region with discogenic back pain and lower extremity pain: Secondary | ICD-10-CM | POA: Diagnosis not present

## 2023-11-29 DIAGNOSIS — R52 Pain, unspecified: Secondary | ICD-10-CM

## 2023-11-29 DIAGNOSIS — M5417 Radiculopathy, lumbosacral region: Secondary | ICD-10-CM

## 2023-11-29 DIAGNOSIS — M79605 Pain in left leg: Secondary | ICD-10-CM | POA: Diagnosis present

## 2023-11-29 DIAGNOSIS — G894 Chronic pain syndrome: Secondary | ICD-10-CM

## 2023-11-29 DIAGNOSIS — Z79891 Long term (current) use of opiate analgesic: Secondary | ICD-10-CM | POA: Diagnosis present

## 2023-11-29 DIAGNOSIS — M5410 Radiculopathy, site unspecified: Secondary | ICD-10-CM | POA: Insufficient documentation

## 2023-11-29 DIAGNOSIS — M5416 Radiculopathy, lumbar region: Secondary | ICD-10-CM | POA: Insufficient documentation

## 2023-11-29 DIAGNOSIS — M961 Postlaminectomy syndrome, not elsewhere classified: Secondary | ICD-10-CM

## 2023-11-29 DIAGNOSIS — M5441 Lumbago with sciatica, right side: Secondary | ICD-10-CM | POA: Diagnosis present

## 2023-11-29 DIAGNOSIS — M79604 Pain in right leg: Secondary | ICD-10-CM | POA: Diagnosis not present

## 2023-11-29 MED ORDER — SODIUM CHLORIDE 0.9% FLUSH
10.0000 mL | Freq: Once | INTRAVENOUS | Status: AC
  Administered 2023-11-29: 10 mL

## 2023-11-29 MED ORDER — ROPIVACAINE HCL 2 MG/ML IJ SOLN
10.0000 mL | Freq: Once | INTRAMUSCULAR | Status: AC
  Administered 2023-11-29: 10 mL via EPIDURAL

## 2023-11-29 MED ORDER — LIDOCAINE HCL (PF) 1 % IJ SOLN
5.0000 mL | Freq: Once | INTRAMUSCULAR | Status: AC
  Administered 2023-11-29: 5 mL via SUBCUTANEOUS

## 2023-11-29 MED ORDER — SODIUM CHLORIDE (PF) 0.9 % IJ SOLN
INTRAMUSCULAR | Status: AC
  Filled 2023-11-29: qty 10

## 2023-11-29 MED ORDER — ROPIVACAINE HCL 2 MG/ML IJ SOLN
INTRAMUSCULAR | Status: AC
  Filled 2023-11-29: qty 20

## 2023-11-29 MED ORDER — TRIAMCINOLONE ACETONIDE 40 MG/ML IJ SUSP
40.0000 mg | Freq: Once | INTRAMUSCULAR | Status: AC
  Administered 2023-11-29: 40 mg

## 2023-11-29 MED ORDER — TRIAMCINOLONE ACETONIDE 40 MG/ML IJ SUSP
INTRAMUSCULAR | Status: AC
  Filled 2023-11-29: qty 1

## 2023-11-29 MED ORDER — IOHEXOL 180 MG/ML  SOLN
INTRAMUSCULAR | Status: AC
  Filled 2023-11-29: qty 20

## 2023-11-29 MED ORDER — IOHEXOL 180 MG/ML  SOLN
10.0000 mL | Freq: Once | INTRAMUSCULAR | Status: AC | PRN
  Administered 2023-11-29: 10 mL via EPIDURAL

## 2023-11-29 MED ORDER — LIDOCAINE HCL (PF) 1 % IJ SOLN
INTRAMUSCULAR | Status: AC
  Filled 2023-11-29: qty 5

## 2023-11-29 MED ORDER — TRAMADOL HCL 50 MG PO TABS: 50.0000 mg | ORAL_TABLET | Freq: Four times a day (QID) | ORAL | 2 refills | Status: AC | PRN

## 2023-11-29 NOTE — Progress Notes (Signed)
 Safety precautions to be maintained throughout the outpatient stay will include: orient to surroundings, keep bed in low position, maintain call bell within reach at all times, provide assistance with transfer out of bed and ambulation.

## 2023-11-29 NOTE — Progress Notes (Signed)
Safety precautions to be maintained throughout the outpatient stay will include: orient to surroundings, keep bed in low position, maintain call bell within reach at all times, provide assistance with transfer out of bed and ambulation. ____________________________________________________________________________________________  Post-Procedure Discharge Instructions  Instructions:  Apply ice:   Purpose: This will minimize any swelling and discomfort after procedure.   When: Day of procedure, as soon as you get home.  How: Fill a plastic sandwich bag with crushed ice. Cover it with a small towel and apply to injection site.  How long: (15 min on, 15 min off) Apply for 15 minutes then remove x 15 minutes.  Repeat sequence on day of procedure, until you go to bed.  Apply heat:   Purpose: To treat any soreness and discomfort from the procedure.  When: Starting the next day after the procedure.  How: Apply heat to procedure site starting the day following the procedure.  How long: [place] continue to repeat daily, until discomfort goes away.  Food intake: Start with clear liquids (like water) and advance to regular food, as tolerated.   Physical activities: Keep activities to a minimum for the first 8 hours after the procedure. After that, then as tolerated.  Driving: If you have received any sedation, be responsible and do not drive. You are not allowed to drive for 24 hours after having sedation.  Blood thinner: (Applies only to those taking blood thinners) You [place] restart your blood thinner 6 hours after your procedure.  Insulin: (Applies only to Diabetic patients taking insulin) As soon as you can eat, you [place] resume your normal dosing schedule.  Infection prevention: Keep procedure site clean and dry. Shower daily and clean area with soap and water.  Post-procedure Pain Diary: Extremely important that this be done correctly and accurately. Recorded information will be used to  determine the next step in treatment. For the purpose of accuracy, follow these rules:  Evaluate only the area treated. Do not report or include pain from an untreated area. For the purpose of this evaluation, ignore all other areas of pain, except for the treated area.  After your procedure, avoid taking a long nap and attempting to complete the pain diary after you wake up. Instead, set your alarm clock to go off every hour, on the hour, for the initial 8 hours after the procedure. Document the duration of the numbing medicine, and the relief you are getting from it.  Do not go to sleep and attempt to complete it later. It will not be accurate. If you received sedation, it is likely that you were given a medication that [place] cause amnesia. Because of this, completing the diary at a later time [place] cause the information to be inaccurate. This information is needed to plan your care.  Follow-up appointment: Keep your post-procedure follow-up evaluation appointment after the procedure (usually 2 weeks for most procedures, 6 weeks for radiofrequencies). DO NOT FORGET to bring you pain diary with you.   Expect: (What should I expect to see with my procedure?)  From numbing medicine (AKA: Local Anesthetics): Numbness or decrease in pain. You [place] also experience some weakness, which if present, could last for the duration of the local anesthetic.  Onset: Full effect within 15 minutes of injected.  Duration: It will depend on the type of local anesthetic used. On the average, 1 to 8 hours.   From steroids (Applies only if steroids were used): Decrease in swelling or inflammation. Once inflammation is improved, relief of  the pain will follow.  Onset of benefits: Depends on the amount of swelling present. The more swelling, the longer it will take for the benefits to be seen. In some cases, up to 10 days.  Duration: Steroids will stay in the system x 2 weeks. Duration of benefits will depend on multiple  posibilities including persistent irritating factors.  Side-effects: If present, they [place] typically last 2 weeks (the duration of the steroids).  Frequent: Cramps (if they occur, drink Gatorade and take over-the-counter Magnesium 450-500 mg once to twice a day); water retention with temporary weight gain; increases in blood sugar; decreased immune system response; increased appetite.  Occasional: Facial flushing (red, warm cheeks); mood swings; menstrual changes.  Uncommon: Long-term decrease or suppression of natural hormones; bone thinning. (These are more common with higher doses or more frequent use. This is why we prefer that our patients avoid having any injection therapies in other practices.)   Very Rare: Severe mood changes; psychosis; aseptic necrosis.  From procedure: Some discomfort is to be expected once the numbing medicine wears off. This should be minimal if ice and heat are applied as instructed.  Call if: (When should I call?)  You experience numbness and weakness that gets worse with time, as opposed to wearing off.  New onset bowel or bladder incontinence. (Applies only to procedures done in the spine)  Emergency Numbers:  Durning business hours (Monday - Thursday, 8:00 AM - 4:00 PM) (Friday, 9:00 AM - 12:00 Noon): [phone]  After hours: [phone]  NOTE: If you are having a problem and are unable connect with, or to talk to a provider, then go to your nearest urgent care or emergency department. If the problem is serious and urgent, please call 911. ____________________________________________________________________________________________

## 2023-11-29 NOTE — Patient Instructions (Signed)

## 2023-11-29 NOTE — Progress Notes (Signed)
 Subjective:  Patient ID: Aaron Porteous., male    DOB: [birth year]  Age: 78 y.o. MRN: [id number]  CC: Back Pain   Procedure: Caudal epidural steroid  HPI Aaron Porteous. presents for reevaluation.  He is having some protracted right lower leg sciatica and ongoing back pain.  He presents today for caudal epidural steroid.  He still takes his tramadol  on a daily basis getting good relief from that.  He rates that about 50% however the right lower leg sciatica has been persistent despite conservative measures with stretching and medication management.  He desires to proceed with a injection today.  We gone over the risks and benefits of this in detail.  He takes a daily aspirin  but no other blood thinners.  Outpatient Medications Prior to Visit  Medication Sig Dispense Refill   amLODipine  (NORVASC ) 5 MG tablet Take 2 tablets (10 mg total) by mouth daily. (Patient taking differently: Take 5 mg by mouth every morning.) 30 tablet 1   aspirin  81 MG chewable tablet Chew 1 tablet (81 mg total) by mouth 2 (two) times daily.     augmented betamethasone dipropionate (DIPROLENE-AF) 0.05 % cream Apply 1 application  topically 2 (two) times daily as needed (Rash behind ear).     carvedilol  (COREG ) 3.125 MG tablet Take 2 tablets (6.25 mg total) by mouth 2 (two) times daily with a meal. (Patient taking differently: Take 3.125 mg by mouth 2 (two) times daily with a meal.) 60 tablet 0   celecoxib  (CELEBREX ) 200 MG capsule Take 1 capsule (200 mg total) by mouth 2 (two) times daily. 60 capsule 1   Coenzyme Q10 (CO Q-10 PO) Take 200 mg by mouth daily. PT takes with Co Q-10 400 mg     Coenzyme Q10-Fish Oil -Vit E (CO-Q 10 OMEGA-3 FISH OIL  PO) Take 400 mg by mouth daily. Pt takes with Co Q-10 200 mg     colchicine  0.6 MG tablet Take 0.6 mg by mouth daily as needed (Gout).     Continuous Glucose Receiver (FREESTYLE LIBRE 3 READER) DEVI 1 (ONE) EACH FOR CONTINUOUS GLUCOSE MONITORING     Continuous Glucose  Sensor (FREESTYLE LIBRE 3 SENSOR) MISC 1 (ONE) KIT APPLIED EVERY 14 DAYS FOR GLUCOSE MONITORING     ezetimibe  (ZETIA ) 10 MG tablet Take 1 tablet (10 mg total) by mouth daily. (Patient taking differently: Take 10 mg by mouth every morning.) 30 tablet 0   Flaxseed, Linseed, (FLAXSEED OIL PO) Take 1 tablet by mouth daily at 6 (six) AM.     glipiZIDE  (GLUCOTROL  XL) 10 MG 24 hr tablet Take 10 mg by mouth 2 (two) times daily.     ketoconazole (NIZORAL) 2 % shampoo Apply 1 Application topically daily as needed for irritation.     ketorolac  (ACULAR ) 0.5 % ophthalmic solution Place 1 drop into the right eye in the morning, at noon, and at bedtime. 5 mL 0   MANGANESE PO Take 1 tablet by mouth daily at 6 (six) AM.     Menaquinone-7 (VITAMIN K2 ) 100 MCG CAPS Take 1 capsule by mouth daily.     metFORMIN  (GLUCOPHAGE -XR) 500 MG 24 hr tablet Take 1,000 mg by mouth 2 (two) times daily with a meal.     nitroGLYCERIN  (NITROSTAT ) 0.4 MG SL tablet Take 0.4 mg by mouth every 5 (five) minutes as needed for chest pain.      Omega-3 Fatty Acids (OMEGA-3 FISH OIL  PO) Take 2,000 mg by mouth daily.  OVER THE COUNTER MEDICATION Take 2 capsules by mouth daily. Vision Shield Adult [employer]  with Lutein      REPATHA SURECLICK 140 MG/ML SOAJ Inject 140 mg into the skin every 14 (fourteen) days.     traMADol  (ULTRAM ) 50 MG tablet Take 2 tablets (100 mg total) by mouth daily as needed for moderate pain (pain score 4-6) or severe pain (pain score 7-10). 30 tablet 0   VASCEPA  1 g capsule Take 2 g by mouth 2 (two) times daily.     vitamin B-12 (CYANOCOBALAMIN ) 1000 MCG tablet Take 1,000 mcg by mouth daily.     Zinc  100 MG TABS Take 1 tablet by mouth daily at 6 (six) AM.     traMADol  (ULTRAM ) 50 MG tablet Take 1 tablet (50 mg total) by mouth 3 (three) times daily. 120 tablet 1   ciprofloxacin  (CILOXAN ) 0.3 % ophthalmic solution Place 2 drops into both eyes every 4 (four) hours while awake. Administer 1 drop, every 2 hours, while  awake, for 2 days. Then 1 drop, every 4 hours, while awake, for the next 5 days. (Patient not taking: Reported on 11/29/2023) 5 mL 0   oxyCODONE  (OXY IR/ROXICODONE ) 5 MG immediate release tablet Take 1 tablet (5 mg total) by mouth every 4 (four) hours as needed for moderate pain (pain score 4-6) (pain score 4-6). (Patient not taking: Reported on 11/29/2023) 30 tablet 0   No facility-administered medications prior to visit.    Review of Systems CNS: No confusion or sedation Cardiac: No angina or palpitations GI: No abdominal pain or constipation Constitutional: No nausea vomiting fevers or chills  Objective:  BP (!) 167/91   Pulse 80   Temp 98.3 F (36.8 C)   Resp 16   Ht 6' (1.829 m)   Wt 240 lb (108.9 kg)   SpO2 100%   BMI 32.55 kg/m    BP Readings from Last 3 Encounters:  11/29/23 (!) 167/91  10/26/23 107/66  09/04/23 (!) 130/56     Wt Readings from Last 3 Encounters:  11/29/23 240 lb (108.9 kg)  10/26/23 240 lb (108.9 kg)  08/27/23 240 lb 4.8 oz (109 kg)     Physical Exam Pt is alert and oriented PERRL EOMI HEART IS RRR no murmur or rub LCTA no wheezing or rales MUSCULOSKELETAL reveals some paraspinous muscle tenderness in the lumbar region with a well-healed midline scar.  He ambulates with an antalgic gait with a positive straight leg raise in the right side.  Muscle tone and bulk is at baseline.  Labs  Lab Results  Component Value Date   HGBA1C 6.6 (H) 08/27/2023   HGBA1C 8.2 (H) 12/17/2022   HGBA1C 8.1 (H) 12/20/2020   Lab Results  Component Value Date   LDLCALC NOT CALCULATED 12/18/2022   CREATININE 1.22 08/27/2023    -------------------------------------------------------------------------------------------------------------------- Lab Results  Component Value Date   WBC 6.1 08/27/2023   HGB 14.2 08/27/2023   HCT 41.4 08/27/2023   PLT 206 08/27/2023   GLUCOSE 158 (H) 08/27/2023   CHOL 60 12/18/2022   TRIG 245 (H) 12/18/2022   HDL 30 (L)  12/18/2022   LDLDIRECT 49.6 12/21/2020   LDLCALC NOT CALCULATED 12/18/2022   ALT 26 08/27/2023   AST 22 08/27/2023   NA 138 08/27/2023   K 4.4 08/27/2023   CL 104 08/27/2023   CREATININE 1.22 08/27/2023   BUN 27 (H) 08/27/2023   CO2 24 08/27/2023   INR 1.0 12/17/2022   HGBA1C 6.6 (H) 08/27/2023    ---------------------------------------------------------------------------------------------------------------------  DG PAIN CLINIC C-ARM 1-60 MIN NO REPORT Result Date: 11/29/2023 Fluoro was used, but no Radiologist interpretation will be provided. Please refer to "NOTES" tab for provider progress note.    Assessment & Plan:   Brekken was seen today for back pain.  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 -     triamcinolone  acetonide (KENALOG -40) injection 40 mg -     sodium chloride  flush (NS) 0.9 % injection 10 mL -     ropivacaine (PF) 2 mg/mL (0.2%) (NAROPIN) injection 10 mL -     lidocaine  (PF) (XYLOCAINE ) 1 % injection 5 mL -     iohexol  (OMNIPAQUE ) 180 MG/ML injection 10 mL        ----------------------------------------------------------------------------------------------------------------------  Problem List Items Addressed This Visit       Unprioritized   Chronic low  back pain (1ry area of Pain) (Bilateral) (R>L) w/ sciatica (Right) (Chronic)   Relevant Medications   traMADol  (ULTRAM ) 50 MG tablet   Chronic lumbar radicular pain (Right) (Chronic)   Relevant Medications   traMADol  (ULTRAM ) 50 MG tablet   Chronic pain syndrome (Chronic)   Relevant Medications   traMADol  (ULTRAM ) 50 MG tablet   Other Relevant Orders   ToxASSURE Select 13 (MW), Urine   Chronic radicular pain of lower extremity (Right) (Chronic)   Relevant Medications   traMADol  (ULTRAM ) 50 MG tablet   DDD (degenerative disc disease), lumbar (Chronic)   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)   Relevant Medications   traMADol  (ULTRAM ) 50 MG tablet   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 Chronic pain of lower extremity, bilateral Will proceed with a caudal epidural steroid injection to see if this can give him symptomatic relief of the right lower extremity sciatica and improve his back pain.  I have answered all his questions today.  He is instructed to return to gradual activity with stretching strengthening exercises and physical therapy modalities.  Will schedule him for return to clinic in 1 month for follow-up and possible repeat injection if indicated at that time. - Lumbar Epidural Injection  2. 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  Degeneration of intervertebral disc of lumbar region with discogenic back pain As above - Lumbar Epidural Injection  4. Failed back surgical syndrome (Primary) Continue tramadol  for symptomatic pain relief which is working well for him.  He denies any side effects with the medication.  5. Chronic bilateral low back pain with right-sided sciatica As above  6. Chronic radicular pain of lower extremity   7. Chronic pain syndrome I have reviewed the [place]  practitioner database information and its appropriate to proceed with renewal of his medication management. - ToxASSURE Select 13 (MW), Urine  8. Chronic lumbar radicular pain (Right) As above  9. Chronic use of opiate for therapeutic purpose As above and continue follow-up with his primary care physicians for baseline medical care. - ToxASSURE Select 13 (MW), Urine    ----------------------------------------------------------------------------------------------------------------------  I have changed Ernie Heal. Donalson Jr.'s traMADol . I am also having him maintain his nitroGLYCERIN , ezetimibe , cyanocobalamin , metFORMIN , Vascepa , augmented betamethasone dipropionate, colchicine , Repatha SureClick, carvedilol , amLODipine , FreeStyle Libre 3 Sensor, [employer] 3 Reader, ketoconazole, glipiZIDE , Coenzyme Q10 (CO Q-10 PO), Coenzyme Q10-Fish Oil -Vit E (CO-Q 10 OMEGA-3 FISH OIL  PO), (Flaxseed, Linseed, (FLAXSEED OIL PO)), Omega-3 Fatty Acids (OMEGA-3 FISH OIL  PO), Zinc , Vitamin K2 , MANGANESE PO, OVER THE COUNTER MEDICATION, aspirin , traMADol , celecoxib , oxyCODONE , ciprofloxacin , and ketorolac . We administered triamcinolone  acetonide, sodium chloride  flush, ropivacaine (PF) 2 mg/mL (0.2%), lidocaine  (PF), and iohexol .   Meds ordered this encounter  Medications   traMADol  (ULTRAM ) 50 MG tablet    Sig: Take 1 tablet (50 mg total) by mouth every 6 (six) hours as needed.    Dispense:  120 tablet    Refill:  2   triamcinolone   acetonide (KENALOG -40) injection 40 mg   sodium chloride  flush (NS) 0.9 % injection 10 mL   ropivacaine (PF) 2 mg/mL (0.2%) (NAROPIN) injection 10 mL   lidocaine  (PF) (XYLOCAINE ) 1 % injection 5 mL   iohexol  (OMNIPAQUE ) 180 MG/ML injection 10 mL   Patient's Medications  New Prescriptions   No medications on file  Previous Medications   AMLODIPINE  (NORVASC ) 5 MG TABLET    Take 2 tablets (10 mg total) by mouth daily.   ASPIRIN  81 MG CHEWABLE TABLET    Chew 1 tablet (81 mg total) by mouth 2 (two) times daily.   AUGMENTED BETAMETHASONE DIPROPIONATE (DIPROLENE-AF) 0.05 % CREAM    Apply 1 application  topically 2 (two) times daily as needed (Rash behind ear).   CARVEDILOL  (COREG ) 3.125 MG TABLET    Take 2 tablets (6.25 mg total) by mouth 2 (two) times daily with a meal.   CELECOXIB  (CELEBREX ) 200 MG CAPSULE    Take 1 capsule (200 mg total) by mouth 2 (two) times daily.   CIPROFLOXACIN  (CILOXAN ) 0.3 % OPHTHALMIC SOLUTION    Place 2 drops into both eyes every 4 (four) hours while awake. Administer 1 drop, every 2 hours, while awake, for 2 days. Then 1 drop, every 4 hours, while awake, for the next 5 days.   COENZYME Q10 (CO Q-10 PO)    Take 200 mg by mouth daily. PT takes with Co Q-10 400 mg   COENZYME Q10-FISH OIL -VIT E (CO-Q 10 OMEGA-3 FISH OIL  PO)    Take 400 mg by mouth daily. Pt takes with Co Q-10 200 mg   COLCHICINE  0.6 MG TABLET    Take 0.6 mg by mouth daily as needed (Gout).   CONTINUOUS GLUCOSE RECEIVER (FREESTYLE LIBRE 3 READER) DEVI  1 (ONE) EACH FOR CONTINUOUS GLUCOSE MONITORING   CONTINUOUS GLUCOSE SENSOR (FREESTYLE LIBRE 3 SENSOR) MISC    1 (ONE) KIT APPLIED EVERY 14 DAYS FOR GLUCOSE MONITORING   EZETIMIBE  (ZETIA ) 10 MG TABLET    Take 1 tablet (10 mg total) by mouth daily.   FLAXSEED, LINSEED, (FLAXSEED OIL PO)    Take 1 tablet by mouth daily at 6 (six) AM.   GLIPIZIDE  (GLUCOTROL  XL) 10 MG 24 HR TABLET    Take 10 mg by mouth 2 (two) times daily.   KETOCONAZOLE (NIZORAL) 2 % SHAMPOO     Apply 1 Application topically daily as needed for irritation.   KETOROLAC  (ACULAR ) 0.5 % OPHTHALMIC SOLUTION    Place 1 drop into the right eye in the morning, at noon, and at bedtime.   MANGANESE PO    Take 1 tablet by mouth daily at 6 (six) AM.   MENAQUINONE-7 (VITAMIN K2 ) 100 MCG CAPS    Take 1 capsule by mouth daily.   METFORMIN  (GLUCOPHAGE -XR) 500 MG 24 HR TABLET    Take 1,000 mg by mouth 2 (two) times daily with a meal.   NITROGLYCERIN  (NITROSTAT ) 0.4 MG SL TABLET    Take 0.4 mg by mouth every 5 (five) minutes as needed for chest pain.    OMEGA-3 FATTY ACIDS (OMEGA-3 FISH OIL  PO)    Take 2,000 mg by mouth daily.   OVER THE COUNTER MEDICATION    Take 2 capsules by mouth daily. Vision Shield Adult [employer]  with Lutein    OXYCODONE  (OXY IR/ROXICODONE ) 5 MG IMMEDIATE RELEASE TABLET    Take 1 tablet (5 mg total) by mouth every 4 (four) hours as needed for moderate pain (pain score 4-6) (pain score 4-6).   REPATHA SURECLICK 140 MG/ML SOAJ    Inject 140 mg into the skin every 14 (fourteen) days.   TRAMADOL  (ULTRAM ) 50 MG TABLET    Take 2 tablets (100 mg total) by mouth daily as needed for moderate pain (pain score 4-6) or severe pain (pain score 7-10).   VASCEPA  1 G CAPSULE    Take 2 g by mouth 2 (two) times daily.   VITAMIN B-12 (CYANOCOBALAMIN ) 1000 MCG TABLET    Take 1,000 mcg by mouth daily.   ZINC  100 MG TABS    Take 1 tablet by mouth daily at 6 (six) AM.  Modified Medications   Modified Medication Previous Medication   TRAMADOL  (ULTRAM ) 50 MG TABLET traMADol  (ULTRAM ) 50 MG tablet      Take 1 tablet (50 mg total) by mouth every 6 (six) hours as needed.    Take 1 tablet (50 mg total) by mouth 3 (three) times daily.  Discontinued Medications   No medications on file   ----------------------------------------------------------------------------------------------------------------------  Follow-up: Return in about 1 month (around 12/29/2023) for evaluation, med refill.  After informed consent  was obtained and the risks benefits reviewed patient chose to pursue a caudal epidural steroid injection today. With the patient in the prone position and an IV in place, sedation was with IV versed . Using AP and lateral fluoroscopic guidance I identified the area overlying the sacral hiatus. This area was broadly prepped with DuraPrep 3 and we utilized strict aseptic technique during the procedure. 1% lidocaine  was infiltrated 2 cc using a 25-gauge needle overlying the sacral cornu and then using lateral fluoroscopic guidance I advanced an 18-gauge Touhy needle approximately 2 cm through the sacral hiatus. Confirmation was with 2 cc of contrast dye yielding good epidural spread and  no evidence of IV or subarachnoid uptake. This was followed by an injection of 5 cc of saline mixed with 1 cc of ropivacaine 0.2% and 40 mg of triamcinolone . This was tolerated without difficulty the patient was convalesced and discharged home in stable condition for follow-up as mentioned. Leata Providence, MD

## 2023-12-02 LAB — TOXASSURE SELECT 13 (MW), URINE

## 2024-02-01 ENCOUNTER — Other Ambulatory Visit (INDEPENDENT_AMBULATORY_CARE_PROVIDER_SITE_OTHER): Payer: Self-pay | Admitting: Nurse Practitioner

## 2024-02-01 DIAGNOSIS — I6523 Occlusion and stenosis of bilateral carotid arteries: Secondary | ICD-10-CM

## 2024-02-03 ENCOUNTER — Ambulatory Visit (INDEPENDENT_AMBULATORY_CARE_PROVIDER_SITE_OTHER): Payer: Medicare HMO | Admitting: Nurse Practitioner

## 2024-02-03 ENCOUNTER — Encounter (INDEPENDENT_AMBULATORY_CARE_PROVIDER_SITE_OTHER): Payer: Self-pay | Admitting: Nurse Practitioner

## 2024-02-03 ENCOUNTER — Ambulatory Visit (INDEPENDENT_AMBULATORY_CARE_PROVIDER_SITE_OTHER): Payer: Medicare HMO

## 2024-02-03 VITALS — BP 123/75 | HR 93 | Resp 18 | Wt 234.8 lb

## 2024-02-03 DIAGNOSIS — I10 Essential (primary) hypertension: Secondary | ICD-10-CM | POA: Diagnosis not present

## 2024-02-03 DIAGNOSIS — E1129 Type 2 diabetes mellitus with other diabetic kidney complication: Secondary | ICD-10-CM | POA: Diagnosis not present

## 2024-02-03 DIAGNOSIS — I6523 Occlusion and stenosis of bilateral carotid arteries: Secondary | ICD-10-CM

## 2024-02-06 NOTE — Progress Notes (Signed)
 Subjective:    Patient ID: Aaron [name]., male    DOB: [birth year], 78 y.o.   MRN: [id number] Chief Complaint  Patient presents with   Follow-up    6 month carotid follow up    The patient is seen for follow up evaluation of carotid stenosis. The carotid stenosis followed by ultrasound.   The patient denies amaurosis fugax. There is no recent history of TIA symptoms or focal motor deficits. There is no prior documented CVA.  The patient is taking enteric-coated aspirin  81 mg daily.  There is no history of migraine headaches. There is no history of seizures.  No recent shortening of the patient's walking distance or new symptoms consistent with claudication.  No history of rest pain symptoms. No new ulcers or wounds of the lower extremities have occurred.  There is no history of DVT, PE or superficial thrombophlebitis. No documented recent episodes of angina or shortness of breath documented.   Carotid Duplex done today shows 40 to 59% stenosis of the right ICA with 1 to 39% stenosis of the left.  This is different from the previous studies however in lab with the studies done just before that which showed a greater than 50% stenosis of the right ICA.  These were also done at an outside facility.    Review of Systems  All other systems reviewed and are negative.      Objective:   Physical Exam Vitals reviewed.  HENT:     Head: Normocephalic.  Neck:     Vascular: No carotid bruit.  Cardiovascular:     Rate and Rhythm: Normal rate.     Pulses: Normal pulses.  Pulmonary:     Effort: Pulmonary effort is normal.  Skin:    General: Skin is warm and dry.  Neurological:     Mental Status: He is alert and oriented to person, place, and time.  Psychiatric:        Mood and Affect: Mood normal.        Behavior: Behavior normal.        Thought Content: Thought content normal.        Judgment: Judgment normal.     BP 123/75   Pulse 93   Resp 18   Wt 234 lb 12.8 oz  (106.5 kg)   BMI 31.84 kg/m   Past Medical History:  Diagnosis Date   Bilateral carotid artery stenosis    Cellulitis    Chronic pain syndrome    CKD (chronic kidney disease) stage 3, GFR 30-59 ml/min (HCC)    Complication of anesthesia    a.) anesthesia awareness during back surgery; b.) PONV (years ago)   COPD (chronic obstructive pulmonary disease) (HCC)    Coronary artery disease 2002   a.) PCI 2002: stent (unk type) mLAD: b.) PCI 10/21/15: 95 mRCA (3.5x28 Xience Alpine DES); c.) LHC 10/07/16: MV CAD, 15 ISR mLAD, 10 ISR mRCA; d.) PCI 06/13/19: 99 dRCA (2.5x15 mm Res Onyx DES), 15 ISR mLAD; e.) PCI 12/20/20: 80 p-mLAD (3.0x12 Res Onyx DES), 60 p-mLCx (2.75x34 Res Onyx), 99 m-dLCx (covered by p-mLCx DES); f.) LHC 03/09/2023: 50 pRCA, 50 dRCA, 30 p-mLCx, 50 OM1, 40 mLAD - med mgmt   DDD (degenerative disc disease), lumbar    Diabetic peripheral neuropathy (HCC)    DM (diabetes mellitus), type 2 (HCC)    DOE (dyspnea on exertion)    Duodenal ulcer    Failed back surgical syndrome    Family history of adverse  reaction to anesthesia    a.) father with (+) delayed/prolonged emergence   Former smoker    GERD (gastroesophageal reflux disease)    Gout    H/O unstable angina    HFrEF (heart failure with reduced ejection fraction) (HCC)    History of COVID-19 11/2020   History of heart artery stent    a.) TOTAL OF 5 stents as of 09/01/2023   Hyperlipidemia    Hypertension    Ischemic cardiomyopathy    a.) MV 10/17/15: EF 48%; b.) LHC 10/21/15: EF 50%;  c.) stress TTE 09/29/16: EF 50%; d.) LHC 10/07/16: EF 45%; e.) MV 09/06/2018: EF 42%;  f.) LHC 06/13/19: EF 40%;  g.) TTE 01/29/20: EF 40%; h.) TTE 07/22/20: EF 40%; i.) MV 07/22/20: EF 49%;  j.) LHC 12/20/20: EF 45-50%;  k.) TTE 10/01/21: EF 40%;  l.) MV 10/01/21: EF 39%;  m.) MV 01/05/23: EF 45-50%;  n.) TTE 01/05/23: EF >55%; o.) LHC 03/09/23: EF 35-45%   Long term current use of aspirin     NSTEMI (non-ST elevated myocardial infarction) (HCC) 06/12/2019    a.) troponins were trended: 169 --> 1,013 --> 5,254 --> 8,718 ng/L; b.) LHC/PCI 06/13/2019: 15% ISR mLAD, 90% D1, 25% o-pLCx, 60% m-dLCx, 70% p-mLCx, 10% ISR mRCA, 99% dRCA (2.5 x 15 mm Resolute Onyx DES)   NSTEMI (non-ST elevated myocardial infarction) (HCC) 12/20/2020   a.) troponins were trended: 141 -- 4,261 -- >24,000 -- >24,000 -- 16,649 ng/L; b.) PCI 12/20/2020: 20% m-dLM, 30% oLAD, 10% ISR mLAD, 30% m-dLAD, 60% p-mLCx (2.75 x 24 mm Resolute Onyx DES), 99% m-dLCx (covered by p-mLCx DES), 80% p-mLAD (3.0 x 12 mm Resolute Onyx DES), 50% pRCA, 10% ISR p-mRCA   Osteoarthritis    Palpitations (PACs/PVCs)    Perforated intestine (HCC)    PONV (postoperative nausea and vomiting)    Rotator cuff tendonitis    Sleep apnea    a.) no nocturnal PAP therapy   Statin intolerance     Social History   Socioeconomic History   Marital status: Widowed    Spouse name: Not on file   Number of children: Not on file   Years of education: Not on file   Highest education level: Not on file  Occupational History   Not on file  Tobacco Use   Smoking status: Former    Types: Cigarettes   Smokeless tobacco: Former   Tobacco comments:    7/20 quit 35 years ago  Vaping Use   Vaping status: Never Used  Substance and Sexual Activity   Alcohol use: No   Drug use: No   Sexual activity: Not on file  Other Topics Concern   Not on file  Social History Narrative   Not on file   Social Drivers of Health   Financial Resource Strain: Low Risk  (05/06/2020)   Received from [facility]   Overall Financial Resource Strain (CARDIA)    Difficulty of Paying Living Expenses: Not hard at all  Food Insecurity: No Food Insecurity (09/03/2023)   Hunger Vital Sign    Worried About Running Out of Food in the Last Year: Never true    Ran Out of Food in the Last Year: Never true  Transportation Needs: No Transportation Needs (09/03/2023)   PRAPARE - Administrator, Civil Service (Medical): No     Lack of Transportation (Non-Medical): No  Physical Activity: Insufficiently Active (05/06/2020)   Received [facility]   Exercise Vital Sign  On average, how many days per week do you engage in moderate to strenuous exercise (like a brisk walk)?: 4 days    On average, how many minutes do you engage in exercise at this level?: 10 min  Stress: No Stress Concern Present (05/06/2020)   Received from [facility] - Occupational Stress Questionnaire    Feeling of Stress : Not at all  Social Connections: Moderately Isolated (05/06/2020)   Received from [facility]   Social Connection and Isolation Panel    In a typical week, how many times do you talk on the phone with family, friends, or neighbors?: More than three times a week    How often do you get together with friends or relatives?: More than three times a week    How often do you attend church or religious services?: Never    Do you belong to any clubs or organizations such as church groups, unions, fraternal or athletic groups, or school groups?: No    How often do you attend meetings of the clubs or organizations you belong to?: Never    Are you married, widowed, divorced, separated, never married, or living with a partner?: Married  Intimate Partner Violence: Not At Risk (09/03/2023)   Humiliation, Afraid, Rape, and Kick questionnaire    Fear of Current or Ex-Partner: No    Emotionally Abused: No    Physically Abused: No    Sexually Abused: No    Past Surgical History:  Procedure Laterality Date   ABDOMINAL SURGERY     perforated intestine   APPENDECTOMY     BACK SURGERY     Lumbar x3   CARDIAC CATHETERIZATION Left 10/21/2015   Procedure: Left Heart Cath and Coronary Angiography;  Surgeon: Wolm JINNY Rhyme, MD;  Location: [facility];  Service: Cardiovascular;  Laterality: Left;   CARDIAC CATHETERIZATION N/A 10/21/2015   Procedure: Coronary Stent  Intervention;  Surgeon: Marsa Dooms, MD;  Location: [facility];  Service: Cardiovascular;  Laterality: N/A;   CHOLECYSTECTOMY     COLONOSCOPY     CORONARY ANGIOPLASTY WITH STENT PLACEMENT Left 2002   CORONARY STENT INTERVENTION N/A 06/13/2019   Procedure: CORONARY STENT INTERVENTION;  Surgeon: Dooms Marsa, MD;  Location: [facility];  Service: Cardiovascular;  Laterality: N/A;   CORONARY STENT INTERVENTION N/A 12/20/2020   Procedure: CORONARY STENT INTERVENTION;  Surgeon: Darron Deatrice LABOR, MD;  Location: [facility];  Service: Cardiovascular;  Laterality: N/A;   KNEE ARTHROPLASTY Right 09/03/2023   Procedure: ARTHROPLASTY, KNEE, TOTAL, USING IMAGELESS COMPUTER-ASSISTED NAVIGATION;  Surgeon: Mardee Lynwood SQUIBB, MD;  Location: [facility];  Service: Orthopedics;  Laterality: Right;   LEFT HEART CATH AND CORONARY ANGIOGRAPHY Left 10/07/2016   Procedure: Left Heart Cath and Coronary Angiography;  Surgeon: Wolm JINNY Rhyme, MD;  Location: [facility];  Service: Cardiovascular;  Laterality: Left;   LEFT HEART CATH AND CORONARY ANGIOGRAPHY N/A 06/13/2019   Procedure: LEFT HEART CATH AND CORONARY ANGIOGRAPHY;  Surgeon: Rhyme Wolm JINNY, MD;  Location: [facility];  Service: Cardiovascular;  Laterality: N/A;   LEFT HEART CATH AND CORONARY ANGIOGRAPHY N/A 12/20/2020   Procedure: LEFT HEART CATH AND CORONARY ANGIOGRAPHY;  Surgeon: Darron Deatrice LABOR, MD;  Location: [facility];  Service: Cardiovascular;  Laterality: N/A;   LEFT HEART CATH AND CORONARY ANGIOGRAPHY Left 03/09/2023   Procedure: LEFT HEART CATH AND CORONARY ANGIOGRAPHY;  Surgeon: Dooms Marsa, MD;  Location: [facility];  Service: Cardiovascular;  Laterality: Left;   SHOULDER ARTHROSCOPY WITH SUBACROMIAL DECOMPRESSION AND BICEP TENDON REPAIR Right 02/24/2018   Procedure: SHOULDER ARTHROSCOPY WITH SUBACROMIAL DEBRIDEMENT, DECOMPRESSION, ROTATOR CUFF REPAIR AND  POSSIBLE BICEP TENODESIS;  Surgeon: Edie Norleen PARAS, MD;  Location: [facility];  Service: Orthopedics;  Laterality: Right;   SHOULDER ARTHROSCOPY WITH SUBACROMIAL DECOMPRESSION AND BICEP TENDON REPAIR Left 04/25/2019   Procedure: SHOULDER ARTHROSCOPY WITH DEBRIDEMENT, DECOMPRESSION AND BICEP TENDON REPAIR, ROTATOR CUFF REPAIR;  Surgeon: Edie Norleen PARAS, MD;  Location: [facility];  Service: Orthopedics;  Laterality: Left;   TONSILLECTOMY      Family History  Problem Relation Age of Onset   Angina Mother     Allergies  Allergen Reactions   Ace Inhibitors Other (See Comments)    weakness   Isosorbide     weakness   Losartan      weakness   Metoprolol Tartrate Other (See Comments)    weakness   Statins Nausea And Vomiting and Other (See Comments)    Patient states he gets real weak and headache.   Sulfa Antibiotics Other (See Comments)    Unknown childhood reaction        Latest Ref Rng & Units 08/27/2023    3:55 PM 12/18/2022    4:15 AM 12/16/2022    8:34 PM  CBC  WBC 4.0 - 10.5 K/uL 6.1  8.7  8.8   Hemoglobin 13.0 - 17.0 g/dL 85.7  87.0  86.6   Hematocrit 39.0 - 52.0 % 41.4  39.2  39.5   Platelets 150 - 400 K/uL 206  185  207       CMP     Component Value Date/Time   NA 138 08/27/2023 1555   NA 133 (L) 09/26/2014 0622   K 4.4 08/27/2023 1555   K 3.8 09/26/2014 0622   CL 104 08/27/2023 1555   CL 99 (L) 09/26/2014 0622   CO2 24 08/27/2023 1555   CO2 26 09/26/2014 0622   GLUCOSE 158 (H) 08/27/2023 1555   GLUCOSE 162 (H) 09/26/2014 0622   BUN 27 (H) 08/27/2023 1555   BUN 11 09/26/2014 0622   CREATININE 1.22 08/27/2023 1555   CREATININE 0.96 09/26/2014 0622   CALCIUM 10.0 08/27/2023 1555   CALCIUM 8.1 (L) 09/26/2014 0622   PROT 7.7 08/27/2023 1555   ALBUMIN 4.5 08/27/2023 1555   AST 22 08/27/2023 1555   ALT 26 08/27/2023 1555   ALKPHOS 50 08/27/2023 1555   BILITOT 0.9 08/27/2023 1555   GFRNONAA >60 08/27/2023 1555   GFRNONAA >60 09/26/2014 0622     No results  found.     Assessment & Plan:   1. Bilateral carotid artery stenosis (Primary) The patient study showed high velocities which are consistent with his studies prior to this.  Given the discordance between studies, we   Aaron have the patient return in 6 months again and if it remains stable we Aaron plan moving to 1 year follow-up.  He Aaron continue with aspirin  and Repatha.  2. Benign essential HTN Continue antihypertensive medications as already ordered, these medications have been reviewed and there are no changes at this time.  3. 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  Hgb A1C to be monitored as already arranged by primary service   Current Outpatient Medications on File  Prior to Visit  Medication Sig Dispense Refill   amLODipine  (NORVASC ) 5 MG tablet Take 2 tablets (10 mg total) by mouth daily. (Patient taking differently: Take 5 mg by mouth every morning.) 30 tablet 1   aspirin  81 MG chewable tablet Chew 1 tablet (81 mg total) by mouth 2 (two) times daily.     augmented betamethasone dipropionate (DIPROLENE-AF) 0.05 % cream Apply 1 application  topically 2 (two) times daily as needed (Rash behind ear).     carvedilol  (COREG ) 3.125 MG tablet Take 2 tablets (6.25 mg total) by mouth 2 (two) times daily with a meal. (Patient taking differently: Take 3.125 mg by mouth 2 (two) times daily with a meal.) 60 tablet 0   celecoxib  (CELEBREX ) 200 MG capsule Take 1 capsule (200 mg total) by mouth 2 (two) times daily. 60 capsule 1   Coenzyme Q10 (CO Q-10 PO) Take 200 mg by mouth daily. PT takes with Co Q-10 400 mg     Coenzyme Q10-Fish Oil -Vit E (CO-Q 10 OMEGA-3 FISH OIL  PO) Take 400 mg by mouth daily. Pt takes with Co Q-10 200 mg     colchicine  0.6 MG tablet Take 0.6 mg by mouth daily as needed (Gout).      Continuous Glucose Receiver (FREESTYLE LIBRE 3 READER) DEVI 1 (ONE) EACH FOR CONTINUOUS GLUCOSE MONITORING     Continuous Glucose Sensor (FREESTYLE LIBRE 3 SENSOR) MISC 1 (ONE) KIT APPLIED EVERY 14 DAYS FOR GLUCOSE MONITORING     ezetimibe  (ZETIA ) 10 MG tablet Take 1 tablet (10 mg total) by mouth daily. (Patient taking differently: Take 10 mg by mouth every morning.) 30 tablet 0   Flaxseed, Linseed, (FLAXSEED OIL PO) Take 1 tablet by mouth daily at 6 (six) AM.     glipiZIDE  (GLUCOTROL  XL) 10 MG 24 hr tablet Take 10 mg by mouth 2 (two) times daily.     ketoconazole (NIZORAL) 2 % shampoo Apply 1 Application topically daily as needed for irritation.     ketorolac  (ACULAR ) 0.5 % ophthalmic solution Place 1 drop into the right eye in the morning, at noon, and at bedtime. 5 mL 0   MANGANESE PO Take 1 tablet by mouth daily at 6 (six) AM.     Menaquinone-7 (VITAMIN K2 ) 100 MCG CAPS Take 1 capsule by mouth daily.     metFORMIN  (GLUCOPHAGE -XR) 500 MG 24 hr tablet Take 1,000 mg by mouth 2 (two) times daily with a meal.     nitroGLYCERIN  (NITROSTAT ) 0.4 MG SL tablet Take 0.4 mg by mouth every 5 (five) minutes as needed for chest pain.      Omega-3 Fatty Acids (OMEGA-3 FISH OIL  PO) Take 2,000 mg by mouth daily.     OVER THE COUNTER MEDICATION Take 2 capsules by mouth daily. Vision Shield Adult Eye Support with Lutein      REPATHA SURECLICK 140 MG/ML SOAJ Inject 140 mg into the skin every 14 (fourteen) days.     traMADol  (ULTRAM ) 50 MG tablet Take 2 tablets (100 mg total) by mouth daily as needed for moderate pain (pain score 4-6) or severe pain (pain score 7-10). 30 tablet 0   traMADol  (ULTRAM ) 50 MG tablet Take 1 tablet (50 mg total) by mouth every 6 (six) hours as needed. 120 tablet 2   VASCEPA  1 g capsule Take 2 g by mouth 2 (two) times daily.     vitamin B-12 (CYANOCOBALAMIN ) 1000 MCG tablet Take 1,000 mcg by mouth daily.     Zinc  100 MG TABS Take  1 tablet by mouth daily at 6 (six) AM.     ciprofloxacin   (CILOXAN ) 0.3 % ophthalmic solution Place 2 drops into both eyes every 4 (four) hours while awake. Administer 1 drop, every 2 hours, while awake, for 2 days. Then 1 drop, every 4 hours, while awake, for the next 5 days. (Patient not taking: Reported on 02/03/2024) 5 mL 0   oxyCODONE  (OXY IR/ROXICODONE ) 5 MG immediate release tablet Take 1 tablet (5 mg total) by mouth every 4 (four) hours as needed for moderate pain (pain score 4-6) (pain score 4-6). (Patient not taking: Reported on 02/03/2024) 30 tablet 0   No current facility-administered medications on file prior to visit.    There are no Patient Instructions on file for this visit. No follow-ups on file.   Lataisha Colan E Antinette Keough, NP

## 2024-02-07 ENCOUNTER — Encounter (INDEPENDENT_AMBULATORY_CARE_PROVIDER_SITE_OTHER): Payer: Self-pay | Admitting: Nurse Practitioner

## 2024-06-25 ENCOUNTER — Other Ambulatory Visit: Payer: Self-pay | Admitting: Anesthesiology

## 2024-08-08 ENCOUNTER — Ambulatory Visit (INDEPENDENT_AMBULATORY_CARE_PROVIDER_SITE_OTHER): Admitting: Vascular Surgery

## 2024-08-08 ENCOUNTER — Encounter (INDEPENDENT_AMBULATORY_CARE_PROVIDER_SITE_OTHER)
